# Patient Record
Sex: Female | Born: 1945 | Race: White | Hispanic: No | Smoking: Current every day smoker
Health system: Southern US, Community
[De-identification: ages and names within clinical notes are randomized; demographics above are authoritative.]

## PROBLEM LIST (undated history)

## (undated) DIAGNOSIS — I5189 Other ill-defined heart diseases: Secondary | ICD-10-CM

## (undated) DIAGNOSIS — F11921 Opioid use, unspecified with intoxication delirium: Secondary | ICD-10-CM

## (undated) DIAGNOSIS — I2699 Other pulmonary embolism without acute cor pulmonale: Secondary | ICD-10-CM

## (undated) DIAGNOSIS — R9431 Abnormal electrocardiogram [ECG] [EKG]: Secondary | ICD-10-CM

## (undated) DIAGNOSIS — IMO0002 Reserved for concepts with insufficient information to code with codable children: Secondary | ICD-10-CM

## (undated) DIAGNOSIS — M810 Age-related osteoporosis without current pathological fracture: Secondary | ICD-10-CM

## (undated) DIAGNOSIS — I10 Essential (primary) hypertension: Secondary | ICD-10-CM

## (undated) DIAGNOSIS — K7460 Unspecified cirrhosis of liver: Secondary | ICD-10-CM

## (undated) DIAGNOSIS — K8020 Calculus of gallbladder without cholecystitis without obstruction: Secondary | ICD-10-CM

## (undated) DIAGNOSIS — E079 Disorder of thyroid, unspecified: Secondary | ICD-10-CM

## (undated) DIAGNOSIS — E876 Hypokalemia: Secondary | ICD-10-CM

## (undated) DIAGNOSIS — Z720 Tobacco use: Secondary | ICD-10-CM

## (undated) DIAGNOSIS — E119 Type 2 diabetes mellitus without complications: Secondary | ICD-10-CM

## (undated) DIAGNOSIS — D649 Anemia, unspecified: Secondary | ICD-10-CM

## (undated) DIAGNOSIS — I341 Nonrheumatic mitral (valve) prolapse: Secondary | ICD-10-CM

## (undated) DIAGNOSIS — E785 Hyperlipidemia, unspecified: Secondary | ICD-10-CM

## (undated) DIAGNOSIS — R0902 Hypoxemia: Secondary | ICD-10-CM

## (undated) HISTORY — PX: THYROID SURGERY: SHX805

---

## 2001-07-08 ENCOUNTER — Ambulatory Visit (HOSPITAL_COMMUNITY): Admission: RE | Admit: 2001-07-08 | Discharge: 2001-07-08 | Payer: Self-pay

## 2005-03-15 ENCOUNTER — Ambulatory Visit (HOSPITAL_COMMUNITY): Admission: RE | Admit: 2005-03-15 | Discharge: 2005-03-15 | Payer: Self-pay | Admitting: Family Medicine

## 2005-04-02 ENCOUNTER — Ambulatory Visit (HOSPITAL_COMMUNITY): Admission: RE | Admit: 2005-04-02 | Discharge: 2005-04-02 | Payer: Self-pay | Admitting: Family Medicine

## 2006-06-12 ENCOUNTER — Ambulatory Visit (HOSPITAL_COMMUNITY): Admission: RE | Admit: 2006-06-12 | Discharge: 2006-06-12 | Payer: Self-pay | Admitting: Family Medicine

## 2007-10-19 ENCOUNTER — Ambulatory Visit (HOSPITAL_COMMUNITY): Admission: RE | Admit: 2007-10-19 | Discharge: 2007-10-19 | Payer: Self-pay | Admitting: Family Medicine

## 2008-06-03 ENCOUNTER — Ambulatory Visit (HOSPITAL_COMMUNITY): Admission: RE | Admit: 2008-06-03 | Discharge: 2008-06-03 | Payer: Self-pay | Admitting: Family Medicine

## 2008-12-30 ENCOUNTER — Ambulatory Visit (HOSPITAL_COMMUNITY): Admission: RE | Admit: 2008-12-30 | Discharge: 2008-12-30 | Payer: Self-pay | Admitting: Family Medicine

## 2009-01-06 ENCOUNTER — Ambulatory Visit (HOSPITAL_COMMUNITY): Admission: RE | Admit: 2009-01-06 | Discharge: 2009-01-06 | Payer: Self-pay | Admitting: Family Medicine

## 2009-02-24 ENCOUNTER — Ambulatory Visit (HOSPITAL_COMMUNITY): Admission: RE | Admit: 2009-02-24 | Discharge: 2009-02-24 | Payer: Self-pay | Admitting: Family Medicine

## 2009-03-02 ENCOUNTER — Encounter (HOSPITAL_COMMUNITY): Admission: RE | Admit: 2009-03-02 | Discharge: 2009-04-01 | Payer: Self-pay | Admitting: Family Medicine

## 2011-01-13 ENCOUNTER — Encounter: Payer: Self-pay | Admitting: General Practice

## 2012-01-30 ENCOUNTER — Other Ambulatory Visit (HOSPITAL_COMMUNITY): Payer: Self-pay | Admitting: Physician Assistant

## 2012-01-30 DIAGNOSIS — N6320 Unspecified lump in the left breast, unspecified quadrant: Secondary | ICD-10-CM

## 2012-02-12 ENCOUNTER — Other Ambulatory Visit (HOSPITAL_COMMUNITY): Payer: Self-pay | Admitting: Physician Assistant

## 2012-02-12 ENCOUNTER — Encounter (HOSPITAL_COMMUNITY): Payer: Self-pay

## 2012-02-12 ENCOUNTER — Ambulatory Visit (HOSPITAL_COMMUNITY)
Admission: RE | Admit: 2012-02-12 | Discharge: 2012-02-12 | Disposition: A | Discharge status: Home / Self Care | Payer: Medicare Other | Source: Ambulatory Visit | Attending: Physician Assistant | Admitting: Physician Assistant

## 2012-02-12 DIAGNOSIS — N63 Unspecified lump in unspecified breast: Secondary | ICD-10-CM | POA: Insufficient documentation

## 2012-02-12 DIAGNOSIS — N6320 Unspecified lump in the left breast, unspecified quadrant: Secondary | ICD-10-CM

## 2012-12-11 ENCOUNTER — Other Ambulatory Visit (HOSPITAL_COMMUNITY): Payer: Self-pay | Admitting: Physician Assistant

## 2012-12-11 DIAGNOSIS — R609 Edema, unspecified: Secondary | ICD-10-CM

## 2012-12-14 ENCOUNTER — Ambulatory Visit (HOSPITAL_COMMUNITY)
Admission: RE | Admit: 2012-12-14 | Discharge: 2012-12-14 | Disposition: A | Discharge status: Home / Self Care | Payer: Medicare Other | Source: Ambulatory Visit | Attending: Physician Assistant | Admitting: Physician Assistant

## 2012-12-14 DIAGNOSIS — R609 Edema, unspecified: Secondary | ICD-10-CM

## 2012-12-23 DIAGNOSIS — K8020 Calculus of gallbladder without cholecystitis without obstruction: Secondary | ICD-10-CM

## 2012-12-23 HISTORY — DX: Calculus of gallbladder without cholecystitis without obstruction: K80.20

## 2013-08-24 ENCOUNTER — Other Ambulatory Visit (HOSPITAL_COMMUNITY): Payer: Self-pay | Admitting: Physician Assistant

## 2013-08-24 DIAGNOSIS — R7989 Other specified abnormal findings of blood chemistry: Secondary | ICD-10-CM

## 2013-09-22 DIAGNOSIS — I341 Nonrheumatic mitral (valve) prolapse: Secondary | ICD-10-CM

## 2013-09-22 DIAGNOSIS — I5189 Other ill-defined heart diseases: Secondary | ICD-10-CM

## 2013-09-22 HISTORY — DX: Nonrheumatic mitral (valve) prolapse: I34.1

## 2013-09-22 HISTORY — DX: Other ill-defined heart diseases: I51.89

## 2013-10-06 ENCOUNTER — Encounter (HOSPITAL_COMMUNITY): Payer: Self-pay | Admitting: Emergency Medicine

## 2013-10-06 ENCOUNTER — Inpatient Hospital Stay (HOSPITAL_COMMUNITY)
Admission: EM | Admit: 2013-10-06 | Discharge: 2013-10-08 | DRG: 640 | Disposition: A | Discharge status: Home / Self Care | Payer: Medicare PPO | Attending: Internal Medicine | Admitting: Internal Medicine

## 2013-10-06 ENCOUNTER — Emergency Department (HOSPITAL_COMMUNITY): Payer: Medicare PPO

## 2013-10-06 ENCOUNTER — Inpatient Hospital Stay (HOSPITAL_COMMUNITY): Payer: Medicare PPO

## 2013-10-06 DIAGNOSIS — I10 Essential (primary) hypertension: Secondary | ICD-10-CM | POA: Diagnosis present

## 2013-10-06 DIAGNOSIS — G9340 Encephalopathy, unspecified: Secondary | ICD-10-CM | POA: Diagnosis present

## 2013-10-06 DIAGNOSIS — E785 Hyperlipidemia, unspecified: Secondary | ICD-10-CM | POA: Diagnosis present

## 2013-10-06 DIAGNOSIS — IMO0002 Reserved for concepts with insufficient information to code with codable children: Secondary | ICD-10-CM | POA: Diagnosis present

## 2013-10-06 DIAGNOSIS — I519 Heart disease, unspecified: Secondary | ICD-10-CM | POA: Diagnosis present

## 2013-10-06 DIAGNOSIS — F17200 Nicotine dependence, unspecified, uncomplicated: Secondary | ICD-10-CM | POA: Diagnosis present

## 2013-10-06 DIAGNOSIS — K7689 Other specified diseases of liver: Secondary | ICD-10-CM | POA: Diagnosis present

## 2013-10-06 DIAGNOSIS — K8020 Calculus of gallbladder without cholecystitis without obstruction: Secondary | ICD-10-CM | POA: Diagnosis present

## 2013-10-06 DIAGNOSIS — G894 Chronic pain syndrome: Secondary | ICD-10-CM | POA: Diagnosis present

## 2013-10-06 DIAGNOSIS — R739 Hyperglycemia, unspecified: Secondary | ICD-10-CM | POA: Diagnosis present

## 2013-10-06 DIAGNOSIS — R9431 Abnormal electrocardiogram [ECG] [EKG]: Secondary | ICD-10-CM | POA: Diagnosis present

## 2013-10-06 DIAGNOSIS — J9600 Acute respiratory failure, unspecified whether with hypoxia or hypercapnia: Secondary | ICD-10-CM | POA: Diagnosis present

## 2013-10-06 DIAGNOSIS — R531 Weakness: Secondary | ICD-10-CM | POA: Diagnosis present

## 2013-10-06 DIAGNOSIS — E876 Hypokalemia: Principal | ICD-10-CM | POA: Diagnosis present

## 2013-10-06 DIAGNOSIS — E119 Type 2 diabetes mellitus without complications: Secondary | ICD-10-CM | POA: Diagnosis present

## 2013-10-06 DIAGNOSIS — R7401 Elevation of levels of liver transaminase levels: Secondary | ICD-10-CM | POA: Diagnosis present

## 2013-10-06 DIAGNOSIS — M1990 Unspecified osteoarthritis, unspecified site: Secondary | ICD-10-CM | POA: Diagnosis present

## 2013-10-06 DIAGNOSIS — T502X5A Adverse effect of carbonic-anhydrase inhibitors, benzothiadiazides and other diuretics, initial encounter: Secondary | ICD-10-CM | POA: Diagnosis present

## 2013-10-06 DIAGNOSIS — R5381 Other malaise: Secondary | ICD-10-CM

## 2013-10-06 DIAGNOSIS — I5189 Other ill-defined heart diseases: Secondary | ICD-10-CM

## 2013-10-06 DIAGNOSIS — M810 Age-related osteoporosis without current pathological fracture: Secondary | ICD-10-CM | POA: Diagnosis present

## 2013-10-06 DIAGNOSIS — J449 Chronic obstructive pulmonary disease, unspecified: Secondary | ICD-10-CM | POA: Diagnosis present

## 2013-10-06 DIAGNOSIS — R4182 Altered mental status, unspecified: Secondary | ICD-10-CM | POA: Diagnosis present

## 2013-10-06 DIAGNOSIS — J4489 Other specified chronic obstructive pulmonary disease: Secondary | ICD-10-CM | POA: Diagnosis present

## 2013-10-06 DIAGNOSIS — T40605A Adverse effect of unspecified narcotics, initial encounter: Secondary | ICD-10-CM | POA: Diagnosis present

## 2013-10-06 DIAGNOSIS — Z720 Tobacco use: Secondary | ICD-10-CM | POA: Diagnosis present

## 2013-10-06 DIAGNOSIS — E873 Alkalosis: Secondary | ICD-10-CM | POA: Diagnosis present

## 2013-10-06 DIAGNOSIS — R7402 Elevation of levels of lactic acid dehydrogenase (LDH): Secondary | ICD-10-CM | POA: Diagnosis present

## 2013-10-06 DIAGNOSIS — Z79899 Other long term (current) drug therapy: Secondary | ICD-10-CM

## 2013-10-06 DIAGNOSIS — R51 Headache: Secondary | ICD-10-CM | POA: Diagnosis present

## 2013-10-06 DIAGNOSIS — R0902 Hypoxemia: Secondary | ICD-10-CM | POA: Diagnosis present

## 2013-10-06 HISTORY — DX: Essential (primary) hypertension: I10

## 2013-10-06 HISTORY — DX: Reserved for concepts with insufficient information to code with codable children: IMO0002

## 2013-10-06 HISTORY — DX: Abnormal electrocardiogram (ECG) (EKG): R94.31

## 2013-10-06 HISTORY — DX: Hypokalemia: E87.6

## 2013-10-06 HISTORY — DX: Other ill-defined heart diseases: I51.89

## 2013-10-06 HISTORY — DX: Hyperlipidemia, unspecified: E78.5

## 2013-10-06 HISTORY — DX: Calculus of gallbladder without cholecystitis without obstruction: K80.20

## 2013-10-06 HISTORY — DX: Hypoxemia: R09.02

## 2013-10-06 HISTORY — DX: Age-related osteoporosis without current pathological fracture: M81.0

## 2013-10-06 HISTORY — DX: Nonrheumatic mitral (valve) prolapse: I34.1

## 2013-10-06 HISTORY — DX: Tobacco use: Z72.0

## 2013-10-06 LAB — COMPREHENSIVE METABOLIC PANEL
Albumin: 2.6 g/dL — ABNORMAL LOW (ref 3.5–5.2)
BUN: 4 mg/dL — ABNORMAL LOW (ref 6–23)
Calcium: 8.4 mg/dL (ref 8.4–10.5)
Creatinine, Ser: 0.92 mg/dL (ref 0.50–1.10)
Potassium: 2 mEq/L — CL (ref 3.5–5.1)
Total Protein: 6.7 g/dL (ref 6.0–8.3)

## 2013-10-06 LAB — CBC WITH DIFFERENTIAL/PLATELET
Eosinophils Relative: 1 % (ref 0–5)
HCT: 40.7 % (ref 36.0–46.0)
Hemoglobin: 13.9 g/dL (ref 12.0–15.0)
Lymphocytes Relative: 44 % (ref 12–46)
Lymphs Abs: 5 10*3/uL — ABNORMAL HIGH (ref 0.7–4.0)
MCV: 91.3 fL (ref 78.0–100.0)
Monocytes Absolute: 1.1 10*3/uL — ABNORMAL HIGH (ref 0.1–1.0)
RBC: 4.46 MIL/uL (ref 3.87–5.11)
WBC: 11.3 10*3/uL — ABNORMAL HIGH (ref 4.0–10.5)

## 2013-10-06 LAB — URINE MICROSCOPIC-ADD ON

## 2013-10-06 LAB — URINALYSIS, ROUTINE W REFLEX MICROSCOPIC
Bilirubin Urine: NEGATIVE
Glucose, UA: NEGATIVE mg/dL
Ketones, ur: NEGATIVE mg/dL
pH: 6.5 (ref 5.0–8.0)

## 2013-10-06 LAB — APTT: aPTT: 33 seconds (ref 24–37)

## 2013-10-06 LAB — PROTIME-INR
INR: 1.15 (ref 0.00–1.49)
Prothrombin Time: 14.5 seconds (ref 11.6–15.2)

## 2013-10-06 LAB — LIPASE, BLOOD: Lipase: 6 U/L — ABNORMAL LOW (ref 11–59)

## 2013-10-06 MED ORDER — MAGNESIUM SULFATE 40 MG/ML IJ SOLN
2.0000 g | INTRAMUSCULAR | Status: AC
  Administered 2013-10-06: 2 g via INTRAVENOUS
  Filled 2013-10-06: qty 50

## 2013-10-06 MED ORDER — ONDANSETRON HCL 4 MG/2ML IJ SOLN: 4.0000 mg | Freq: Four times a day (QID) | INTRAMUSCULAR | Status: DC | PRN

## 2013-10-06 MED ORDER — FLUOXETINE HCL 20 MG PO CAPS
20.0000 mg | ORAL_CAPSULE | Freq: Every morning | ORAL | Status: DC
  Administered 2013-10-07 – 2013-10-08 (×2): 20 mg via ORAL
  Filled 2013-10-06 (×2): qty 1

## 2013-10-06 MED ORDER — ONDANSETRON HCL 4 MG/2ML IJ SOLN: 4.0000 mg | Freq: Three times a day (TID) | INTRAMUSCULAR | Status: AC | PRN

## 2013-10-06 MED ORDER — SIMVASTATIN 10 MG PO TABS
5.0000 mg | ORAL_TABLET | Freq: Every day | ORAL | Status: DC
  Administered 2013-10-07: 5 mg via ORAL
  Filled 2013-10-06: qty 1

## 2013-10-06 MED ORDER — POTASSIUM CHLORIDE IN NACL 20-0.9 MEQ/L-% IV SOLN
INTRAVENOUS | Status: DC
  Administered 2013-10-07: 01:00:00 via INTRAVENOUS

## 2013-10-06 MED ORDER — POTASSIUM CHLORIDE CRYS ER 20 MEQ PO TBCR
40.0000 meq | EXTENDED_RELEASE_TABLET | Freq: Once | ORAL | Status: AC
  Administered 2013-10-06: 40 meq via ORAL
  Filled 2013-10-06: qty 2

## 2013-10-06 MED ORDER — METOPROLOL TARTRATE 25 MG PO TABS
25.0000 mg | ORAL_TABLET | Freq: Every day | ORAL | Status: DC
  Administered 2013-10-07 – 2013-10-08 (×2): 25 mg via ORAL
  Filled 2013-10-06 (×2): qty 1

## 2013-10-06 MED ORDER — OXYCODONE HCL 5 MG PO TABS
5.0000 mg | ORAL_TABLET | ORAL | Status: DC | PRN
  Administered 2013-10-07 (×2): 5 mg via ORAL
  Filled 2013-10-06 (×2): qty 1

## 2013-10-06 MED ORDER — POTASSIUM CHLORIDE 10 MEQ/100ML IV SOLN
10.0000 meq | Freq: Once | INTRAVENOUS | Status: AC
  Administered 2013-10-06: 10 meq via INTRAVENOUS
  Filled 2013-10-06: qty 100

## 2013-10-06 MED ORDER — LEVOTHYROXINE SODIUM 75 MCG PO TABS
175.0000 ug | ORAL_TABLET | Freq: Every day | ORAL | Status: DC
  Administered 2013-10-07 – 2013-10-08 (×2): 175 ug via ORAL
  Filled 2013-10-06 (×4): qty 1

## 2013-10-06 MED ORDER — ONDANSETRON HCL 4 MG PO TABS: 4.0000 mg | ORAL_TABLET | Freq: Four times a day (QID) | ORAL | Status: DC | PRN

## 2013-10-06 MED ORDER — SODIUM CHLORIDE 0.9 % IJ SOLN: 3.0000 mL | INTRAMUSCULAR | Status: DC | PRN

## 2013-10-06 MED ORDER — SODIUM CHLORIDE 0.9 % IV SOLN: 250.0000 mL | INTRAVENOUS | Status: DC | PRN

## 2013-10-06 MED ORDER — SODIUM CHLORIDE 0.9 % IJ SOLN: 3.0000 mL | Freq: Two times a day (BID) | INTRAMUSCULAR | Status: DC

## 2013-10-06 MED ORDER — POTASSIUM CHLORIDE 10 MEQ/100ML IV SOLN
10.0000 meq | INTRAVENOUS | Status: AC
  Administered 2013-10-06 – 2013-10-07 (×3): 10 meq via INTRAVENOUS
  Filled 2013-10-06 (×2): qty 100

## 2013-10-06 MED ORDER — POTASSIUM CHLORIDE 10 MEQ/100ML IV SOLN: 10.0000 meq | INTRAVENOUS | Status: DC

## 2013-10-06 MED ORDER — ENOXAPARIN SODIUM 30 MG/0.3ML ~~LOC~~ SOLN
30.0000 mg | SUBCUTANEOUS | Status: DC
  Administered 2013-10-07 – 2013-10-08 (×2): 30 mg via SUBCUTANEOUS
  Filled 2013-10-06 (×2): qty 0.3

## 2013-10-06 MED ORDER — ALPRAZOLAM 0.5 MG PO TABS
0.5000 mg | ORAL_TABLET | Freq: Two times a day (BID) | ORAL | Status: DC | PRN
  Administered 2013-10-07: 0.5 mg via ORAL
  Filled 2013-10-06: qty 1

## 2013-10-06 NOTE — ED Notes (Signed)
CRITICAL VALUE ALERT  Critical value received: Potassium 2.0.  CO2 43  Date of notification:  10/06/13  Time of notification:  2111  Critical value read back:yes  Nurse who received alert:  bkn  MD notified (1st page):  2115  Time of first page:    MD notified (2nd page):  Time of second page:  Responding MD:  Hyacinth Meeker  Time MD responded:  2115

## 2013-10-06 NOTE — H&P (Addendum)
PCP:   Lenise Herald, PA-C   Chief Complaint:  jitteriness  HPI: 67 year old female with a history of hypertension, hyperlipidemia who was brought to the hospital after patient has been having jitteriness and weakness with some mental status changes as per patient's son. Patient is very drowsy in the ED and her O2 sats were dropping around 85. In the ED patient was found to have severe hypokalemia with a potassium of 2.0, also patient complained of some abdominal pain. She does have elevated LFts, with mild elevation of alkaline phosphatase. She denies chest pain no shortness of breath. EKG in the ED shows QTC prolonged more than 600 ms. Patient has been taking Lasix 80 mg by mouth daily for leg swelling and also due to when necessary for dyspnea. Patient is smoker and smoked 2 packs per day. Patient was also started on fentanyl patch 12 mcg every 72 hours and has been taking oxycodone 20 mg tablet every 4 hours.  Allergies:   Allergies  Allergen Reactions  . Aspirin       Past Medical History  Diagnosis Date  . Hypertension   . Hyperlipidemia   . DDD (degenerative disc disease)   . Osteoporosis     Past Surgical History  Procedure Laterality Date  . Thyroid surgery      Prior to Admission medications   Medication Sig Start Date End Date Taking? Authorizing Provider  albuterol (PROVENTIL HFA;VENTOLIN HFA) 108 (90 BASE) MCG/ACT inhaler Inhale 2 puffs into the lungs every 6 (six) hours as needed for wheezing.   Yes Historical Provider, MD  ALPRAZolam Prudy Feeler) 0.5 MG tablet Take 0.5 mg by mouth 2 (two) times daily as needed. For anxiety 10/04/13  Yes Historical Provider, MD  fentaNYL (DURAGESIC - DOSED MCG/HR) 12 MCG/HR Place 1 patch onto the skin every 3 (three) days. 09/09/13  Yes Historical Provider, MD  FLUoxetine (PROZAC) 20 MG capsule Take 20 mg by mouth every morning.  10/01/13  Yes Historical Provider, MD  furosemide (LASIX) 40 MG tablet Take 2 tablets by mouth daily.  08/18/13   Yes Historical Provider, MD  levothyroxine (SYNTHROID, LEVOTHROID) 175 MCG tablet Take 175 mcg by mouth daily before breakfast.  09/28/13  Yes Historical Provider, MD  metoprolol tartrate (LOPRESSOR) 25 MG tablet Take 25 mg by mouth daily. Prescribed one tablet twice daily 08/24/13  Yes Historical Provider, MD  omeprazole (PRILOSEC) 40 MG capsule Take 40 mg by mouth daily before breakfast.  09/28/13  Yes Historical Provider, MD  Oxycodone HCl 20 MG TABS Take 1-2 tablets by mouth every 4 (four) hours as needed. For pain 10/01/13  Yes Historical Provider, MD  pravastatin (PRAVACHOL) 20 MG tablet Take 1 tablet by mouth daily. 08/12/13   Historical Provider, MD    Social History:  reports that she has never smoked. She does not have any smokeless tobacco history on file. She reports that she does not drink alcohol or use illicit drugs.  History reviewed. No pertinent family history.   All the positives are listed in BOLD  Review of Systems:  HEENT: Headache, blurred vision, runny nose, sore throat Neck: Hypothyroidism, hyperthyroidism,,lymphadenopathy Chest : Shortness of breath, history of COPD, Asthma Heart : Chest pain, history of coronary arterey disease GI:  Nausea, vomiting, diarrhea, constipation, GERD GU: Dysuria, urgency, frequency of urination, hematuria Neuro: Stroke, seizures, syncope Psych: Depression, anxiety, hallucinations   Physical Exam: Blood pressure 103/59, pulse 67, temperature 98.2 F (36.8 C), temperature source Oral, resp. rate 18, height 5\' 3"  (1.6  m), weight 71.215 kg (157 lb), SpO2 92.00%. Constitutional:   Patient is a well-developed and well-nourished female* in no acute distress and cooperative with exam. Head: Normocephalic and atraumatic Mouth: Mucus membranes moist Eyes: PERRL, EOMI, conjunctivae normal Neck: Supple, No Thyromegaly Cardiovascular: RRR, S1 normal, S2 normal Pulmonary/Chest: CTAB, no wheezes, rales, or rhonchi Abdominal: Soft. Mild  epigastric tenderness on palpation, non-distended, bowel sounds are normal, no masses, organomegaly, or guarding present.  Neurological: A&O x3, Strenght is normal and symmetric bilaterally, cranial nerve II-XII are grossly intact, no focal motor deficit, sensory intact to light touch bilaterally.  Extremities : No Cyanosis, Clubbing or Edema   Labs on Admission:  Results for orders placed during the hospital encounter of 10/06/13 (from the past 48 hour(s))  COMPREHENSIVE METABOLIC PANEL     Status: Abnormal   Collection Time    10/06/13  8:37 PM      Result Value Range   Sodium 136  135 - 145 mEq/L   Potassium 2.0 (*) 3.5 - 5.1 mEq/L   Comment: CRITICAL RESULT CALLED TO, READ BACK BY AND VERIFIED WITH:     B. NORMAN AT 2112 ON 10/06/13 BY S. VANHOORNE   Chloride 87 (*) 96 - 112 mEq/L   CO2 43 (*) 19 - 32 mEq/L   Comment: CRITICAL RESULT CALLED TO, READ BACK BY AND VERIFIED WITH:     B. NORMAN AT 2112 ON 10/06/13 BY S. VANHOORNE   Glucose, Bld 140 (*) 70 - 99 mg/dL   BUN 4 (*) 6 - 23 mg/dL   Creatinine, Ser 0.45  0.50 - 1.10 mg/dL   Calcium 8.4  8.4 - 40.9 mg/dL   Total Protein 6.7  6.0 - 8.3 g/dL   Albumin 2.6 (*) 3.5 - 5.2 g/dL   AST 51 (*) 0 - 37 U/L   ALT 15  0 - 35 U/L   Alkaline Phosphatase 153 (*) 39 - 117 U/L   Total Bilirubin 1.2  0.3 - 1.2 mg/dL   GFR calc non Af Amer 62 (*) >90 mL/min   GFR calc Af Amer 72 (*) >90 mL/min   Comment: (NOTE)     The eGFR has been calculated using the CKD EPI equation.     This calculation has not been validated in all clinical situations.     eGFR's persistently <90 mL/min signify possible Chronic Kidney     Disease.  CBC WITH DIFFERENTIAL     Status: Abnormal   Collection Time    10/06/13  8:37 PM      Result Value Range   WBC 11.3 (*) 4.0 - 10.5 K/uL   RBC 4.46  3.87 - 5.11 MIL/uL   Hemoglobin 13.9  12.0 - 15.0 g/dL   HCT 81.1  91.4 - 78.2 %   MCV 91.3  78.0 - 100.0 fL   MCH 31.2  26.0 - 34.0 pg   MCHC 34.2  30.0 - 36.0 g/dL    RDW 95.6  21.3 - 08.6 %   Platelets 298  150 - 400 K/uL   Neutrophils Relative % 45  43 - 77 %   Neutro Abs 5.1  1.7 - 7.7 K/uL   Lymphocytes Relative 44  12 - 46 %   Lymphs Abs 5.0 (*) 0.7 - 4.0 K/uL   Monocytes Relative 9  3 - 12 %   Monocytes Absolute 1.1 (*) 0.1 - 1.0 K/uL   Eosinophils Relative 1  0 - 5 %   Eosinophils Absolute 0.1  0.0 - 0.7 K/uL   Basophils Relative 1  0 - 1 %   Basophils Absolute 0.1  0.0 - 0.1 K/uL  TROPONIN I     Status: None   Collection Time    10/06/13  8:37 PM      Result Value Range   Troponin I <0.30  <0.30 ng/mL   Comment:            Due to the release kinetics of cTnI,     a negative result within the first hours     of the onset of symptoms does not rule out     myocardial infarction with certainty.     If myocardial infarction is still suspected,     repeat the test at appropriate intervals.  PROTIME-INR     Status: None   Collection Time    10/06/13  8:37 PM      Result Value Range   Prothrombin Time 14.5  11.6 - 15.2 seconds   INR 1.15  0.00 - 1.49  APTT     Status: None   Collection Time    10/06/13  8:37 PM      Result Value Range   aPTT 33  24 - 37 seconds  URINALYSIS, ROUTINE W REFLEX MICROSCOPIC     Status: Abnormal   Collection Time    10/06/13  9:25 PM      Result Value Range   Color, Urine YELLOW  YELLOW   APPearance CLEAR  CLEAR   Specific Gravity, Urine 1.010  1.005 - 1.030   pH 6.5  5.0 - 8.0   Glucose, UA NEGATIVE  NEGATIVE mg/dL   Hgb urine dipstick NEGATIVE  NEGATIVE   Bilirubin Urine NEGATIVE  NEGATIVE   Ketones, ur NEGATIVE  NEGATIVE mg/dL   Protein, ur NEGATIVE  NEGATIVE mg/dL   Urobilinogen, UA 1.0  0.0 - 1.0 mg/dL   Nitrite NEGATIVE  NEGATIVE   Leukocytes, UA TRACE (*) NEGATIVE  URINE MICROSCOPIC-ADD ON     Status: Abnormal   Collection Time    10/06/13  9:25 PM      Result Value Range   Squamous Epithelial / LPF MANY (*) RARE   WBC, UA 3-6  <3 WBC/hpf   RBC / HPF 3-6  <3 RBC/hpf   Bacteria, UA FEW (*)  RARE    Radiological Exams on Admission: Ct Head Wo Contrast  10/06/2013   CLINICAL DATA:  hypertension with headache  EXAM: CT HEAD WITHOUT CONTRAST  TECHNIQUE: Contiguous axial images were obtained from the base of the skull through the vertex without intravenous contrast. Study was obtained within 24 hr of patient's arrival at the emergency department  COMPARISON:  None.  FINDINGS: There is mild diffuse atrophy. There is no mass, hemorrhage, extra-axial fluid collection, or midline shift. There is slight small vessel disease in the centra semiovale bilaterally. Gray-white compartments are otherwise normal. There is no demonstrable acute infarct.  Bony calvarium appears intact. Mastoids on the left are clear. There is opacification of several inferior mastoid air cells on the right.  IMPRESSION: Atrophy with mild small vessel disease. No intracranial mass, hemorrhage, or acute appearing infarct. Opacification of several inferior mastoids air cells on the right.   Electronically Signed   By: Bretta Bang M.D.   On: 10/06/2013 21:08    Assessment/Plan Active Problems:   Hypokalemia   Weakness   Altered mental status    Hypokalemia Patient potassium is low due to high-dose Lasix 80 mg  once a day and also when necessary albuterol. Will replace potassium by giving IV KCl 10  milliequivalents x4, check serum potassium the morning  Transaminitis Mild elevation of liver enzymes, she does have mild epigastric tenderness on palpation. Lipase is ordered but pending at this time. We'll obtain abdominal ultrasound in the morning  Somnolence/hypoxia Most likely  secondary to opiates patient has fentanyl patch and also takes oxycodone 20 mg every 4 hours Will DC the fentanyl patch at this time. Gave oxycodone 5 mg every 4 hours when necessary for pain  Obtain chest x-ray, ABG. CT head is negative, will obtain MRI brain  Abdominal pain Will obtain abdominal ultrasound. Mild elevation of  LFT's  DVT prophylaxis Lovenox    Code status: Full code  Family discussion: Discussed with son at bedside   Time Spent on Admission: 70 min  LAMA,GAGAN S [facility] Pager: [phone] 10/06/2013, 10:56 PM  If 7PM-7AM, please contact night-coverage  [email or web address]  Password [email or web address]

## 2013-10-06 NOTE — ED Notes (Signed)
Pt's son states pt has been dropping things, has unsteady gait and does not look good to him. Pt denies any pain at this time.

## 2013-10-06 NOTE — ED Provider Notes (Signed)
CSN: [id number]     Arrival date & time 10/06/13  1921 History   First MD Initiated Contact with Patient 10/06/13 2006     Chief Complaint  Patient presents with  . Tremors  . Gait Problem   (Consider location/radiation/quality/duration/timing/severity/associated sxs/prior Treatment) HPI Comments: Pt states that she has been dropping things sine Friday - the son provides part of the history - he felt that she appeared jittery - today she awoke at noon - she was "jittery" and almost fell a few times.  She couldn't light her cigarette b/c of not being able to hold it.  Dropping her glass of water as well.  This was in the Right hand.  Son doesn't think she "looks right".  Sx are constant, nothing makes better or worse.  Denies f/c/n/v/diarrhea.  Urine was red this AM.  She has no cough, sob, cp, back pain.  She has a headache.  Vision is unchanged.  Son states also has a staggering walk in last 2 days.  They state that this started after seeing her doctor on Friday - had fentanyl patch in past - still using it.  Prozac was started as well - first time ever.  The history is provided by the patient and a relative.    Past Medical History  Diagnosis Date  . Hypertension   . Hyperlipidemia   . DDD (degenerative disc disease)   . Osteoporosis    Past Surgical History  Procedure Laterality Date  . Thyroid surgery     History reviewed. No pertinent family history. History  Substance Use Topics  . Smoking status: Never Smoker   . Smokeless tobacco: Not on file  . Alcohol Use: No   OB History   Grav Para Term Preterm Abortions TAB SAB Ect Mult Living                 Review of Systems  All other systems reviewed and are negative.    Allergies  Aspirin  Home Medications   Current Outpatient Rx  Name  Route  Sig  Dispense  Refill  . albuterol (PROVENTIL HFA;VENTOLIN HFA) 108 (90 BASE) MCG/ACT inhaler   Inhalation   Inhale 2 puffs into the lungs every 6 (six) hours as needed for  wheezing.         Marland Kitchen ALPRAZolam (XANAX) 0.5 MG tablet   Oral   Take 0.5 mg by mouth 2 (two) times daily as needed. For anxiety         . fentaNYL (DURAGESIC - DOSED MCG/HR) 12 MCG/HR   Transdermal   Place 1 patch onto the skin every 3 (three) days.         Marland Kitchen FLUoxetine (PROZAC) 20 MG capsule   Oral   Take 20 mg by mouth every morning.          . furosemide (LASIX) 40 MG tablet   Oral   Take 2 tablets by mouth daily.          Marland Kitchen levothyroxine (SYNTHROID, LEVOTHROID) 175 MCG tablet   Oral   Take 175 mcg by mouth daily before breakfast.          . metoprolol tartrate (LOPRESSOR) 25 MG tablet   Oral   Take 25 mg by mouth daily. Prescribed one tablet twice daily         . omeprazole (PRILOSEC) 40 MG capsule   Oral   Take 40 mg by mouth daily before breakfast.          .  Oxycodone HCl 20 MG TABS   Oral   Take 1-2 tablets by mouth every 4 (four) hours as needed. For pain         . pravastatin (PRAVACHOL) 20 MG tablet   Oral   Take 1 tablet by mouth daily.          BP 125/58  Pulse 67  Temp(Src) 98.2 F (36.8 C) (Oral)  Resp 18  Ht 5\' 3"  (1.6 m)  Wt 157 lb (71.215 kg)  BMI 27.82 kg/m2  SpO2 98% Physical Exam  Nursing note and vitals reviewed. Constitutional: She appears well-developed and well-nourished. No distress.  HENT:  Head: Normocephalic and atraumatic.  Mouth/Throat: Oropharynx is clear and moist. No oropharyngeal exudate.  MM dry  Eyes: Conjunctivae and EOM are normal. Pupils are equal, round, and reactive to light. Right eye exhibits no discharge. Left eye exhibits no discharge. No scleral icterus.  Neck: Normal range of motion. Neck supple. No JVD present. No thyromegaly present.  Cardiovascular: Normal rate, regular rhythm and intact distal pulses.  Exam reveals no gallop and no friction rub.   Murmur ( systolic) heard. Pulmonary/Chest: Effort normal and breath sounds normal. No respiratory distress. She has no wheezes. She has no  rales.  Abdominal: Soft. Bowel sounds are normal. She exhibits no distension and no mass. There is no tenderness.  Musculoskeletal: Normal range of motion. She exhibits edema ( scant bilateral symmetrical). She exhibits no tenderness.  Lymphadenopathy:    She has no cervical adenopathy.  Neurological: She is alert.  Pt has some limb jerking which appears involuntary.  She has baseline speech according to son, normal strength in all 4 extremities, normal CN 3-12, normal FNF, no pronator drift, normal rapid alternating movements.  Skin: Skin is warm and dry. No rash noted. No erythema.  Psychiatric: She has a normal mood and affect. Her behavior is normal.    ED Course  Procedures (including critical care time) Labs Review Labs Reviewed  COMPREHENSIVE METABOLIC PANEL - Abnormal; Notable for the following:    Potassium 2.0 (*)    Chloride 87 (*)    CO2 43 (*)    Glucose, Bld 140 (*)    BUN 4 (*)    Albumin 2.6 (*)    AST 51 (*)    Alkaline Phosphatase 153 (*)    GFR calc non Af Amer 62 (*)    GFR calc Af Amer 72 (*)    All other components within normal limits  CBC WITH DIFFERENTIAL - Abnormal; Notable for the following:    WBC 11.3 (*)    Lymphs Abs 5.0 (*)    Monocytes Absolute 1.1 (*)    All other components within normal limits  URINALYSIS, ROUTINE W REFLEX MICROSCOPIC - Abnormal; Notable for the following:    Leukocytes, UA TRACE (*)    All other components within normal limits  URINE MICROSCOPIC-ADD ON - Abnormal; Notable for the following:    Squamous Epithelial / LPF MANY (*)    Bacteria, UA FEW (*)    All other components within normal limits  URINE CULTURE  TROPONIN I  PROTIME-INR  APTT   Imaging Review Ct Head Wo Contrast  10/06/2013   CLINICAL DATA:  hypertension with headache  EXAM: CT HEAD WITHOUT CONTRAST  TECHNIQUE: Contiguous axial images were obtained from the base of the skull through the vertex without intravenous contrast. Study was obtained within 24  hr of patient's arrival at the emergency department  COMPARISON:  None.  FINDINGS: There is mild diffuse atrophy. There is no mass, hemorrhage, extra-axial fluid collection, or midline shift. There is slight small vessel disease in the centra semiovale bilaterally. Gray-white compartments are otherwise normal. There is no demonstrable acute infarct.  Bony calvarium appears intact. Mastoids on the left are clear. There is opacification of several inferior mastoid air cells on the right.  IMPRESSION: Atrophy with mild small vessel disease. No intracranial mass, hemorrhage, or acute appearing infarct. Opacification of several inferior mastoids air cells on the right.   Electronically Signed   By: Bretta Bang M.D.   On: 10/06/2013 21:08    EKG Interpretation   None       MDM   1. Hypokalemia    At this time the etiology of the patient's symptoms is unknown, she has started new medications recently which is a potential cause however this could also be related to stroke, electrolytes, severe dehydration, urinary infection et Karie Soda. Workup started, labs, x-rays CT scan, EKG.  ED ECG REPORT  I personally interpreted this EKG   Date: 10/06/2013   Rate: 65  Rhythm: normal sinus rhythm  QRS Axis: left  Intervals: PR prolonged  ST/T Wave abnormalities: nonspecific ST/T changes  Conduction Disutrbances:first-degree A-V block  and nonspecific intraventricular conduction delay  Narrative Interpretation: Prolonged QT as well > 600  Old EKG Reviewed: none available  The pt's K was < 2.0, I have to assume that her weakness and lack of coordination is from this source.  Her prolonged QT would also be explained by this.  VS normal, pt needs IV K and admission.  No other acute findings on CT.  D/w Dr. Sharl Ma who will admit.  The pt has had severe electrolyte disturbance and has required cardiac monitoring b/c of severe prolongation of the QT interval, replacment with both IV Magnesium as well as  potassium.  She will need admission to the hospital.  CRITICAL CARE Performed by: Vida Roller Total critical care time: 35 Critical care time was exclusive of separately billable procedures and treating other patients. Critical care was necessary to treat or prevent imminent or life-threatening deterioration. Critical care was time spent personally by me on the following activities: development of treatment plan with patient and/or surrogate as well as nursing, discussions with consultants, evaluation of patient's response to treatment, examination of patient, obtaining history from patient or surrogate, ordering and performing treatments and interventions, ordering and review of laboratory studies, ordering and review of radiographic studies, pulse oximetry and re-evaluation of patient's condition.   Vida Roller, MD 10/06/13 [phone]

## 2013-10-07 ENCOUNTER — Encounter (HOSPITAL_COMMUNITY): Payer: Self-pay | Admitting: Internal Medicine

## 2013-10-07 ENCOUNTER — Inpatient Hospital Stay (HOSPITAL_COMMUNITY): Payer: Medicare PPO

## 2013-10-07 DIAGNOSIS — E873 Alkalosis: Secondary | ICD-10-CM | POA: Diagnosis present

## 2013-10-07 DIAGNOSIS — Z720 Tobacco use: Secondary | ICD-10-CM

## 2013-10-07 DIAGNOSIS — I359 Nonrheumatic aortic valve disorder, unspecified: Secondary | ICD-10-CM

## 2013-10-07 DIAGNOSIS — I519 Heart disease, unspecified: Secondary | ICD-10-CM

## 2013-10-07 DIAGNOSIS — R739 Hyperglycemia, unspecified: Secondary | ICD-10-CM | POA: Diagnosis present

## 2013-10-07 DIAGNOSIS — R0902 Hypoxemia: Secondary | ICD-10-CM

## 2013-10-07 DIAGNOSIS — R9431 Abnormal electrocardiogram [ECG] [EKG]: Secondary | ICD-10-CM | POA: Diagnosis present

## 2013-10-07 HISTORY — DX: Hypoxemia: R09.02

## 2013-10-07 HISTORY — DX: Abnormal electrocardiogram (ECG) (EKG): R94.31

## 2013-10-07 HISTORY — DX: Tobacco use: Z72.0

## 2013-10-07 LAB — BLOOD GAS, ARTERIAL
Acid-Base Excess: 14 mmol/L — ABNORMAL HIGH (ref 0.0–2.0)
Bicarbonate: 38.4 mEq/L — ABNORMAL HIGH (ref 20.0–24.0)
Drawn by: 38235
FIO2: 0.28 %
TCO2: 33.4 mmol/L (ref 0–100)
pCO2 arterial: 50.1 mmHg — ABNORMAL HIGH (ref 35.0–45.0)
pH, Arterial: 7.497 — ABNORMAL HIGH (ref 7.350–7.450)

## 2013-10-07 LAB — BASIC METABOLIC PANEL
BUN: 3 mg/dL — ABNORMAL LOW (ref 6–23)
Calcium: 8.1 mg/dL — ABNORMAL LOW (ref 8.4–10.5)
Calcium: 8 mg/dL — ABNORMAL LOW (ref 8.4–10.5)
Calcium: 8.2 mg/dL — ABNORMAL LOW (ref 8.4–10.5)
Chloride: 94 mEq/L — ABNORMAL LOW (ref 96–112)
Creatinine, Ser: 0.76 mg/dL (ref 0.50–1.10)
Creatinine, Ser: 0.72 mg/dL (ref 0.50–1.10)
GFR calc Af Amer: >90 mL/min (ref >90)
GFR calc Af Amer: >90 mL/min (ref >90)
GFR calc Af Amer: >90 mL/min (ref >90)
GFR calc non Af Amer: 84 mL/min — ABNORMAL LOW (ref >90)
GFR calc non Af Amer: 86 mL/min — ABNORMAL LOW (ref >90)
GFR calc non Af Amer: 86 mL/min — ABNORMAL LOW (ref >90)
Glucose, Bld: 173 mg/dL — ABNORMAL HIGH (ref 70–99)
Potassium: 2.4 mEq/L — CL (ref 3.5–5.1)
Potassium: 2.6 mEq/L — CL (ref 3.5–5.1)
Sodium: 139 mEq/L (ref 135–145)
Sodium: 137 mEq/L (ref 135–145)
Sodium: 139 mEq/L (ref 135–145)

## 2013-10-07 LAB — CBC
Hemoglobin: 12.5 g/dL (ref 12.0–15.0)
MCH: 30.9 pg (ref 26.0–34.0)
MCV: 89.6 fL (ref 78.0–100.0)
RBC: 4.05 MIL/uL (ref 3.87–5.11)
WBC: 8.6 10*3/uL (ref 4.0–10.5)

## 2013-10-07 LAB — COMPREHENSIVE METABOLIC PANEL
ALT: 12 U/L (ref 0–35)
AST: 41 U/L — ABNORMAL HIGH (ref 0–37)
CO2: 39 mEq/L — ABNORMAL HIGH (ref 19–32)
Calcium: 8 mg/dL — ABNORMAL LOW (ref 8.4–10.5)
Chloride: 93 mEq/L — ABNORMAL LOW (ref 96–112)
GFR calc Af Amer: 77 mL/min — ABNORMAL LOW (ref >90)
GFR calc non Af Amer: 66 mL/min — ABNORMAL LOW (ref >90)
Glucose, Bld: 139 mg/dL — ABNORMAL HIGH (ref 70–99)
Sodium: 139 mEq/L (ref 135–145)
Total Bilirubin: 1.2 mg/dL (ref 0.3–1.2)

## 2013-10-07 LAB — TROPONIN I
Troponin I: <0.3 ng/mL (ref <0.30)
Troponin I: <0.3 ng/mL (ref <0.30)

## 2013-10-07 LAB — MAGNESIUM
Magnesium: 2.3 mg/dL (ref 1.5–2.5)
Magnesium: 2.3 mg/dL (ref 1.5–2.5)

## 2013-10-07 LAB — TSH: TSH: 1.044 u[IU]/mL (ref 0.350–4.500)

## 2013-10-07 LAB — HEMOGLOBIN A1C: Hgb A1c MFr Bld: 6.5 % — ABNORMAL HIGH (ref <5.7)

## 2013-10-07 MED ORDER — POTASSIUM CHLORIDE CRYS ER 20 MEQ PO TBCR
40.0000 meq | EXTENDED_RELEASE_TABLET | ORAL | Status: AC
  Administered 2013-10-07 (×2): 40 meq via ORAL
  Filled 2013-10-07 (×2): qty 2

## 2013-10-07 MED ORDER — POTASSIUM CHLORIDE CRYS ER 20 MEQ PO TBCR
40.0000 meq | EXTENDED_RELEASE_TABLET | ORAL | Status: AC
  Administered 2013-10-07 – 2013-10-08 (×3): 40 meq via ORAL
  Filled 2013-10-07 (×2): qty 2

## 2013-10-07 MED ORDER — POTASSIUM CHLORIDE 10 MEQ/100ML IV SOLN
10.0000 meq | INTRAVENOUS | Status: AC
  Administered 2013-10-07 (×4): 10 meq via INTRAVENOUS
  Filled 2013-10-07 (×2): qty 100

## 2013-10-07 MED ORDER — AMILORIDE HCL 5 MG PO TABS
5.0000 mg | ORAL_TABLET | Freq: Every day | ORAL | Status: DC
  Administered 2013-10-07 – 2013-10-08 (×2): 5 mg via ORAL
  Filled 2013-10-07 (×5): qty 1

## 2013-10-07 MED ORDER — OXYCODONE HCL 5 MG PO TABS
10.0000 mg | ORAL_TABLET | Freq: Four times a day (QID) | ORAL | Status: DC
  Administered 2013-10-07 – 2013-10-08 (×4): 10 mg via ORAL
  Filled 2013-10-07 (×4): qty 2

## 2013-10-07 MED ORDER — POTASSIUM CHLORIDE IN NACL 40-0.9 MEQ/L-% IV SOLN
INTRAVENOUS | Status: DC
  Administered 2013-10-07 (×2): via INTRAVENOUS

## 2013-10-07 MED ORDER — POTASSIUM CHLORIDE CRYS ER 20 MEQ PO TBCR
40.0000 meq | EXTENDED_RELEASE_TABLET | Freq: Three times a day (TID) | ORAL | Status: DC
  Administered 2013-10-07 – 2013-10-08 (×3): 40 meq via ORAL
  Filled 2013-10-07 (×3): qty 2

## 2013-10-07 NOTE — Progress Notes (Signed)
*  PRELIMINARY RESULTS* Echocardiogram 2D Echocardiogram has been performed.  Rhonda Santos 10/07/2013, 11:16 AM

## 2013-10-07 NOTE — Progress Notes (Signed)
CRITICAL VALUE ALERT  Critical value received:  Potassium 2.1  Date of notification:  10/07/13  Time of notification:  0640  Critical value read back:yes  Nurse who received alert:  Lawson Fiscal RN  MD notified (1st page):  Drusilla Kanner  Time of first page:  0645  MD notified (2nd page):  Time of second page:  Responding MD:  Drusilla Kanner  Time MD responded:  [phone]

## 2013-10-07 NOTE — Progress Notes (Signed)
CRITICAL VALUE ALERT  Critical value received:  K+ 2.2  Date of notification:  10/07/13  Time of notification:  1100  Critical value read back:yes  Nurse who received alert:  Sherrye Payor RN  MD notified (1st page):  Dr. Sherrie Mustache  Time of first page:  1250  MD notified (2nd page):  Time of second page:  Responding MD:  Dr Sherrie Mustache  Time MD responded:  1255

## 2013-10-07 NOTE — Consult Note (Signed)
Consult requested by: Dr. Sharl Ma Consult requested for hypoxia:  HPI: This is a 67 year old Caucasian female who was brought to the emergency department because of confusion, altered mental status and who was found to have hypoxia and marked hypokalemia. She denies any chest pain. She has an approximately 30-40-pack-year smoking history but says she was not short of breath as far as she can remember and does not have any respiratory limitations. She has more limitations from her back pain. She has been on chronic narcotic treatment for degenerative disc disease. She recently had a fentanyl patch added to her previous medications. She has been having a lot of leg swelling.  Past Medical History  Diagnosis Date  . Hypertension   . Hyperlipidemia   . DDD (degenerative disc disease)   . Osteoporosis   . Prolonged Q-T interval on ECG 10/07/2013  . Hypokalemia 10/06/2013  . Tobacco abuse 10/07/2013     History reviewed. No pertinent family history.   History   Social History  . Marital Status: Married    Spouse Name: N/A    Number of Children: N/A  . Years of Education: N/A   Social History Main Topics  . Smoking status: Never Smoker   . Smokeless tobacco: None  . Alcohol Use: No  . Drug Use: No  . Sexual Activity: None   Other Topics Concern  . None   Social History Narrative  . None     ROS: She denies any chest pain fever chills cough sputum production. She has occasional abdominal pain. She has had chronic back pain and has had leg swelling    Objective: Vital signs in last 24 hours: Temp:  [97.5 F (36.4 C)-98.7 F (37.1 C)] 98.7 F (37.1 C) (10/16 0642) Pulse Rate:  [61-67] 61 (10/16 0642) Resp:  [18-20] 20 (10/16 0642) BP: (103-125)/(58-65) 109/58 mmHg (10/16 0642) SpO2:  [92 %-98 %] 93 % (10/16 0813) Weight:  [71.215 kg (157 lb)-71.804 kg (158 lb 4.8 oz)] 71.804 kg (158 lb 4.8 oz) (10/15 2328) Weight change:  Last BM Date: 10/06/13  Intake/Output from  previous day:    PHYSICAL EXAM She is awake and alert. Her pupils are reactive. Her nose and throat are clear. She has some missing teeth. Her mucous membranes are moist. Her neck is supple without masses. Her chest is clear. Her heart is regular without gallop. Her abdomen is soft no masses are felt she still has edema of the extremities. Her central nervous system examination is grossly intact  Lab Results: Basic Metabolic Panel:  Recent Labs  16/10/96 2037 10/07/13 0529  NA 136 139  K 2.0* 2.1*  CL 87* 93*  CO2 43* 39*  GLUCOSE 140* 139*  BUN 4* 3*  CREATININE 0.92 0.87  CALCIUM 8.4 8.0*   Liver Function Tests:  Recent Labs  10/06/13 2037 10/07/13 0529  AST 51* 41*  ALT 15 12  ALKPHOS 153* 129*  BILITOT 1.2 1.2  PROT 6.7 5.8*  ALBUMIN 2.6* 2.3*    Recent Labs  10/06/13 2234  LIPASE 6*   No results found for this basename: AMMONIA,  in the last 72 hours CBC:  Recent Labs  10/06/13 2037 10/07/13 0529  WBC 11.3* 8.6  NEUTROABS 5.1  --   HGB 13.9 12.5  HCT 40.7 36.3  MCV 91.3 89.6  PLT 298 274   Cardiac Enzymes:  Recent Labs  10/06/13 2037 10/07/13 0009 10/07/13 0529  TROPONINI <0.30 <0.30 <0.30   BNP: No results found for  this basename: PROBNP,  in the last 72 hours D-Dimer: No results found for this basename: DDIMER,  in the last 72 hours CBG: No results found for this basename: GLUCAP,  in the last 72 hours Hemoglobin A1C: No results found for this basename: HGBA1C,  in the last 72 hours Fasting Lipid Panel: No results found for this basename: CHOL, HDL, LDLCALC, TRIG, CHOLHDL, LDLDIRECT,  in the last 72 hours Thyroid Function Tests: No results found for this basename: TSH, T4TOTAL, FREET4, T3FREE, THYROIDAB,  in the last 72 hours Anemia Panel: No results found for this basename: VITAMINB12, FOLATE, FERRITIN, TIBC, IRON, RETICCTPCT,  in the last 72 hours Coagulation:  Recent Labs  10/06/13 2037  LABPROT 14.5  INR 1.15   Urine  Drug Screen: Drugs of Abuse  No results found for this basename: labopia, cocainscrnur, labbenz, amphetmu, thcu, labbarb    Alcohol Level: No results found for this basename: ETH,  in the last 72 hours Urinalysis:  Recent Labs  10/06/13 2125  COLORURINE YELLOW  LABSPEC 1.010  PHURINE 6.5  GLUCOSEU NEGATIVE  HGBUR NEGATIVE  BILIRUBINUR NEGATIVE  KETONESUR NEGATIVE  PROTEINUR NEGATIVE  UROBILINOGEN 1.0  NITRITE NEGATIVE  LEUKOCYTESUR TRACE*   Misc. Labs:   ABGS:  Recent Labs  10/07/13 0035  PHART 7.497*  PO2ART 73.6*  TCO2 33.4  HCO3 38.4*     MICROBIOLOGY: No results found for this or any previous visit (from the past 240 hour(s)).  Studies/Results: Dg Chest 2 View  10/07/2013   CLINICAL DATA:  Hypoxia  EXAM: CHEST - 2 VIEW  COMPARISON:  None available.  FINDINGS: The cardiac and mediastinal silhouettes are within normal limits.  The lungs are normally inflated. No airspace consolidation is identified. Linear opacity at the left costophrenic angle most likely reflects a small left pleural effusion. There is no pneumothorax. No overt pulmonary edema. ,  IMPRESSION: Small left pleural effusion without pulmonary edema or focal airspace consolidation. .   Electronically Signed   By: Rise Mu M.D.   On: 10/07/2013 00:32   Ct Head Wo Contrast  10/06/2013   CLINICAL DATA:  hypertension with headache  EXAM: CT HEAD WITHOUT CONTRAST  TECHNIQUE: Contiguous axial images were obtained from the base of the skull through the vertex without intravenous contrast. Study was obtained within 24 hr of patient's arrival at the emergency department  COMPARISON:  None.  FINDINGS: There is mild diffuse atrophy. There is no mass, hemorrhage, extra-axial fluid collection, or midline shift. There is slight small vessel disease in the centra semiovale bilaterally. Gray-white compartments are otherwise normal. There is no demonstrable acute infarct.  Bony calvarium appears intact.  Mastoids on the left are clear. There is opacification of several inferior mastoid air cells on the right.  IMPRESSION: Atrophy with mild small vessel disease. No intracranial mass, hemorrhage, or acute appearing infarct. Opacification of several inferior mastoids air cells on the right.   Electronically Signed   By: Bretta Bang M.D.   On: 10/06/2013 21:08    Medications:  Prior to Admission:  Prescriptions prior to admission  Medication Sig Dispense Refill  . albuterol (PROVENTIL HFA;VENTOLIN HFA) 108 (90 BASE) MCG/ACT inhaler Inhale 2 puffs into the lungs every 6 (six) hours as needed for wheezing.      Marland Kitchen ALPRAZolam (XANAX) 0.5 MG tablet Take 0.5 mg by mouth 2 (two) times daily as needed. For anxiety      . fentaNYL (DURAGESIC - DOSED MCG/HR) 12 MCG/HR Place 1 patch onto the  skin every 3 (three) days.      Marland Kitchen FLUoxetine (PROZAC) 20 MG capsule Take 20 mg by mouth every morning.       . furosemide (LASIX) 40 MG tablet Take 2 tablets by mouth daily.       Marland Kitchen levothyroxine (SYNTHROID, LEVOTHROID) 175 MCG tablet Take 175 mcg by mouth daily before breakfast.       . metoprolol tartrate (LOPRESSOR) 25 MG tablet Take 25 mg by mouth daily. Prescribed one tablet twice daily      . omeprazole (PRILOSEC) 40 MG capsule Take 40 mg by mouth daily before breakfast.       . Oxycodone HCl 20 MG TABS Take 1-2 tablets by mouth every 4 (four) hours as needed. For pain      . pravastatin (PRAVACHOL) 20 MG tablet Take 1 tablet by mouth daily.       Scheduled: . enoxaparin (LOVENOX) injection  30 mg Subcutaneous Q24H  . FLUoxetine  20 mg Oral q morning - 10a  . levothyroxine  175 mcg Oral QAC breakfast  . metoprolol tartrate  25 mg Oral Daily  . potassium chloride  40 mEq Oral Q4H  . simvastatin  5 mg Oral q1800   Continuous: . 0.9 % NaCl with KCl 40 mEq / L 75 mL/hr at 10/07/13 0759   EAV:[id number], ondansetron (ZOFRAN) IV, ondansetron (ZOFRAN) IV, ondansetron, oxyCODONE  Assesment: She has been  hypokalemic. She had some change in mental status. She's been hypoxic. I think she probably does have some element of COPD and is relatively asymptomatic because she is relatively sedentary. Some of the problem may have been the addition of fentanyl patch. With the swelling of her legs and the hypoxia we need to rule out pulmonary embolus. I have ordered a d-dimer. If this is negative then I don't think she needs any further workup. Principal Problem:   Hypokalemia Active Problems:   Weakness   Altered mental status   Prolonged Q-T interval on ECG   Metabolic alkalosis   Tobacco abuse   Transaminitis   Hypoxia   Hyperglycemia    Plan: Continue potassium replacement. I would be in favor of discontinuing fentanyl patch. She needs a d-dimer as above. She'll need to have pulmonary function testing but this should be done when she is more stable as an outpatient.  Thanks for allow me to see her with you    LOS: 1 day   Rhonda Santos L 10/07/2013, 8:41 AM

## 2013-10-07 NOTE — Progress Notes (Signed)
CRITICAL VALUE ALERT  Critical value received:  K+ 2.4  Date of notification:  10/07/13  Time of notification:  1445  Critical value read back:yes  Nurse who received alert:  Sherrye Payor  MD notified (1st page):  Dr. Sherrie Mustache  Time of first page:  1500  MD notified (2nd page):  Time of second page:  Responding MD:    Time MD responded:    Paged MD to make them aware.  K+ is increasing and orders already entered by MD to address this issue

## 2013-10-07 NOTE — Progress Notes (Signed)
[facility] PROGRESS NOTE  Rhonda Santos UJW:[id number] DOB: 10/04/1946 DOA: 10/06/2013 PCP: Lenise Herald, PA-C    Code Status: Full code Family Communication: Not available. Disposition Plan: Anticipate discharge to home in medically improved.   Consultants:  Pulmonologist, Dr. Juanetta Gosling  Procedures: 2-D echocardiogram:Study Conclusions:  - Left ventricle: The cavity size was normal. Wall thickness was normal. Systolic function was normal. The estimated ejection fraction was in the range of 60% to 65%. Wall motion was normal; there were no regional wall motion abnormalities. Features are consistent with a pseudonormal left ventricular filling pattern, with concomitant abnormal relaxation and increased filling pressure (grade 2 diastolic dysfunction). - Aortic valve: Trileaflet; mildly calcified leaflets. There was no stenosis. Mild regurgitation. Mean gradient: 9mm Hg (S). - Mitral valve: Mildly thickened leaflets. Mild prolapse, involving the anterior leaflet with somewhat restricted posterior leaflet. Trivial regurgitation. - Atrial septum: No defect or patent foramen ovale was identified. - Tricuspid valve: Trivial regurgitation. Peak RV-RA gradient: 29mm Hg (S). - Pulmonary arteries: Systolic pressure could not be accurately estimated. - Inferior vena cava: Not well visualized. Unable to estimate CVP. - Pericardium, extracardiac: A trivial pericardial effusion was identified posterior to the heart. Impressions:  - No prior study for comparison. Normal LV wall thickness, LVEF 60-65%, grade 2 diastolic dysfunction. Mild mitral valve prolapse as outlined with trivial regurgitation. Mildly calcified aortic valve with mild regurgitation. Trivial tricuspid regurgitation. RV-RA gradient normal at 29 mmHg. Unable to estimate CVP. Trivial posterior pericardial effusion. Transthoracic echocardiography. M-mode, complete 2D, spectral Doppler, and color Doppler.  Height: Height: 160cm. Height: 63in. Weight: Weight: 71.2kg. Weight: 156.7lb. Body mass index: BMI: 27.8kg/m^2. Body surface area: BSA: 1.54m^2. Blood pressure: 125/58. Patient status: Inpatient. Location: Bedside.    Antibiotics:  None  HPI/Subjective: The patient has no complaints of dizziness, chest pain, shortness of breath, weakness. She has no complaints of abdominal pain. She wants to go home.  Objective: Filed Vitals:   10/07/13 1121  BP: 113/56  Pulse: 67  Temp:   Resp:    No intake or output data in the 24 hours ending 10/07/13 1344 Filed Weights   10/06/13 1929 10/06/13 2328  Weight: 71.215 kg (157 lb) 71.804 kg (158 lb 4.8 oz)    Exam:   General:  Alert 67 year old Caucasian woman sitting up in bed, alert, in no acute distress.  Cardiovascular: S1, S2, with a soft systolic murmur.  Respiratory: Clear to auscultation bilaterally.  Abdomen: Obese, positive bowel sounds, soft, nontender, nondistended.  Musculoskeletal: No acute hot red joints. Trace of pedal edema bilaterally.   Neurologic: She is alert and oriented to herself, hospital, and year. Cranial nerves II through XII are grossly intact.  Data Reviewed: Basic Metabolic Panel:  Recent Labs Lab 10/06/13 2037 10/07/13 0529 10/07/13 1035  NA 136 139 139  K 2.0* 2.1* 2.2*  CL 87* 93* 94*  CO2 43* 39* 39*  GLUCOSE 140* 139* 175*  BUN 4* 3* 3*  CREATININE 0.92 0.87 0.76  CALCIUM 8.4 8.0* 8.1*   Liver Function Tests:  Recent Labs Lab 10/06/13 2037 10/07/13 0529  AST 51* 41*  ALT 15 12  ALKPHOS 153* 129*  BILITOT 1.2 1.2  PROT 6.7 5.8*  ALBUMIN 2.6* 2.3*    Recent Labs Lab 10/06/13 2234  LIPASE 6*   No results found for this basename: AMMONIA,  in the last 168 hours CBC:  Recent Labs Lab 10/06/13 2037 10/07/13 0529  WBC 11.3* 8.6  NEUTROABS 5.1  --   HGB 13.9  12.5  HCT 40.7 36.3  MCV 91.3 89.6  PLT 298 274   Cardiac Enzymes:  Recent Labs Lab 10/06/13 2037  10/07/13 0009 10/07/13 0529 10/07/13 1035  TROPONINI <0.30 <0.30 <0.30 <0.30   BNP (last 3 results) No results found for this basename: PROBNP,  in the last 8760 hours CBG: No results found for this basename: GLUCAP,  in the last 168 hours  No results found for this or any previous visit (from the past 240 hour(s)).   Studies: Dg Chest 2 View  10/07/2013   CLINICAL DATA:  Hypoxia  EXAM: CHEST - 2 VIEW  COMPARISON:  None available.  FINDINGS: The cardiac and mediastinal silhouettes are within normal limits.  The lungs are normally inflated. No airspace consolidation is identified. Linear opacity at the left costophrenic angle most likely reflects a small left pleural effusion. There is no pneumothorax. No overt pulmonary edema. ,  IMPRESSION: Small left pleural effusion without pulmonary edema or focal airspace consolidation. .   Electronically Signed   By: Rise Mu M.D.   On: 10/07/2013 00:32   Ct Head Wo Contrast  10/06/2013   CLINICAL DATA:  hypertension with headache  EXAM: CT HEAD WITHOUT CONTRAST  TECHNIQUE: Contiguous axial images were obtained from the base of the skull through the vertex without intravenous contrast. Study was obtained within 24 hr of patient's arrival at the emergency department  COMPARISON:  None.  FINDINGS: There is mild diffuse atrophy. There is no mass, hemorrhage, extra-axial fluid collection, or midline shift. There is slight small vessel disease in the centra semiovale bilaterally. Gray-white compartments are otherwise normal. There is no demonstrable acute infarct.  Bony calvarium appears intact. Mastoids on the left are clear. There is opacification of several inferior mastoid air cells on the right.  IMPRESSION: Atrophy with mild small vessel disease. No intracranial mass, hemorrhage, or acute appearing infarct. Opacification of several inferior mastoids air cells on the right.   Electronically Signed   By: Bretta Bang M.D.   On: 10/06/2013  21:08   US Abdomen Complete  10/07/2013   CLINICAL DATA:  Elevated liver function tests.  EXAM: ULTRASOUND ABDOMEN COMPLETE  COMPARISON:  None.  FINDINGS: Gallbladder  There is a single 1.3 cm stone in the gallbladder. Gallbladder wall is not thickened. Negative sonographic Murphy's sign.  Common bile duct  Diameter: Normal. 3 mm in diameter.  Liver  Hepatomegaly with diffuse increased echogenicity of slight heterogeneity without focal lesions. Findings consistent with hepatic steatosis.  IVC  Not visible due to bowel gas.  Pancreas  Not well seen due to bowel gas.  Spleen  Normal. 7.9 cm in length.  Right Kidney  Length: 10.8 cm. Echogenicity within normal limits. No mass or hydronephrosis visualized.  Left Kidney  Length: 9.3 cm. Echogenicity within normal limits. No mass or hydronephrosis visualized.  Abdominal aorta  Obscured by bowel gas.  IMPRESSION: 1. Single gallstone. 2. The pancreas, inferior vena cava, and abdominal aorta are obscured by bowel gas. 3. Hepatomegaly with hepatic steatosis.   Electronically Signed   By: Geanie Cooley M.D.   On: 10/07/2013 09:53    Scheduled Meds: . enoxaparin (LOVENOX) injection  30 mg Subcutaneous Q24H  . FLUoxetine  20 mg Oral q morning - 10a  . levothyroxine  175 mcg Oral QAC breakfast  . metoprolol tartrate  25 mg Oral Daily  . potassium chloride  40 mEq Oral Q4H  . simvastatin  5 mg Oral q1800   Continuous Infusions: .  0.9 % NaCl with KCl 40 mEq / L 75 mL/hr at 10/07/13 0759   Assessment   Principal Problem:   Hypokalemia Active Problems:   Prolonged Q-T interval on ECG   Weakness   Altered mental status   Metabolic alkalosis   Tobacco abuse   Transaminitis   Hypoxia   Hyperglycemia   Diastolic dysfunction  1. Severe hypokalemia. This is likely secondary to recent furosemide treatment without potassium supplementation for lower extremity edema. Magnesium level was ordered this morning, but not resulted yet. She was given 2 g of magnesium  sulfate IV in the emergency department empirically. Her serum potassium has not improved much after a several runs of IV potassium and oral doses.  Prolonged QT/QTC on EKG. A followup EKG was ordered, but apparently has not been done yet. Her cardiac enzymes are within normal limits. Her 2-D echocardiogram reveals preserved LV function, but with diastolic dysfunction. Left ventricular wall motion was normal.  Grade 2 diastolic dysfunction, per 2-D echocardiogram. No evidence of decompensation.  Transient hypoxia/transient acute respiratory failure. [name] oxygen saturation has improved as her mental status has improved. She is oxygenating 94-95% on room air. She has no complaints of chest pain. I believe her transient hypoxia was secondary to mild respiratory depression from hypokalemia and opiates. Clinically, she does not have a pulmonary embolism. D-dimer discontinued.  Metabolic alkalosis. This is likely secondary to contraction alkalosis from recent furosemide treatment. It will likely resolve with IV fluid hydration.  Mild hepatic transaminitis, hepatic steatosis and gallstone. The patient has no complaints of abdominal pain currently. The mild transaminitis is likely secondary to hepatic steatosis seen on ultrasound. No evidence of acute cholecystitis.  Hyperglycemia. The patient has no history of diabetes. Hemoglobin A1c has been ordered and is pending.  Altered mental status/acute encephalopathy. Etiology multifactorial including hypokalemia and chronic high-dose opiate treatment. MRI of the brain canceled as the patient has no cranial nerve deficits and no unilateral focal weakness. Her mental status has improved and she is alert and oriented.  Tobacco abuse. The patient was advised to stop smoking.   Chronic pain syndrome. 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 She takes fentanyl patch and 20 mg of oxycodone every 4 hours. Tinel is being withheld. Oxycodone  changed to when necessary, but will schedule it to avoid withdrawal syndrome.     Plan: 1. I called the lab to get a stat magnesium level. I also asked the secretary to page respiratory therapy to obtain a stat EKG. 2. Continue potassium chloride supplementation orally and intravenously. We'll add amiloride x1 dose to help with treatment. We'll continue checking basic metabolic panel every 4 hours for total of 24 hours. 3. We'll check a hemoglobin A1c and start sliding scale NovoLog. 4. TSH ordered and is pending. 5. Discontinue bed rest and asked the nursing staff to ablate the patient. 6. Tobacco cessation counseling. 7. Restart oxycodone at lower dose of 10 mg every 6 hours, schedule.   Time spent: 40 minutes.    [facility]  [facility] Pager [phone]. If 7PM-7AM, please contact night-coverage at [email or web address], password [facility] 10/07/2013, 1:44 PM  LOS: 1 day

## 2013-10-08 ENCOUNTER — Inpatient Hospital Stay (HOSPITAL_COMMUNITY): Payer: Medicare PPO

## 2013-10-08 DIAGNOSIS — E119 Type 2 diabetes mellitus without complications: Secondary | ICD-10-CM | POA: Diagnosis present

## 2013-10-08 LAB — BASIC METABOLIC PANEL
BUN: <3 mg/dL — ABNORMAL LOW (ref 6–23)
BUN: <3 mg/dL — ABNORMAL LOW (ref 6–23)
Calcium: 8 mg/dL — ABNORMAL LOW (ref 8.4–10.5)
Calcium: 8.1 mg/dL — ABNORMAL LOW (ref 8.4–10.5)
Chloride: 101 mEq/L (ref 96–112)
Creatinine, Ser: 0.69 mg/dL (ref 0.50–1.10)
GFR calc Af Amer: >90 mL/min (ref >90)
GFR calc Af Amer: >90 mL/min (ref >90)
GFR calc Af Amer: >90 mL/min (ref >90)
GFR calc Af Amer: >90 mL/min (ref >90)
GFR calc non Af Amer: 86 mL/min — ABNORMAL LOW (ref >90)
GFR calc non Af Amer: 87 mL/min — ABNORMAL LOW (ref >90)
GFR calc non Af Amer: 86 mL/min — ABNORMAL LOW (ref >90)
GFR calc non Af Amer: 87 mL/min — ABNORMAL LOW (ref >90)
Glucose, Bld: 129 mg/dL — ABNORMAL HIGH (ref 70–99)
Glucose, Bld: 166 mg/dL — ABNORMAL HIGH (ref 70–99)
Potassium: 3.2 mEq/L — ABNORMAL LOW (ref 3.5–5.1)
Potassium: 4.2 mEq/L (ref 3.5–5.1)
Sodium: 142 mEq/L (ref 135–145)
Sodium: 141 mEq/L (ref 135–145)

## 2013-10-08 LAB — URINE CULTURE
Colony Count: NO GROWTH
Culture: NO GROWTH

## 2013-10-08 MED ORDER — LIVING WELL WITH DIABETES BOOK
Freq: Once | Status: AC
  Administered 2013-10-08: 13:00:00
  Filled 2013-10-08: qty 1

## 2013-10-08 MED ORDER — METFORMIN HCL 500 MG PO TABS
250.0000 mg | ORAL_TABLET | Freq: Every day | ORAL | Status: DC
  Administered 2013-10-08: 250 mg via ORAL
  Filled 2013-10-08: qty 1

## 2013-10-08 MED ORDER — OXYCODONE HCL 20 MG PO TABS: 1.0000 | ORAL_TABLET | ORAL | Status: DC | PRN

## 2013-10-08 MED ORDER — POTASSIUM CHLORIDE CRYS ER 20 MEQ PO TBCR: 40.0000 meq | EXTENDED_RELEASE_TABLET | Freq: Every day | ORAL | Status: DC

## 2013-10-08 MED ORDER — ENOXAPARIN SODIUM 40 MG/0.4ML ~~LOC~~ SOLN: 40.0000 mg | SUBCUTANEOUS | Status: DC

## 2013-10-08 MED ORDER — METFORMIN HCL 500 MG PO TABS: 250.0000 mg | ORAL_TABLET | Freq: Every day | ORAL | Status: DC

## 2013-10-08 NOTE — Progress Notes (Signed)
NURSING PROGRESS NOTE  Rhonda Santos [id number] Discharge Data: 10/08/2013 2:51 PM Attending Provider: No att. providers found WUJ:WJXB, BENJAMIN, PA-C     [name] to be D/C'd Home per MD order.  Discussed with the patient and son  the After Visit Summary and all questions fully answered. Diabetes education was provided.  All IV's discontinued with no bleeding noted. Telemetry discontinued. All belongings returned to patient for patient to take home.   Last Vital Signs:  Blood pressure 105/55, pulse 66, temperature 98.2 F (36.8 C), temperature source Oral, resp. rate 20, height 5\' 3"  (1.6 m), weight 71.804 kg (158 lb 4.8 oz), SpO2 93.00%.  Discharge Medication List   Medication List    STOP taking these medications       fentaNYL 12 MCG/HR  Commonly known as:  DURAGESIC - dosed mcg/hr     furosemide 40 MG tablet  Commonly known as:  LASIX      TAKE these medications       albuterol 108 (90 BASE) MCG/ACT inhaler  Commonly known as:  PROVENTIL HFA;VENTOLIN HFA  Inhale 2 puffs into the lungs every 6 (six) hours as needed for wheezing.     ALPRAZolam 0.5 MG tablet  Commonly known as:  XANAX  Take 0.5 mg by mouth 2 (two) times daily as needed. For anxiety     FLUoxetine 20 MG capsule  Commonly known as:  PROZAC  Take 20 mg by mouth every morning.     levothyroxine 175 MCG tablet  Commonly known as:  SYNTHROID, LEVOTHROID  Take 175 mcg by mouth daily before breakfast.     metFORMIN 500 MG tablet  Commonly known as:  GLUCOPHAGE  Take 0.5 tablets (250 mg total) by mouth daily at 12 noon.     metoprolol tartrate 25 MG tablet  Commonly known as:  LOPRESSOR  Take 25 mg by mouth daily. Prescribed one tablet twice daily     omeprazole 40 MG capsule  Commonly known as:  PRILOSEC  Take 40 mg by mouth daily before breakfast.     Oxycodone HCl 20 MG Tabs  Take 1 tablet (20 mg total) by mouth every 4 (four) hours as needed. For pain     potassium chloride SA 20 MEQ  tablet  Commonly known as:  K-DUR,KLOR-CON  Take 2 tablets (40 mEq total) by mouth daily.     pravastatin 20 MG tablet  Commonly known as:  PRAVACHOL  Take 1 tablet by mouth daily.

## 2013-10-08 NOTE — Progress Notes (Signed)
  RD consulted for nutrition education regarding diabetes.   Lab Results  Component Value Date   HGBA1C 6.5* 10/07/2013    RD provided "Carbohydrate Counting for People with Diabetes" handout from the Academy of [employer]etics. Discussed different food groups and their effects on blood sugar, emphasizing carbohydrate-containing foods. Provided list of carbohydrates and recommended serving sizes of common foods.  Discussed importance of controlled and consistent carbohydrate intake throughout the day. Provided examples of ways to balance meals/snacks and encouraged intake of high-fiber, whole grain complex carbohydrates. Teach back method used.  Expect fair to good compliance.  Body mass index is 28.05 kg/(m^2). Pt meets criteria for overweight based on current BMI.  Current diet order is Heart Healthy, carb modified, patient is consuming approximately 75% of meals at this time. Labs and medications reviewed. No further nutrition interventions warranted at this time. RD contact information provided. If additional nutrition issues arise, please re-consult RD.  Per DM coordinator request, pt was provided with information for outpatient diabetes education class at [facility]. Provided list of dates and times of available classes through the end of the calendar year. Informed pt that registration is required to attend class. Encouraged attendance.   Ronith Berti A. Mayford Knife, RD, LDN Pager: [phone]

## 2013-10-08 NOTE — Progress Notes (Signed)
Subjective: She says she feels much better. She has no new complaints.  Objective: Vital signs in last 24 hours: Temp:  [98.2 F (36.8 C)] 98.2 F (36.8 C) (10/16 2119) Pulse Rate:  [66-67] 66 (10/16 2119) Resp:  [20] 20 (10/16 2119) BP: (105-113)/(55-56) 105/55 mmHg (10/16 2119) SpO2:  [93 %-94 %] 93 % (10/16 2119) Weight change:  Last BM Date: 10/06/13  Intake/Output from previous day: 10/16 0701 - 10/17 0700 In: 1562.1 [P.O.:240; I.V.:922.1; IV Piggyback:400] Out: -   PHYSICAL EXAM General appearance: alert, cooperative and no distress Resp: clear to auscultation bilaterally Cardio: regular rate and rhythm, S1, S2 normal, no murmur, click, rub or gallop GI: soft, non-tender; bowel sounds normal; no masses,  no organomegaly Extremities: Her edema is better  Lab Results:    Basic Metabolic Panel:  Recent Labs  16/10/96 1357 10/07/13 1836 10/07/13 2354 10/08/13 0354  NA 137 139 139 143  K 2.4* 2.6* 3.2* 3.6  CL 94* 97 101 108  CO2 36* 35* 31 29  GLUCOSE 196* 173* 151* 113*  BUN 3* 3* <3* <3*  CREATININE 0.72 0.71 0.70 0.69  CALCIUM 8.0* 8.2* 8.3* 8.0*  MG 2.3 2.3  --   --    Liver Function Tests:  Recent Labs  10/06/13 2037 10/07/13 0529  AST 51* 41*  ALT 15 12  ALKPHOS 153* 129*  BILITOT 1.2 1.2  PROT 6.7 5.8*  ALBUMIN 2.6* 2.3*    Recent Labs  10/06/13 2234  LIPASE 6*   No results found for this basename: AMMONIA,  in the last 72 hours CBC:  Recent Labs  10/06/13 2037 10/07/13 0529  WBC 11.3* 8.6  NEUTROABS 5.1  --   HGB 13.9 12.5  HCT 40.7 36.3  MCV 91.3 89.6  PLT 298 274   Cardiac Enzymes:  Recent Labs  10/07/13 0009 10/07/13 0529 10/07/13 1035  TROPONINI <0.30 <0.30 <0.30   BNP: No results found for this basename: PROBNP,  in the last 72 hours D-Dimer: No results found for this basename: DDIMER,  in the last 72 hours CBG: No results found for this basename: GLUCAP,  in the last 72 hours Hemoglobin A1C:  Recent  Labs  10/07/13 0529  HGBA1C 6.5*   Fasting Lipid Panel: No results found for this basename: CHOL, HDL, LDLCALC, TRIG, CHOLHDL, LDLDIRECT,  in the last 72 hours Thyroid Function Tests:  Recent Labs  10/07/13 0009  TSH 1.044   Anemia Panel: No results found for this basename: VITAMINB12, FOLATE, FERRITIN, TIBC, IRON, RETICCTPCT,  in the last 72 hours Coagulation:  Recent Labs  10/06/13 2037  LABPROT 14.5  INR 1.15   Urine Drug Screen: Drugs of Abuse  No results found for this basename: labopia, cocainscrnur, labbenz, amphetmu, thcu, labbarb    Alcohol Level: No results found for this basename: ETH,  in the last 72 hours Urinalysis:  Recent Labs  10/06/13 2125  COLORURINE YELLOW  LABSPEC 1.010  PHURINE 6.5  GLUCOSEU NEGATIVE  HGBUR NEGATIVE  BILIRUBINUR NEGATIVE  KETONESUR NEGATIVE  PROTEINUR NEGATIVE  UROBILINOGEN 1.0  NITRITE NEGATIVE  LEUKOCYTESUR TRACE*   Misc. Labs:  ABGS  Recent Labs  10/07/13 0035  PHART 7.497*  PO2ART 73.6*  TCO2 33.4  HCO3 38.4*   CULTURES Recent Results (from the past 240 hour(s))  URINE CULTURE     Status: None   Collection Time    10/06/13  9:25 PM      Result Value Range Status   Specimen Description URINE,  CLEAN CATCH   Final   Special Requests NONE   Final   Culture  Setup Time     Final   Value: 10/06/2013 21:55     Performed at [facility] Count     Final   Value: NO GROWTH     Performed at [facility]   Culture     Final   Value: NO GROWTH     Performed at [facility]   Report Status 10/08/2013 FINAL   Final   Studies/Results: Dg Chest 2 View  10/07/2013   CLINICAL DATA:  Hypoxia  EXAM: CHEST - 2 VIEW  COMPARISON:  None available.  FINDINGS: The cardiac and mediastinal silhouettes are within normal limits.  The lungs are normally inflated. No airspace consolidation is identified. Linear opacity at the left costophrenic angle most likely reflects a small left pleural  effusion. There is no pneumothorax. No overt pulmonary edema. ,  IMPRESSION: Small left pleural effusion without pulmonary edema or focal airspace consolidation. .   Electronically Signed   By: Rise Mu M.D.   On: 10/07/2013 00:32   Ct Head Wo Contrast  10/06/2013   CLINICAL DATA:  hypertension with headache  EXAM: CT HEAD WITHOUT CONTRAST  TECHNIQUE: Contiguous axial images were obtained from the base of the skull through the vertex without intravenous contrast. Study was obtained within 24 hr of patient's arrival at the emergency department  COMPARISON:  None.  FINDINGS: There is mild diffuse atrophy. There is no mass, hemorrhage, extra-axial fluid collection, or midline shift. There is slight small vessel disease in the centra semiovale bilaterally. Gray-white compartments are otherwise normal. There is no demonstrable acute infarct.  Bony calvarium appears intact. Mastoids on the left are clear. There is opacification of several inferior mastoid air cells on the right.  IMPRESSION: Atrophy with mild small vessel disease. No intracranial mass, hemorrhage, or acute appearing infarct. Opacification of several inferior mastoids air cells on the right.   Electronically Signed   By: Bretta Bang M.D.   On: 10/06/2013 21:08   US Abdomen Complete  10/07/2013   CLINICAL DATA:  Elevated liver function tests.  EXAM: ULTRASOUND ABDOMEN COMPLETE  COMPARISON:  None.  FINDINGS: Gallbladder  There is a single 1.3 cm stone in the gallbladder. Gallbladder wall is not thickened. Negative sonographic Murphy's sign.  Common bile duct  Diameter: Normal. 3 mm in diameter.  Liver  Hepatomegaly with diffuse increased echogenicity of slight heterogeneity without focal lesions. Findings consistent with hepatic steatosis.  IVC  Not visible due to bowel gas.  Pancreas  Not well seen due to bowel gas.  Spleen  Normal. 7.9 cm in length.  Right Kidney  Length: 10.8 cm. Echogenicity within normal limits. No mass or  hydronephrosis visualized.  Left Kidney  Length: 9.3 cm. Echogenicity within normal limits. No mass or hydronephrosis visualized.  Abdominal aorta  Obscured by bowel gas.  IMPRESSION: 1. Single gallstone. 2. The pancreas, inferior vena cava, and abdominal aorta are obscured by bowel gas. 3. Hepatomegaly with hepatic steatosis.   Electronically Signed   By: Geanie Cooley M.D.   On: 10/07/2013 09:53    Medications:  Prior to Admission:  Prescriptions prior to admission  Medication Sig Dispense Refill  . albuterol (PROVENTIL HFA;VENTOLIN HFA) 108 (90 BASE) MCG/ACT inhaler Inhale 2 puffs into the lungs every 6 (six) hours as needed for wheezing.      Marland Kitchen ALPRAZolam (XANAX) 0.5 MG tablet Take 0.5  mg by mouth 2 (two) times daily as needed. For anxiety      . fentaNYL (DURAGESIC - DOSED MCG/HR) 12 MCG/HR Place 1 patch onto the skin every 3 (three) days.      Marland Kitchen FLUoxetine (PROZAC) 20 MG capsule Take 20 mg by mouth every morning.       . furosemide (LASIX) 40 MG tablet Take 2 tablets by mouth daily.       Marland Kitchen levothyroxine (SYNTHROID, LEVOTHROID) 175 MCG tablet Take 175 mcg by mouth daily before breakfast.       . metoprolol tartrate (LOPRESSOR) 25 MG tablet Take 25 mg by mouth daily. Prescribed one tablet twice daily      . omeprazole (PRILOSEC) 40 MG capsule Take 40 mg by mouth daily before breakfast.       . Oxycodone HCl 20 MG TABS Take 1-2 tablets by mouth every 4 (four) hours as needed. For pain      . pravastatin (PRAVACHOL) 20 MG tablet Take 1 tablet by mouth daily.       Scheduled: . aMILoride  5 mg Oral Daily  . enoxaparin (LOVENOX) injection  30 mg Subcutaneous Q24H  . FLUoxetine  20 mg Oral q morning - 10a  . levothyroxine  175 mcg Oral QAC breakfast  . metoprolol tartrate  25 mg Oral Daily  . oxyCODONE  10 mg Oral Q6H  . potassium chloride  40 mEq Oral TID  . simvastatin  5 mg Oral q1800   Continuous: . 0.9 % NaCl with KCl 40 mEq / L 50 mL/hr at 10/07/13 2325   [id number],  ondansetron (ZOFRAN) IV, ondansetron  Assesment: She was admitted with altered mental status. She has improved. She probably has COPD we'll need to have a pulmonary function test as an outpatient to confirm. She was markedly hypokalemic which is better. I think some of her problem with the mental status may have been from the addition of fentanyl to her previous medications. Principal Problem:   Hypokalemia Active Problems:   Weakness   Altered mental status   Prolonged Q-T interval on ECG   Metabolic alkalosis   Tobacco abuse   Transaminitis   Hypoxia   Hyperglycemia   Diastolic dysfunction    Plan: I will plan to sign off at this point. Thanks for allow me to see her with you.    LOS: 2 days   Jariya Reichow L 10/08/2013, 8:38 AM

## 2013-10-08 NOTE — Progress Notes (Addendum)
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 Have ordered all teaching materials that staff can use to teach basic skills of diabetes. Will contact OP education dietician to notify pt of schedule for education. Have ordered [facility] notes with appropriate educational materials. No CBG's ordered.  Will request order tidwc and HS.    Thank you, Lenor Coffin, RN, CNS, Diabetes Coordinator [phone])

## 2013-10-08 NOTE — Discharge Summary (Signed)
Physician Discharge Summary  Rhonda Santos ZOX:[id number] DOB: [birth year] DOA: 10/06/2013  PCP: Lenise Herald, PA-C  Admit date: 10/06/2013 Discharge date: 10/08/2013  Time spent: Greater than minutes  Recommendations for Outpatient Follow-up:  1. The patient will need her potassium level rechecked. Recommend avoid giving the patient lasix without potassium supplementation. 2. Fentanyl was discontinued due to  possible respiratory suppression with oxycodone.  Discharge Diagnoses:   Severe hypokalemia, secondary to lasix.   Prolonged Q-T interval, likely secondary to hypokalemia   Mild acute hypoxic and hypercapneic respiratory failure from mild opioid induced respiratory depression    Acute encephalopathy secondary to hypokalemia and opioid induced somnolence.   Grade 2 diastolic dysfunction, per 2-D echo   Metabolic alkalosis   Newly diagnosed DM (diabetes mellitus), type 2. A1c 6.5   Tobacco abuse   Transaminitis, likely secondary to hepatic steatosis.   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 Patient was very drowsy in the ED and her O2 sats were dropping around 85. In the ED she was found to have severe hypokalemia with a potassium of 2.0. She complained of some abdominal pain. She did have elevated LFts, with mild elevation of alkaline phosphatase. She denied chest pain. No shortness of breath. EKG in the ED showed QTC prolonged more than 600 ms. Patient was recently started on Lasix 80 mg by mouth daily for leg swelling. Patient is smoker and smoked 2 packs per day. Patient was also  started on fentanyl patch 12 mcg every 72 hours and had been taking oxycodone 20 mg tablet every 4 hours.    Hospital Course:   1. Severe hypokalemia. Etiology was likely secondary to recent furosemide treatment without potassium supplementation for lower extremity edema. She was treated with oral and IV potassium chloride.  She was given 2 g of magnesium sulfate IV in the emergency department empirically. Her follow up magnesium level was 2.3.  Her serum potassium improved to 4.2 at the time of discharge. She will need a follow blood potassium level re-checked at her appointment.  Prolonged QT/QTC on EKG. Her QT interval was approximately 600 on admission. Following correction of hypokalemia, it improved to 468.  Her cardiac enzymes were normal. Her 2-D echocardiogram revealed preserved LV function, but with diastolic dysfunction and mild mitral valve prolapse. Left ventricular wall motion was normal.   Grade 2 diastolic dysfunction, per 2-D echocardiogram. No evidence of decompensation.   Transient hypoxia/transient acute respiratory failure. The patient was supplemented with 02. Her oxygen saturation improved as her mental status improved. She began oxygenating  94-95% on room air. She had no complaints of chest pain. Her transient hypoxia was secondary to mild respiratory depression from hypokalemia and opiates. Clinically, she did not have a pulmonary embolism.   Metabolic alkalosis. This was likely secondary to contraction alkalosis from recent furosemide treatment and compensatory. It resolved with IV fluid hydration.   Mild hepatic transaminitis, hepatic steatosis and gallstone. The patient has no complaints of abdominal pain during the hospital course. The mild transaminitis was likely secondary to hepatic steatosis seen on the abdominal ultrasound. No evidence of acute  cholecystitis.   Hyperglycemia/Newly diagnosed DM. [name] venous glucose was elevated ranging form 120-140s. Her  A1c was elevated at 6.5. She was started on metformin and given diabetes education.      Altered mental status/acute encephalopathy. Transdermal fentanyl was stopped and oxycodone dosing was reduced. Etiology was multifactorial including hypokalemia and chronic high-dose opiate treatment.  MRI of the brain was canceled as the patient had no cranial nerve deficits and no unilateral focal weakness. Her mental status improved and she remained alert and oriented. She was instructed to stop the Fentanyl patch altogether.  Tobacco abuse. The patient was advised to stop smoking.   Chronic pain syndrome. 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 She was recently started on fentanyl patch, but had already been taking 20 mg of oxycodone every 4 hours. She was instructed to stop the fentanyl patch and to take oxycodone only as needed to avoid CNS side effects. She voiced understanding.    Procedures: Study Conclusions  - Left ventricle: The cavity size was normal. Wall thickness was normal. Systolic function was normal. The estimated ejection fraction was in the range of 60% to 65%. Wall motion was normal; there were no regional wall motion abnormalities. Features are consistent with a pseudonormal left ventricular filling pattern, with concomitant abnormal relaxation and increased filling pressure (grade 2 diastolic dysfunction). - Aortic valve: Trileaflet; mildly calcified leaflets. There was no stenosis. Mild regurgitation. Mean gradient: 9mm Hg (S). - Mitral valve: Mildly thickened leaflets. Mild prolapse, involving the anterior leaflet with somewhat restricted posterior leaflet. Trivial regurgitation. - Atrial septum: No defect or patent foramen ovale was identified. - Tricuspid valve: Trivial regurgitation. Peak RV-RA gradient: 29mm Hg (S). - Pulmonary arteries: Systolic pressure could not be accurately estimated. - Inferior vena cava: Not well visualized. Unable  to estimate CVP. - Pericardium, extracardiac: A trivial pericardial effusion was identified posterior to the heart. Impressions:  - No prior study for comparison. Normal LV wall thickness, LVEF 60-65%, grade 2 diastolic dysfunction. Mild mitral valve prolapse as outlined with trivial regurgitation. Mildly calcified aortic valve with mild regurgitation. Trivial tricuspid regurgitation. RV-RA gradient normal at 29 mmHg. Unable to estimate CVP. Trivial posterior pericardial effusion. Transthoracic echocardiography. M-mode, complete 2D, spectral Doppler, and color Doppler. Height: Height: 160cm. Height: 63in. Weight: Weight: 71.2kg. Weight: 156.7lb. Body mass index: BMI: 27.8kg/m^2. Body surface area: BSA: 1.10m^2. Blood pressure: 125/58. Patient status: Inpatient. Location: Bedside.   Consultations:  Pulmonologist, Dr. Juanetta Gosling  Discharge Exam: Filed Vitals:   10/07/13 2119  BP: 105/55  Pulse: 66  Temp: 98.2 F (36.8 C)  Resp: 20    General: Alert 67 year old Caucasian woman sitting up in bed, alert, in no acute distress.  Cardiovascular: S1, S2, with a soft systolic murmur.  Respiratory: Clear to auscultation bilaterally.  Abdomen: Obese, positive bowel sounds, soft, nontender, nondistended.  Musculoskeletal: No acute hot red joints. Trace of pedal edema bilaterally.  Neurologic: She is alert and oriented to herself, hospital, and year. Cranial nerves II through XII are grossly intact.   Discharge Instructions  Discharge Orders   Future Orders Complete By Expires   Diet - low sodium heart healthy  As directed    Discharge instructions  As directed    Comments:     You will need to followup with your primary care provider in one week to have your potassium rechecked. You have been given a prescription for potassium supplement for one week. Discuss further need for potassium supplement with your primary  care physician. Stop the fentanyl patch because it caused too much  sedation and low oxygen level. Check your blood sugars once daily.   Increase activity slowly  As directed        Medication List    STOP taking these medications       fentaNYL 12 MCG/HR  Commonly known as:  DURAGESIC - dosed mcg/hr     furosemide 40 MG tablet  Commonly known as:  LASIX      TAKE these medications       albuterol 108 (90 BASE) MCG/ACT inhaler  Commonly known as:  PROVENTIL HFA;VENTOLIN HFA  Inhale 2 puffs into the lungs every 6 (six) hours as needed for wheezing.     ALPRAZolam 0.5 MG tablet  Commonly known as:  XANAX  Take 0.5 mg by mouth 2 (two) times daily as needed. For anxiety     FLUoxetine 20 MG capsule  Commonly known as:  PROZAC  Take 20 mg by mouth every morning.     levothyroxine 175 MCG tablet  Commonly known as:  SYNTHROID, LEVOTHROID  Take 175 mcg by mouth daily before breakfast.     metFORMIN 500 MG tablet  Commonly known as:  GLUCOPHAGE  Take 0.5 tablets (250 mg total) by mouth daily at 12 noon.     metoprolol tartrate 25 MG tablet  Commonly known as:  LOPRESSOR  Take 25 mg by mouth daily. Prescribed one tablet twice daily     omeprazole 40 MG capsule  Commonly known as:  PRILOSEC  Take 40 mg by mouth daily before breakfast.     Oxycodone HCl 20 MG Tabs  Take 1 tablet (20 mg total) by mouth every 4 (four) hours as needed. For pain     potassium chloride SA 20 MEQ tablet  Commonly known as:  K-DUR,KLOR-CON  Take 2 tablets (40 mEq total) by mouth daily.     pravastatin 20 MG tablet  Commonly known as:  PRAVACHOL  Take 1 tablet by mouth daily.       Allergies  Allergen Reactions  . Aspirin        Follow-up Information   Follow up with [facility]. (Call office to schedule appointment)    Contact information:   [address] Duanne Moron [facility] [id number] [phone]       The results of significant diagnostics from this hospitalization (including imaging, microbiology, ancillary and  laboratory) are listed below for reference.    Significant Diagnostic Studies: Dg Chest 2 View  10/07/2013   CLINICAL DATA:  Hypoxia  EXAM: CHEST - 2 VIEW  COMPARISON:  None available.  FINDINGS: The cardiac and mediastinal silhouettes are within normal limits.  The lungs are normally inflated. No airspace consolidation is identified. Linear opacity at the left costophrenic angle most likely reflects a small left pleural effusion. There is no pneumothorax. No overt pulmonary edema. ,  IMPRESSION: Small left pleural effusion without pulmonary edema or focal airspace consolidation. .   Electronically Signed   By: Rise Mu M.D.   On: 10/07/2013 00:32   Ct Head Wo Contrast  10/06/2013   CLINICAL DATA:  hypertension with headache  EXAM: CT HEAD WITHOUT CONTRAST  TECHNIQUE: Contiguous axial images were obtained from the base of the skull through the vertex without intravenous contrast. Study was obtained within 24 hr of patient's arrival at the emergency department  COMPARISON:  None.  FINDINGS: There is mild diffuse atrophy. There is no mass, hemorrhage, extra-axial fluid  collection, or midline shift. There is slight small vessel disease in the centra semiovale bilaterally. Gray-white compartments are otherwise normal. There is no demonstrable acute infarct.  Bony calvarium appears intact. Mastoids on the left are clear. There is opacification of several inferior mastoid air cells on the right.  IMPRESSION: Atrophy with mild small vessel disease. No intracranial mass, hemorrhage, or acute appearing infarct. Opacification of several inferior mastoids air cells on the right.   Electronically Signed   By: Bretta Bang M.D.   On: 10/06/2013 21:08   US Abdomen Complete  10/07/2013   CLINICAL DATA:  Elevated liver function tests.  EXAM: ULTRASOUND ABDOMEN COMPLETE  COMPARISON:  None.  FINDINGS: Gallbladder  There is a single 1.3 cm stone in the gallbladder. Gallbladder wall is not thickened.  Negative sonographic Murphy's sign.  Common bile duct  Diameter: Normal. 3 mm in diameter.  Liver  Hepatomegaly with diffuse increased echogenicity of slight heterogeneity without focal lesions. Findings consistent with hepatic steatosis.  IVC  Not visible due to bowel gas.  Pancreas  Not well seen due to bowel gas.  Spleen  Normal. 7.9 cm in length.  Right Kidney  Length: 10.8 cm. Echogenicity within normal limits. No mass or hydronephrosis visualized.  Left Kidney  Length: 9.3 cm. Echogenicity within normal limits. No mass or hydronephrosis visualized.  Abdominal aorta  Obscured by bowel gas.  IMPRESSION: 1. Single gallstone. 2. The pancreas, inferior vena cava, and abdominal aorta are obscured by bowel gas. 3. Hepatomegaly with hepatic steatosis.   Electronically Signed   By: Geanie Cooley M.D.   On: 10/07/2013 09:53   US Venous Img Upper Uni Left  10/08/2013   CLINICAL DATA:  Pain and fluid collection round left arm PICC line  EXAM: UPPER LEFT VENOUS EXTREMITY ULTRASOUND  TECHNIQUE: Gray-scale sonography with graded compression, as well as color Doppler and duplex ultrasound were performed to evaluate the upper extremity deep venous system from the level of the subclavian vein and including the jugular, axillary, basilic and upper cephalic vein. Spectral Doppler was utilized to evaluate flow at rest and with distal augmentation maneuvers.  COMPARISON:  None.  FINDINGS: There is complete compressibility of the left internal jugular, subclavian, axillary, cephalic, basilic, and brachial veins. Color flow imaging demonstrates wide patency of these vessels as well as the radial and ulnar veins. Doppler analysis demonstrates a normal-appearing venous waveforms.  IMPRESSION: No evidence of left upper extremity DVT.   Electronically Signed   By: Maryclare Bean M.D.   On: 10/08/2013 14:58    Microbiology: Recent Results (from the past 240 hour(s))  URINE CULTURE     Status: None   Collection Time    10/06/13  9:25  PM      Result Value Range Status   Specimen Description URINE, CLEAN CATCH   Final   Special Requests NONE   Final   Culture  Setup Time     Final   Value: 10/06/2013 21:55     Performed at [facility] Count     Final   Value: NO GROWTH     Performed at [facility]   Culture     Final   Value: NO GROWTH     Performed at [facility]   Report Status 10/08/2013 FINAL   Final     Labs: Basic Metabolic Panel:  Recent Labs Lab 10/07/13 1035 10/07/13 1357 10/07/13 1836 10/07/13 2354 10/08/13 0354 10/08/13 0804 10/08/13 1137  NA 139 137 139 139 143 142 141  K 2.2* 2.4* 2.6* 3.2* 3.6 4.3 4.2  CL 94* 94* 97 101 108 109 108  CO2 39* 36* 35* 31 29 29 27   GLUCOSE 175* 196* 173* 151* 113* 129* 166*  BUN 3* 3* 3* <3* <3* <3* <3*  CREATININE 0.76 0.72 0.71 0.70 0.69 0.72 0.68  CALCIUM 8.1* 8.0* 8.2* 8.3* 8.0* 8.1* 8.4  MG  --  2.3 2.3  --   --   --   --    Liver Function Tests:  Recent Labs Lab 10/06/13 2037 10/07/13 0529  AST 51* 41*  ALT 15 12  ALKPHOS 153* 129*  BILITOT 1.2 1.2  PROT 6.7 5.8*  ALBUMIN 2.6* 2.3*    Recent Labs Lab 10/06/13 2234  LIPASE 6*   No results found for this basename: AMMONIA,  in the last 168 hours CBC:  Recent Labs Lab 10/06/13 2037 10/07/13 0529  WBC 11.3* 8.6  NEUTROABS 5.1  --   HGB 13.9 12.5  HCT 40.7 36.3  MCV 91.3 89.6  PLT 298 274   Cardiac Enzymes:  Recent Labs Lab 10/06/13 2037 10/07/13 0009 10/07/13 0529 10/07/13 1035  TROPONINI <0.30 <0.30 <0.30 <0.30   BNP: BNP (last 3 results) No results found for this basename: PROBNP,  in the last 8760 hours CBG: No results found for this basename: GLUCAP,  in the last 168 hours     Signed:  Agam Tuohy  [facility] 10/08/2013, 6:42 PM

## 2013-10-10 ENCOUNTER — Encounter (HOSPITAL_COMMUNITY): Payer: Self-pay | Admitting: Internal Medicine

## 2013-11-05 ENCOUNTER — Encounter (HOSPITAL_COMMUNITY): Payer: Self-pay | Admitting: Emergency Medicine

## 2013-11-05 ENCOUNTER — Emergency Department (HOSPITAL_COMMUNITY)
Admission: EM | Admit: 2013-11-05 | Discharge: 2013-11-05 | Disposition: A | Discharge status: Home / Self Care | Payer: Medicare PPO | Attending: Emergency Medicine | Admitting: Emergency Medicine

## 2013-11-05 ENCOUNTER — Emergency Department (HOSPITAL_COMMUNITY): Payer: Medicare PPO

## 2013-11-05 DIAGNOSIS — F19939 Other psychoactive substance use, unspecified with withdrawal, unspecified: Secondary | ICD-10-CM | POA: Insufficient documentation

## 2013-11-05 DIAGNOSIS — Z8739 Personal history of other diseases of the musculoskeletal system and connective tissue: Secondary | ICD-10-CM | POA: Insufficient documentation

## 2013-11-05 DIAGNOSIS — R112 Nausea with vomiting, unspecified: Secondary | ICD-10-CM | POA: Insufficient documentation

## 2013-11-05 DIAGNOSIS — F1120 Opioid dependence, uncomplicated: Secondary | ICD-10-CM | POA: Insufficient documentation

## 2013-11-05 DIAGNOSIS — Z79899 Other long term (current) drug therapy: Secondary | ICD-10-CM | POA: Insufficient documentation

## 2013-11-05 DIAGNOSIS — R110 Nausea: Secondary | ICD-10-CM

## 2013-11-05 DIAGNOSIS — I10 Essential (primary) hypertension: Secondary | ICD-10-CM | POA: Insufficient documentation

## 2013-11-05 DIAGNOSIS — R109 Unspecified abdominal pain: Secondary | ICD-10-CM | POA: Insufficient documentation

## 2013-11-05 DIAGNOSIS — R5381 Other malaise: Secondary | ICD-10-CM | POA: Insufficient documentation

## 2013-11-05 DIAGNOSIS — Z8719 Personal history of other diseases of the digestive system: Secondary | ICD-10-CM | POA: Insufficient documentation

## 2013-11-05 DIAGNOSIS — R197 Diarrhea, unspecified: Secondary | ICD-10-CM | POA: Insufficient documentation

## 2013-11-05 DIAGNOSIS — F17200 Nicotine dependence, unspecified, uncomplicated: Secondary | ICD-10-CM | POA: Insufficient documentation

## 2013-11-05 DIAGNOSIS — E079 Disorder of thyroid, unspecified: Secondary | ICD-10-CM | POA: Insufficient documentation

## 2013-11-05 DIAGNOSIS — F1123 Opioid dependence with withdrawal: Secondary | ICD-10-CM

## 2013-11-05 HISTORY — DX: Disorder of thyroid, unspecified: E07.9

## 2013-11-05 LAB — BASIC METABOLIC PANEL
BUN: 6 mg/dL (ref 6–23)
Calcium: 8.8 mg/dL (ref 8.4–10.5)
Chloride: 106 mEq/L (ref 96–112)
GFR calc Af Amer: >90 mL/min (ref >90)
GFR calc non Af Amer: 84 mL/min — ABNORMAL LOW (ref >90)
Potassium: 3.5 mEq/L (ref 3.5–5.1)
Sodium: 141 mEq/L (ref 135–145)

## 2013-11-05 LAB — CBC WITH DIFFERENTIAL/PLATELET
Basophils Absolute: 0 10*3/uL (ref 0.0–0.1)
Basophils Relative: 0 % (ref 0–1)
Eosinophils Absolute: 0.1 10*3/uL (ref 0.0–0.7)
HCT: 35.6 % — ABNORMAL LOW (ref 36.0–46.0)
Hemoglobin: 11.8 g/dL — ABNORMAL LOW (ref 12.0–15.0)
MCHC: 33.1 g/dL (ref 30.0–36.0)
Monocytes Absolute: 0.8 10*3/uL (ref 0.1–1.0)
Monocytes Relative: 8 % (ref 3–12)
Neutro Abs: 6.6 10*3/uL (ref 1.7–7.7)
Neutrophils Relative %: 69 % (ref 43–77)
Platelets: 344 10*3/uL (ref 150–400)
WBC: 9.6 10*3/uL (ref 4.0–10.5)

## 2013-11-05 LAB — LIPASE, BLOOD: Lipase: 7 U/L — ABNORMAL LOW (ref 11–59)

## 2013-11-05 LAB — HEPATIC FUNCTION PANEL
Alkaline Phosphatase: 136 U/L — ABNORMAL HIGH (ref 39–117)
Bilirubin, Direct: 0.3 mg/dL (ref 0.0–0.3)
Indirect Bilirubin: 0.7 mg/dL (ref 0.3–0.9)
Total Bilirubin: 1 mg/dL (ref 0.3–1.2)
Total Protein: 6.9 g/dL (ref 6.0–8.3)

## 2013-11-05 MED ORDER — MORPHINE SULFATE 4 MG/ML IJ SOLN
4.0000 mg | Freq: Once | INTRAMUSCULAR | Status: AC
Start: 2013-11-05 — End: 2013-11-05
  Administered 2013-11-05: 4 mg via INTRAVENOUS
  Filled 2013-11-05: qty 1

## 2013-11-05 MED ORDER — ONDANSETRON HCL 4 MG/2ML IJ SOLN
4.0000 mg | Freq: Once | INTRAMUSCULAR | Status: AC
  Administered 2013-11-05: 4 mg via INTRAVENOUS
  Filled 2013-11-05: qty 2

## 2013-11-05 MED ORDER — MAGNESIUM SULFATE 40 MG/ML IJ SOLN
2.0000 g | Freq: Once | INTRAMUSCULAR | Status: AC
  Administered 2013-11-05: 2 g via INTRAVENOUS
  Filled 2013-11-05: qty 50

## 2013-11-05 MED ORDER — ONDANSETRON HCL 4 MG PO TABS: 4.0000 mg | ORAL_TABLET | Freq: Four times a day (QID) | ORAL | Status: DC

## 2013-11-05 MED ORDER — SODIUM CHLORIDE 0.9 % IV BOLUS (SEPSIS)
1000.0000 mL | Freq: Once | INTRAVENOUS | Status: AC
  Administered 2013-11-05: 1000 mL via INTRAVENOUS

## 2013-11-05 MED ORDER — POTASSIUM CHLORIDE CRYS ER 20 MEQ PO TBCR
40.0000 meq | EXTENDED_RELEASE_TABLET | Freq: Once | ORAL | Status: AC
  Administered 2013-11-05: 40 meq via ORAL
  Filled 2013-11-05: qty 2

## 2013-11-05 MED ORDER — OXYCODONE-ACETAMINOPHEN 5-325 MG PO TABS: 2.0000 | ORAL_TABLET | ORAL | Status: DC | PRN

## 2013-11-05 NOTE — ED Notes (Signed)
Pt. Handled changing position for orthostatic VS well with no dizziness.

## 2013-11-05 NOTE — ED Notes (Addendum)
Generalized body pain, dizzy,  Vomiting, diarrhea. Chilled. Visual hallucinations. Says she has been "seeing people"

## 2013-11-05 NOTE — ED Provider Notes (Signed)
CSN: [id number]     Arrival date & time 11/05/13  1113 History  This chart was scribed for Glynn Octave, MD by Bennett Scrape, ED Scribe. This patient was seen in room APA15/APA15 and the patient's care was started at 1:42 PM.   Chief Complaint  Patient presents with  . Dizziness    The history is provided by the patient. No language interpreter was used.    HPI Comments: Rhonda Santos is a 67 y.o. female who presents to the Emergency Department complaining of 3 days of weakness with associated myalgias, nausea, emesis, mild lightheadedness and diarrhea after stopping her chronic oxycodone pain medication. She also lists abdominal pain from vomiting a yesterday but denies any today. She lives with her son and also reports that her son told her that she has been having visual hallucinations for the past 3 days. She is unable to describe this further. She states that she stopped her pain medications due to her feeling like she was taking too much. She did not discuss the topping the medication with her PCP and also admits that she missed two appointments with her PCP in the last 2 weeks. Last dose of the oxycodone was 3 to 4 days ago prior to her symptoms starting. She denies any HA, CP, abdominal pain or known fevers. She denies any recent travels. She states that her son is also being seen in the ED for unrelated symptoms. She denies any sick contacts with similar symptoms.     Past Medical History  Diagnosis Date  . Hypertension   . Hyperlipidemia   . DDD (degenerative disc disease)     With chronic pain  . Osteoporosis   . Prolonged Q-T interval on ECG 10/07/2013    Secondary to hypokalemia  . Hypokalemia 10/06/2013    Secondary to lasix  . Tobacco abuse 10/07/2013  . Hypoxia 10/07/2013    From mild resp depression from opioids  . Gallstone   . Diastolic dysfunction 09/2013  . Mitral valve prolapse 09/2013    Mild per echo  . Thyroid disease    Past Surgical History   Procedure Laterality Date  . Thyroid surgery     History reviewed. No pertinent family history. History  Substance Use Topics  . Smoking status: Current Every Day Smoker  . Smokeless tobacco: Not on file  . Alcohol Use: No   No OB history provided.  Review of Systems  A complete 10 system review of systems was obtained and all systems are negative except as noted in the HPI and PMH.   Allergies  Aspirin  Home Medications   Current Outpatient Rx  Name  Route  Sig  Dispense  Refill  . albuterol (PROVENTIL HFA;VENTOLIN HFA) 108 (90 BASE) MCG/ACT inhaler   Inhalation   Inhale 2 puffs into the lungs every 6 (six) hours as needed for wheezing.         Marland Kitchen ALPRAZolam (XANAX) 0.5 MG tablet   Oral   Take 0.5 mg by mouth 2 (two) times daily as needed. For anxiety         . fentaNYL (DURAGESIC - DOSED MCG/HR) 12 MCG/HR   Transdermal   Place 12.5 mcg onto the skin every 3 (three) days.         Marland Kitchen FLUoxetine (PROZAC) 20 MG capsule   Oral   Take 20 mg by mouth every morning.          Marland Kitchen levothyroxine (SYNTHROID, LEVOTHROID) 175 MCG tablet  Oral   Take 175 mcg by mouth daily before breakfast.          . metoprolol tartrate (LOPRESSOR) 25 MG tablet   Oral   Take 25 mg by mouth daily. Prescribed one tablet twice daily         . omeprazole (PRILOSEC) 40 MG capsule   Oral   Take 40 mg by mouth daily before breakfast.          . Oxycodone HCl 20 MG TABS   Oral   Take 1-2 tablets by mouth every 4 (four) hours.         . pseudoephedrine-guaifenesin (MUCINEX D) 60-600 MG per tablet   Oral   Take 1 tablet by mouth every 12 (twelve) hours as needed for congestion (sinus).         . ondansetron (ZOFRAN) 4 MG tablet   Oral   Take 1 tablet (4 mg total) by mouth every 6 (six) hours.   12 tablet   0   . oxyCODONE-acetaminophen (PERCOCET/ROXICET) 5-325 MG per tablet   Oral   Take 2 tablets by mouth every 4 (four) hours as needed for severe pain.   15 tablet    0    Triage Vitals: BP 155/50  Pulse 74  Temp(Src) 98.6 F (37 C) (Oral)  Resp 18  Ht 5\' 6"  (1.676 m)  Wt 140 lb (63.504 kg)  BMI 22.61 kg/m2  SpO2 97%  Physical Exam  Nursing note and vitals reviewed. Constitutional: She is oriented to person, place, and time. She appears well-developed and well-nourished. No distress.  HENT:  Head: Normocephalic and atraumatic.  Dry MM  Eyes: Conjunctivae and EOM are normal. Pupils are equal, round, and reactive to light.  Neck: Neck supple. No tracheal deviation present.  Cardiovascular: Normal rate and regular rhythm.   Pulmonary/Chest: Effort normal and breath sounds normal. No respiratory distress.  Abdominal: Soft. There is no tenderness. There is no rebound.  Musculoskeletal: Normal range of motion.  Neurological: She is alert and oriented to person, place, and time. Coordination normal.  No ataxia on finger to nose, 5/5 strength throughout. No pronator drift.   CN 2-12 intact, no ataxia on finger to nose, no nystagmus, 5/5 strength throughout, no pronator drift, Romberg negative, normal gait.   Skin: Skin is warm and dry.  Psychiatric: She has a normal mood and affect. Her behavior is normal.    ED Course  Procedures (including critical care time)  Medications  ondansetron (ZOFRAN) injection 4 mg (4 mg Intravenous Given 11/05/13 1401)  morphine 4 MG/ML injection 4 mg (4 mg Intravenous Given 11/05/13 1401)  sodium chloride 0.9 % bolus 1,000 mL (1,000 mLs Intravenous New Bag/Given 11/05/13 1400)  potassium chloride SA (K-DUR,KLOR-CON) CR tablet 40 mEq (40 mEq Oral Given 11/05/13 1640)  magnesium sulfate IVPB 2 g 50 mL (2 g Intravenous New Bag/Given 11/05/13 1641)    DIAGNOSTIC STUDIES: Oxygen Saturation is 97% on room air, normal by my interpretation.    COORDINATION OF CARE: 1:49 PM-Discussed treatment plan which includes medications, CT of head, CBC panel, BMP and lipase with pt at bedside and pt agreed to plan.   Labs  Review Labs Reviewed  CBC WITH DIFFERENTIAL - Abnormal; Notable for the following:    RBC 3.82 (*)    Hemoglobin 11.8 (*)    HCT 35.6 (*)    All other components within normal limits  BASIC METABOLIC PANEL - Abnormal; Notable for the following:    Glucose, Bld  116 (*)    GFR calc non Af Amer 84 (*)    All other components within normal limits  HEPATIC FUNCTION PANEL - Abnormal; Notable for the following:    Albumin 2.8 (*)    Alkaline Phosphatase 136 (*)    All other components within normal limits  LIPASE, BLOOD - Abnormal; Notable for the following:    Lipase 7 (*)    All other components within normal limits  TROPONIN I  MAGNESIUM  URINALYSIS, ROUTINE W REFLEX MICROSCOPIC   Imaging Review Ct Head Wo Contrast  11/05/2013   CLINICAL DATA:  Hypertension with dizziness and weakness  EXAM: CT HEAD WITHOUT CONTRAST  TECHNIQUE: Contiguous axial images were obtained from the base of the skull through the vertex without intravenous contrast.  COMPARISON:  10/06/2013  FINDINGS: The bony calvarium is intact. Mild atrophic changes are seen stable from the prior exam. No findings to suggest acute hemorrhage, acute infarction or space-occupying mass lesion are noted.  IMPRESSION: No acute abnormality noted.  Chronic changes are seen.   Electronically Signed   By: Alcide Clever M.D.   On: 11/05/2013 15:13   Mr Brain Wo Contrast  11/05/2013   CLINICAL DATA:  Weakness.  Balance disturbance.  Dizziness.  EXAM: MRI HEAD WITHOUT CONTRAST  TECHNIQUE: Multiplanar, multiecho pulse sequences of the brain and surrounding structures were obtained without intravenous contrast.  COMPARISON:  Head CT same day.  Head CT 10/06/2013  FINDINGS: The brain has a normal appearance on all pulse sequences without evidence of malformation, atrophy, old or acute infarction, mass lesion, hemorrhage, hydrocephalus or extra-axial collection. No pituitary mass. Paranasal sinuses are clear. There is some fluid in the mastoid air  cells right more than left.  IMPRESSION: Normal appearance of the brain itself. Mastoid effusions right larger than left.   Electronically Signed   By: Paulina Fusi M.D.   On: 11/05/2013 16:25    EKG Interpretation     Ventricular Rate:  70 PR Interval:  190 QRS Duration: 112 QT Interval:  464 QTC Calculation: 501 R Axis:   -54 Text Interpretation:  Normal sinus rhythm Left axis deviation Incomplete right bundle branch block Prolonged QT Abnormal ECG When compared with ECG of 08-Oct-2013 03:57, Criteria for Anterior infarct are no longer Present Nonspecific T wave abnormality, improved in Anterolateral leads No significant change was found            MDM   1. Opiate withdrawal   2. Nausea    3 day history of lightheadedness, nausea, vomiting, diarrhea and generalized weakness. Symptoms coincide with stopping chronic pain medications 3 days ago. Recent admission for hypokalemia attributed to Lasix.  Orthostatics negative. EKG with normal sinus rhythm. QTc 500. Potassium today is within normal limits. Magnesium is normal as well.  Patient feels much improved after antiemetics and IV fluids. Suspect her symptoms are secondary to opiate withdrawal. No evidence of CVA or TIA. She denies abdominal pain, chest pain. She is able to ambulate without assistance and is tolerating by mouth.  On reassessment, patient has dressed herself and is walking in the hallways without distress. She is tolerating by mouth. She states she feels back to baseline. She'll followup for recheck of her potassium next week. We'll provide short course of pain medication to prevent withdrawals.  I personally performed the services described in this documentation, which was scribed in my presence. The recorded information has been reviewed and is accurate.   Glynn Octave, MD 11/05/13 [phone]

## 2013-11-05 NOTE — ED Notes (Signed)
Pt has currently gone to radiology for CT scan and MRI

## 2013-12-19 IMAGING — US US ABDOMEN COMPLETE
1 series · 14 of 25 positions shown · non-contrast
Comparison: None.

CLINICAL DATA: Elevated liver function tests.

EXAM:
ULTRASOUND ABDOMEN COMPLETE

[Series 1: us abdomen complete · 0.20mm/px · 14 of 104 slices shown]
[im 1/104]
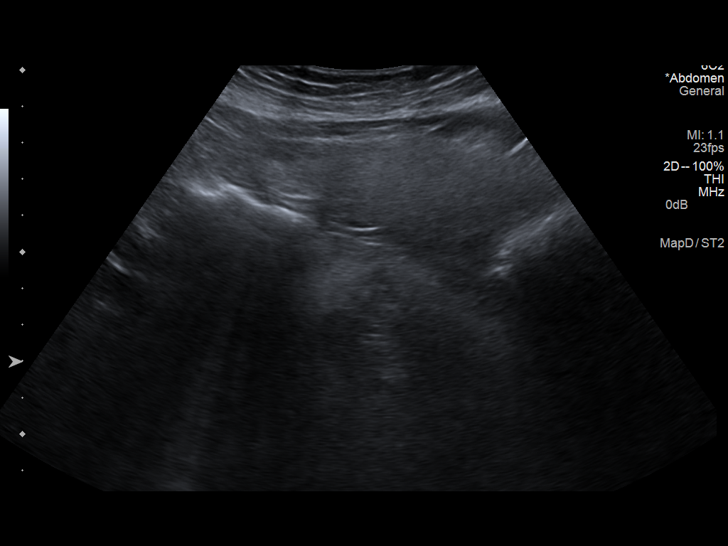
[im 9/104]
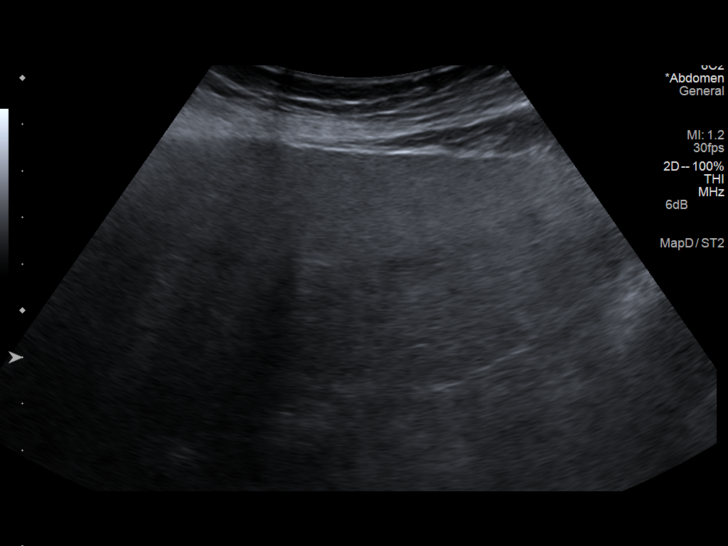
[im 18/104]
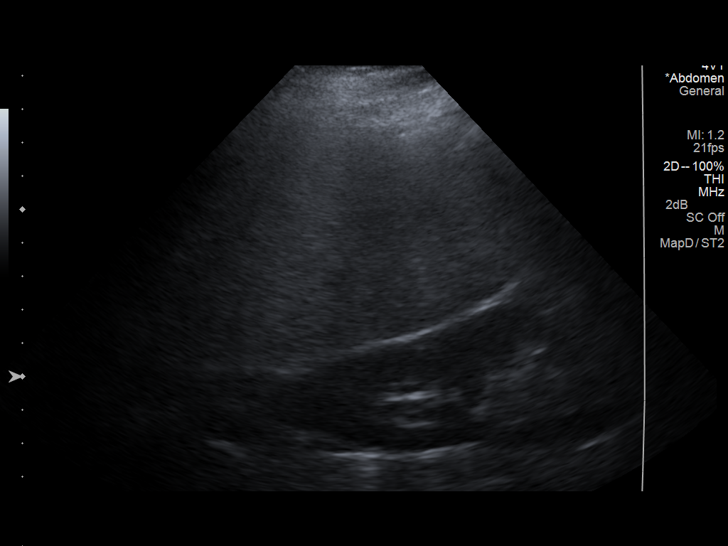
[im 26/104]
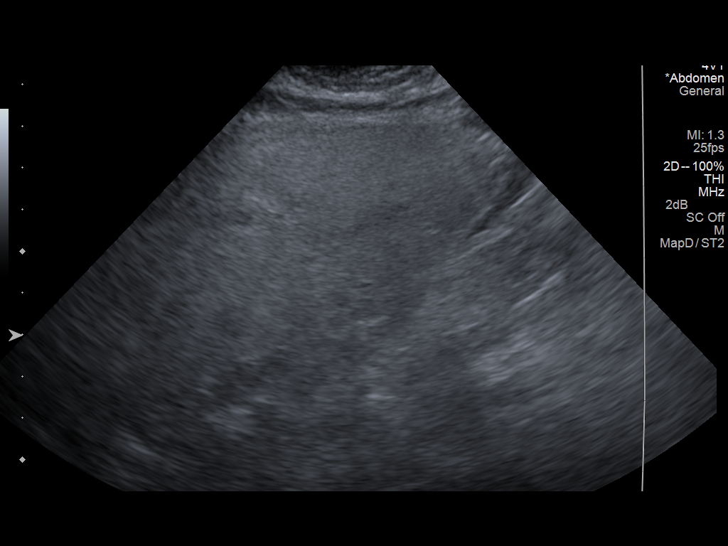
[im 35/104]
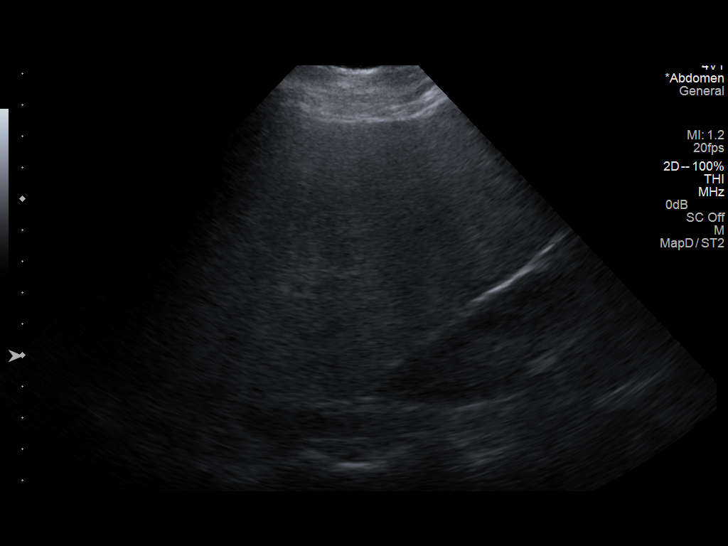
[im 39/104]
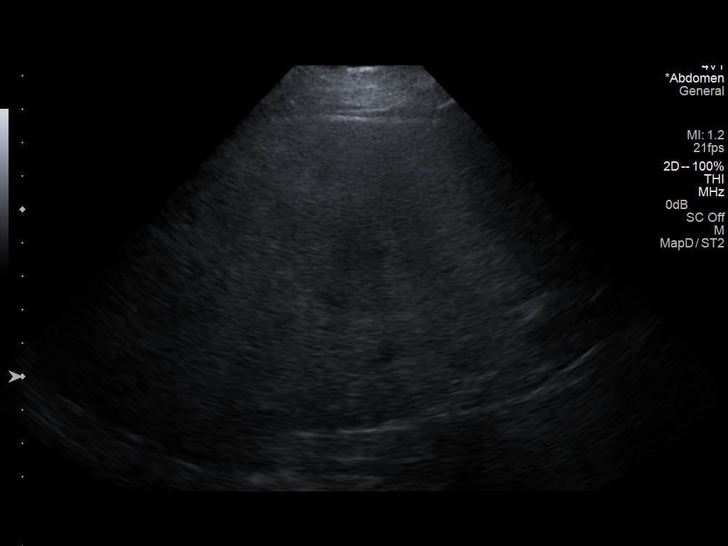
[im 48/104]
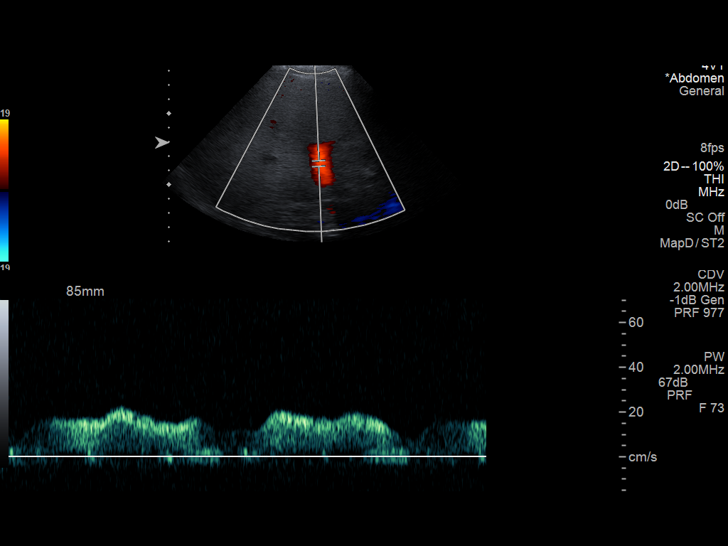
[im 56/104]
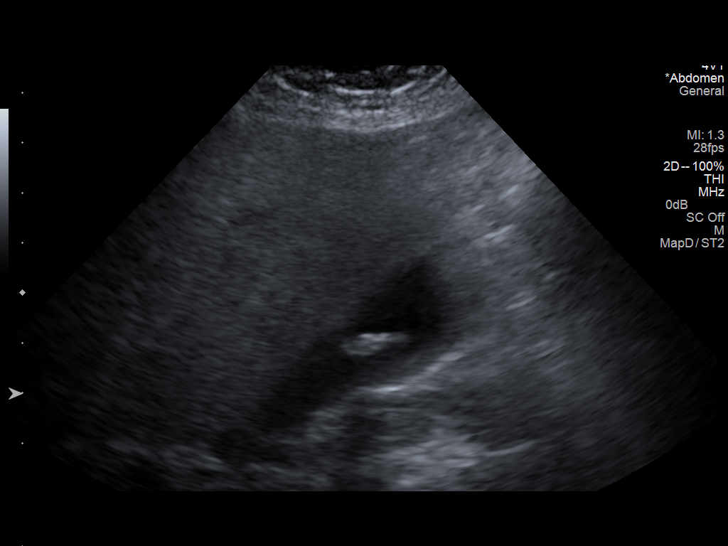
[im 65/104]
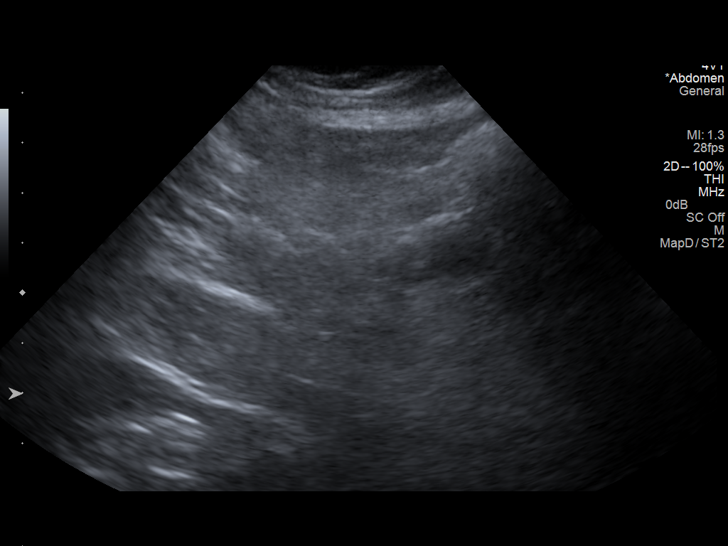
[im 69/104]
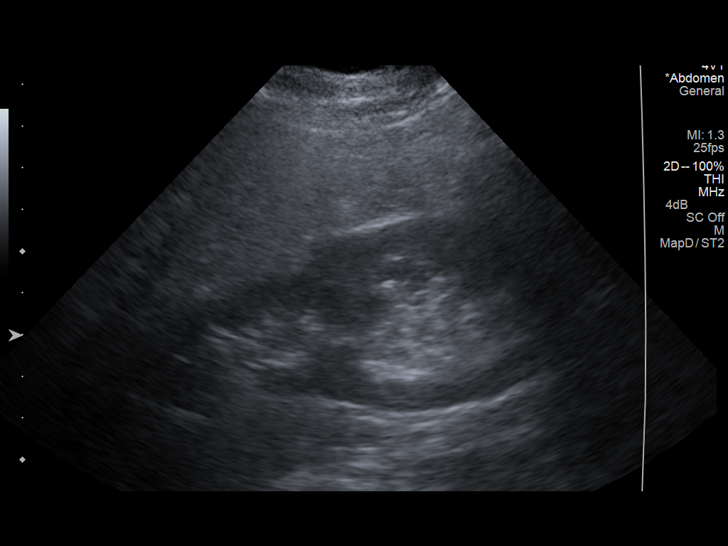
[im 78/104]
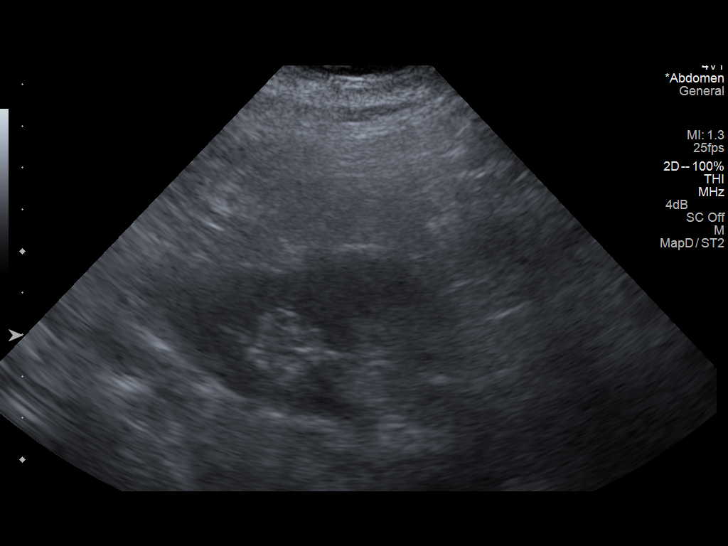
[im 86/104]
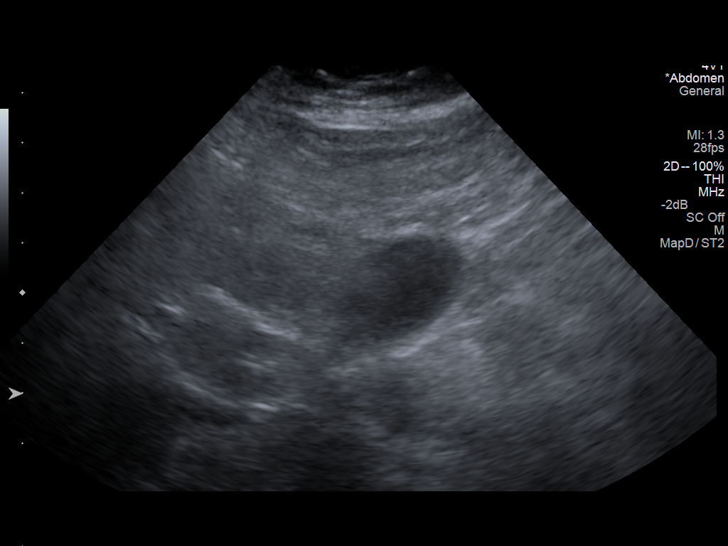
[im 95/104]
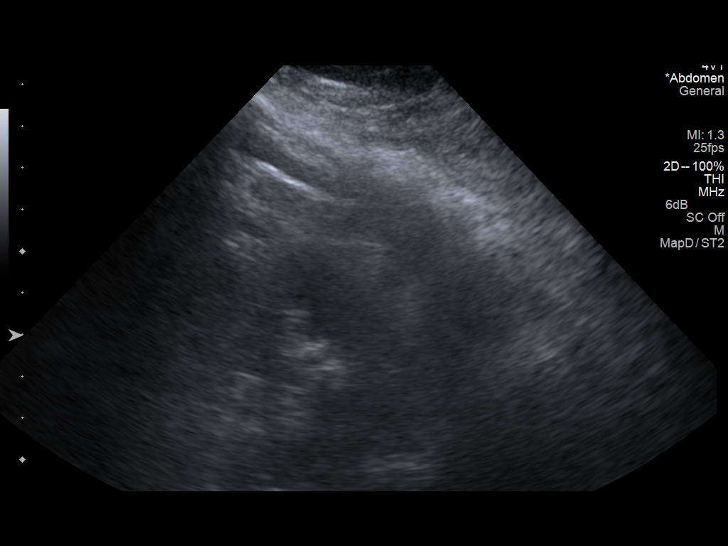
[im 104/104]
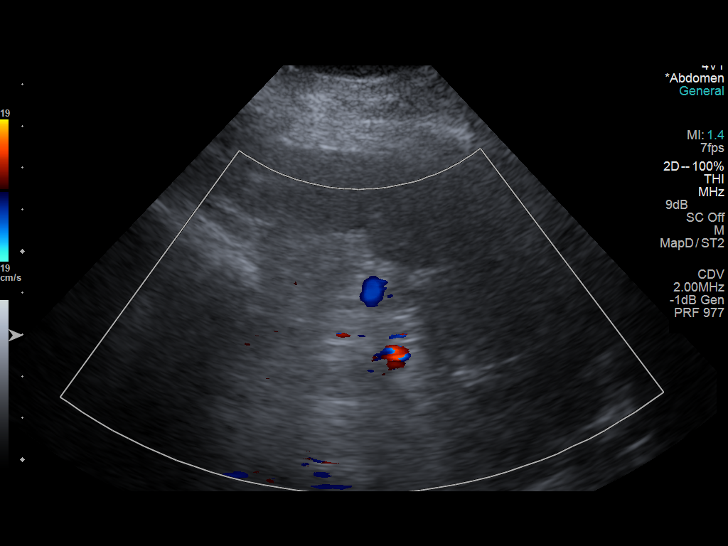

[14 of 25 positions shown; findings below may reference images not displayed]

FINDINGS: Gallbladder

There is a single 1.3 cm stone in the gallbladder. Gallbladder wall
is not thickened. Negative sonographic Murphy's sign.

Common bile duct

Diameter: Normal. 3 mm in diameter.

Liver

Hepatomegaly with diffuse increased echogenicity of slight
heterogeneity without focal lesions. Findings consistent with
hepatic steatosis.

IVC

Not visible due to bowel gas.

Pancreas

Not well seen due to bowel gas.

Spleen

Normal. 7.9 cm in length.

Right Kidney

Length: 10.8 cm. Echogenicity within normal limits. No mass or
hydronephrosis visualized.

Left Kidney

Length: 9.3 cm. Echogenicity within normal limits. No mass or
hydronephrosis visualized.

Abdominal aorta

Obscured by bowel gas.
IMPRESSION: 1. Single gallstone.
2. The pancreas, inferior vena cava, and abdominal aorta are
obscured by bowel gas.
3. Hepatomegaly with hepatic steatosis.

## 2013-12-23 DIAGNOSIS — F11921 Opioid use, unspecified with intoxication delirium: Secondary | ICD-10-CM

## 2013-12-23 HISTORY — DX: Opioid use, unspecified with intoxication delirium: F11.921

## 2014-01-19 ENCOUNTER — Encounter (HOSPITAL_COMMUNITY): Payer: Self-pay | Admitting: Emergency Medicine

## 2014-01-19 ENCOUNTER — Emergency Department (HOSPITAL_COMMUNITY)
Admission: EM | Admit: 2014-01-19 | Discharge: 2014-01-19 | Disposition: A | Discharge status: Home / Self Care | Payer: Medicare PPO | Attending: Emergency Medicine | Admitting: Emergency Medicine

## 2014-01-19 ENCOUNTER — Emergency Department (HOSPITAL_COMMUNITY): Payer: Medicare PPO

## 2014-01-19 ENCOUNTER — Other Ambulatory Visit: Payer: Self-pay

## 2014-01-19 DIAGNOSIS — I059 Rheumatic mitral valve disease, unspecified: Secondary | ICD-10-CM | POA: Insufficient documentation

## 2014-01-19 DIAGNOSIS — E079 Disorder of thyroid, unspecified: Secondary | ICD-10-CM | POA: Insufficient documentation

## 2014-01-19 DIAGNOSIS — I10 Essential (primary) hypertension: Secondary | ICD-10-CM | POA: Insufficient documentation

## 2014-01-19 DIAGNOSIS — Z8739 Personal history of other diseases of the musculoskeletal system and connective tissue: Secondary | ICD-10-CM | POA: Insufficient documentation

## 2014-01-19 DIAGNOSIS — Z8719 Personal history of other diseases of the digestive system: Secondary | ICD-10-CM | POA: Insufficient documentation

## 2014-01-19 DIAGNOSIS — R443 Hallucinations, unspecified: Secondary | ICD-10-CM | POA: Insufficient documentation

## 2014-01-19 DIAGNOSIS — F22 Delusional disorders: Secondary | ICD-10-CM | POA: Insufficient documentation

## 2014-01-19 DIAGNOSIS — F17200 Nicotine dependence, unspecified, uncomplicated: Secondary | ICD-10-CM | POA: Insufficient documentation

## 2014-01-19 DIAGNOSIS — Z79899 Other long term (current) drug therapy: Secondary | ICD-10-CM | POA: Insufficient documentation

## 2014-01-19 LAB — CBC WITH DIFFERENTIAL/PLATELET
Basophils Absolute: 0.1 10*3/uL (ref 0.0–0.1)
Basophils Relative: 1 % (ref 0–1)
EOS PCT: 3 % (ref 0–5)
Eosinophils Absolute: 0.2 10*3/uL (ref 0.0–0.7)
HCT: 39.2 % (ref 36.0–46.0)
Hemoglobin: 13.2 g/dL (ref 12.0–15.0)
LYMPHS PCT: 41 % (ref 12–46)
Lymphs Abs: 2.6 10*3/uL (ref 0.7–4.0)
MCH: 31.4 pg (ref 26.0–34.0)
MCHC: 33.7 g/dL (ref 30.0–36.0)
MCV: 93.1 fL (ref 78.0–100.0)
Monocytes Absolute: 0.6 10*3/uL (ref 0.1–1.0)
Monocytes Relative: 10 % (ref 3–12)
Neutro Abs: 2.9 10*3/uL (ref 1.7–7.7)
Neutrophils Relative %: 46 % (ref 43–77)
PLATELETS: 300 10*3/uL (ref 150–400)
RBC: 4.21 MIL/uL (ref 3.87–5.11)
RDW: 13.2 % (ref 11.5–15.5)
WBC: 6.4 10*3/uL (ref 4.0–10.5)

## 2014-01-19 LAB — URINALYSIS, ROUTINE W REFLEX MICROSCOPIC
Bilirubin Urine: NEGATIVE
GLUCOSE, UA: NEGATIVE mg/dL
Hgb urine dipstick: NEGATIVE
Ketones, ur: NEGATIVE mg/dL
Leukocytes, UA: NEGATIVE
Nitrite: NEGATIVE
Protein, ur: NEGATIVE mg/dL
Specific Gravity, Urine: <1.005 — ABNORMAL LOW (ref 1.005–1.030)
UROBILINOGEN UA: 0.2 mg/dL (ref 0.0–1.0)
pH: 7 (ref 5.0–8.0)

## 2014-01-19 LAB — COMPREHENSIVE METABOLIC PANEL
ALBUMIN: 3.5 g/dL (ref 3.5–5.2)
ALK PHOS: 135 U/L — AB (ref 39–117)
ALT: 20 U/L (ref 0–35)
AST: 43 U/L — ABNORMAL HIGH (ref 0–37)
BILIRUBIN TOTAL: 0.5 mg/dL (ref 0.3–1.2)
BUN: 5 mg/dL — ABNORMAL LOW (ref 6–23)
CHLORIDE: 103 meq/L (ref 96–112)
CO2: 28 mEq/L (ref 19–32)
Calcium: 9.2 mg/dL (ref 8.4–10.5)
Creatinine, Ser: 0.85 mg/dL (ref 0.50–1.10)
GFR calc Af Amer: 79 mL/min — ABNORMAL LOW (ref >90)
GFR calc non Af Amer: 68 mL/min — ABNORMAL LOW (ref >90)
Glucose, Bld: 103 mg/dL — ABNORMAL HIGH (ref 70–99)
POTASSIUM: 3.4 meq/L — AB (ref 3.7–5.3)
SODIUM: 142 meq/L (ref 137–147)
Total Protein: 7.6 g/dL (ref 6.0–8.3)

## 2014-01-19 LAB — LACTIC ACID, PLASMA: Lactic Acid, Venous: 1 mmol/L (ref 0.5–2.2)

## 2014-01-19 LAB — PROTIME-INR
INR: 1.17 (ref 0.00–1.49)
PROTHROMBIN TIME: 14.7 s (ref 11.6–15.2)

## 2014-01-19 MED ORDER — SODIUM CHLORIDE 0.9 % IV SOLN: 1000.0000 mL | INTRAVENOUS | Status: DC

## 2014-01-19 MED ORDER — SODIUM CHLORIDE 0.9 % IV SOLN
1000.0000 mL | Freq: Once | INTRAVENOUS | Status: AC
  Administered 2014-01-19: 1000 mL via INTRAVENOUS

## 2014-01-19 NOTE — Discharge Instructions (Signed)
As discussed, your evaluation here has been largely reassuring.  Your hallucinations may be due to medication interaction, possibly including your significant doses of opiate medication.  Is important that your physician tomorrow to insure appropriate ongoing evaluation and management.  Return for concerning changes in her condition. Hallucinations and Delusions You seem to be having hallucinations and/or delusions. You may be hearing voices that no one else can hear. This can seem very real to you. You may be having thoughts and fears that do not make sense to others. This condition can be due to mental disease like schizophrenia. It may be caused by a medical condition, such as an infection or electrolyte disturbance. These symptoms are also seen in drug abusers, especially those who use crack cocaine and amphetamines. Drugs like PCP, LSD, MDMA, peyote, and psilocybin can also cause frightening hallucinations and loss of control. If your symptoms are due to drug abuse, your mental state should improve as the drug(s) leave your system. Someone you trust should be with you until you are better to protect you and calm your fears. Often tranquilizers are very helpful at controlling hallucinations, anxiety, and destructive behavior. Getting a proper diet and enough sleep is important to recovery. If your symptoms are not due to drugs, or do not improve over several days after stopping drug use, you need further medical or mental health care. SEEK IMMEDIATE MEDICAL CARE IF:   Your symptoms get worse, especially if you think your life is in danger  You have violent or destructive thoughts. Recovery is possible, but you have to get proper treatment and avoid drugs that are known to cause you trouble. Document Released: 01/16/2005 Document Revised: 03/02/2012 Document Reviewed: 12/09/2005 [facility] Patient Information 2014 [place].

## 2014-01-19 NOTE — ED Notes (Signed)
Pt states she is seeing things other people are not seeing. Pt states that across the street from her x 1 month ago she began "seeing people from other countries and Indians staring at her home". pt states "i just need somebody to go in my mind and tell them I'm telling the truth", "I'm scared for my son because he is a sex offender and these people are going to harm him". Pt also hearing voices. Pt states the voices told her she "needs to be out of the house by Friday". Denies SI/HI.

## 2014-01-19 NOTE — ED Provider Notes (Signed)
CSN: [id number]     Arrival date & time 01/19/14  1639 History  This chart was scribed for Carmin Muskrat, MD by Zettie Pho, ED Scribe. This patient was seen in room APA11/APA11 and the patient's care was started at 6:07 PM.    Chief Complaint  Patient presents with  . Hallucinations   The history is provided by the patient. No language interpreter was used.   HPI Comments: Rhonda Santos is a 68 y.o. female who presents to the Emergency Department complaining of hallucinations onset about 3 months ago that has been progressively worsening. She states she sees "other people, like Native Americans, across the street" and she "fears for the safety of my family and I feel safe at the hospital, but not at home." Her husband states that the patient hears people in the attic and she states someone told her "she has to be out by Friday." Her husband states that the patient was seen here a few weeks ago for similar complaints and was told she had an elevated potassium level and received imaging of her head that was negative. She denies suicidal or homicidal ideations, nausea, or any other symptoms at this time. She denies any recent changes in diet or falls/trauma. She states that she recently started taking lasix, but that the hallucinations presented before the medication change. Patient has a history of HTN, hyperlipidemia, hypokalemia, mitral valve prolapse, and thyroid disease.   Past Medical History  Diagnosis Date  . Hypertension   . Hyperlipidemia   . DDD (degenerative disc disease)     With chronic pain  . Osteoporosis   . Prolonged Q-T interval on ECG 10/07/2013    Secondary to hypokalemia  . Hypokalemia 10/06/2013    Secondary to lasix  . Tobacco abuse 10/07/2013  . Hypoxia 10/07/2013    From mild resp depression from opioids  . Gallstone   . Diastolic dysfunction 37/1062  . Mitral valve prolapse 09/2013    Mild per echo  . Thyroid disease    Past Surgical History  Procedure  Laterality Date  . Thyroid surgery     History reviewed. No pertinent family history. History  Substance Use Topics  . Smoking status: Current Every Day Smoker  . Smokeless tobacco: Not on file  . Alcohol Use: No   OB History   Grav Para Term Preterm Abortions TAB SAB Ect Mult Living                 Review of Systems  Constitutional:       Per HPI, otherwise negative  HENT:       Per HPI, otherwise negative  Respiratory:       Per HPI, otherwise negative  Cardiovascular:       Per HPI, otherwise negative  Gastrointestinal: Negative for nausea and vomiting.  Endocrine:       Negative aside from HPI  Genitourinary:       Neg aside from HPI   Musculoskeletal:       Per HPI, otherwise negative  Skin: Negative.   Neurological: Negative for syncope.  Psychiatric/Behavioral: Positive for hallucinations. Negative for suicidal ideas.    Allergies  Aspirin  Home Medications   Current Outpatient Rx  Name  Route  Sig  Dispense  Refill  . albuterol (PROVENTIL HFA;VENTOLIN HFA) 108 (90 BASE) MCG/ACT inhaler   Inhalation   Inhale 2 puffs into the lungs every 6 (six) hours as needed for wheezing.         Marland Kitchen  ALPRAZolam (XANAX) 0.5 MG tablet   Oral   Take 0.5 mg by mouth 2 (two) times daily as needed. For anxiety         . fentaNYL (DURAGESIC - DOSED MCG/HR) 12 MCG/HR   Transdermal   Place 12.5 mcg onto the skin every 3 (three) days.         Marland Kitchen FLUoxetine (PROZAC) 20 MG capsule   Oral   Take 20 mg by mouth every morning.          Marland Kitchen levothyroxine (SYNTHROID, LEVOTHROID) 175 MCG tablet   Oral   Take 175 mcg by mouth daily before breakfast.          . metoprolol tartrate (LOPRESSOR) 25 MG tablet   Oral   Take 25 mg by mouth daily. Prescribed one tablet twice daily         . omeprazole (PRILOSEC) 40 MG capsule   Oral   Take 40 mg by mouth daily before breakfast.          . ondansetron (ZOFRAN) 4 MG tablet   Oral   Take 1 tablet (4 mg total) by mouth  every 6 (six) hours.   12 tablet   0   . Oxycodone HCl 20 MG TABS   Oral   Take 1-2 tablets by mouth every 4 (four) hours.         Marland Kitchen oxyCODONE-acetaminophen (PERCOCET/ROXICET) 5-325 MG per tablet   Oral   Take 2 tablets by mouth every 4 (four) hours as needed for severe pain.   15 tablet   0   . pseudoephedrine-guaifenesin (MUCINEX D) 60-600 MG per tablet   Oral   Take 1 tablet by mouth every 12 (twelve) hours as needed for congestion (sinus).          Triage Vitals: 156/56  Pulse 70  Temp(Src) 97.9 F (36.6 C) (Oral)  Resp 18  SpO2 95%  Physical Exam  Nursing note and vitals reviewed. Constitutional: She is oriented to person, place, and time. She appears well-developed and well-nourished. No distress.  HENT:  Head: Normocephalic and atraumatic.  Eyes: Conjunctivae and EOM are normal.  Cardiovascular: Normal rate and regular rhythm.   Pulmonary/Chest: Effort normal and breath sounds normal. No stridor. No respiratory distress.  Abdominal: She exhibits no distension.  Musculoskeletal: She exhibits no edema.  Neurological: She is alert and oriented to person, place, and time. No cranial nerve deficit.  Skin: Skin is warm and dry.  Psychiatric: She has a normal mood and affect. Her speech is normal. She is actively hallucinating. Thought content is paranoid. She expresses no homicidal and no suicidal ideation.    ED Course  Procedures (including critical care time)  COORDINATION OF CARE: 6:13 PM- Will order blood labs, UA, an EKG, a head CT, and a chest x-ray. Will order IV fluids to manage symptoms. Discussed treatment plan with patient at bedside and patient verbalized agreement.     Labs Review Labs Reviewed  COMPREHENSIVE METABOLIC PANEL - Abnormal; Notable for the following:    Potassium 3.4 (*)    Glucose, Bld 103 (*)    BUN 5 (*)    AST 43 (*)    Alkaline Phosphatase 135 (*)    GFR calc non Af Amer 68 (*)    GFR calc Af Amer 79 (*)    All other  components within normal limits  URINALYSIS, ROUTINE W REFLEX MICROSCOPIC - Abnormal; Notable for the following:    Color, Urine STRAW (*)  Specific Gravity, Urine <1.005 (*)    All other components within normal limits  LACTIC ACID, PLASMA  PROTIME-INR  CBC WITH DIFFERENTIAL   Imaging Review Ct Head Wo Contrast  01/19/2014   CLINICAL DATA:  Hallucinations.  EXAM: CT HEAD WITHOUT CONTRAST  TECHNIQUE: Contiguous axial images were obtained from the base of the skull through the vertex without intravenous contrast.  COMPARISON:  MRI brain 11/05/2013.  FINDINGS: No acute infarct, hemorrhage, or mass lesion is present. Ventricles are of normal size. No significant extra-axial fluid collection is present.  The paranasal sinuses and mastoid air cells are clear. The osseous skull is intact. Atherosclerotic calcifications are present within the cavernous carotid arteries bilaterally.  IMPRESSION: 1. Normal CT appearance of the brain. 2. Atherosclerotic calcifications within the cavernous carotid arteries bilaterally. 3. Interval resolution of mastoid effusions.   Electronically Signed   By: Lawrence Santiago M.D.   On: 01/19/2014 19:20   Dg Chest Port 1 View  01/19/2014   CLINICAL DATA:  Hallucinations  EXAM: PORTABLE CHEST - 1 VIEW  COMPARISON:  10/06/2013  FINDINGS: Lungs are essentially clear. No focal consolidation or frank interstitial edema. No pleural effusion or pneumothorax.  Cardiomegaly.  IMPRESSION: No evidence of acute cardiopulmonary disease.   Electronically Signed   By: Julian Hy M.D.   On: 01/19/2014 18:56    Date: 01/19/2014  Rate: 62  Rhythm: normal sinus rhythm  QRS Axis: left  Intervals: normal  ST/T Wave abnormalities: normal  Conduction Disutrbances:nonspecific intraventricular conduction delay  Narrative Interpretation:   Old EKG Reviewed: none available ABNORMAL  Patient now adds that she takes up to 60 mg of oxycodone every 4 hours MDM   Patient presents with  concerns of hallucinations for several months.  On exam patient is awake, alert, hemodynamically stable, neurovascularly intact.  Patient has visual hallucination, but no other overt psychiatric disturbance.  Patient history of psychiatric disease.  Patient's evaluation here, head CT, labs was largely reassuring.  Patient's presentation is likely secondary to medication effects, given her substantial use of opiate medication. The patient continues to hallucination, she is appropriate for further evaluation, management as an outpatient.    Carmin Muskrat, MD 01/19/14 2057

## 2014-01-19 NOTE — ED Notes (Signed)
Pt reports visual hallucinations x2-3 months. Pt states "I keep seeing people that are not there". Pt has been seen by MD previously and states "they never tell me what's wrong".

## 2014-03-30 ENCOUNTER — Ambulatory Visit (HOSPITAL_COMMUNITY): Payer: Medicare PPO | Admitting: Psychiatry

## 2014-04-21 ENCOUNTER — Emergency Department (HOSPITAL_COMMUNITY)
Admission: EM | Admit: 2014-04-21 | Discharge: 2014-04-21 | Disposition: A | Discharge status: Home / Self Care | Payer: Medicare PPO | Attending: Emergency Medicine | Admitting: Emergency Medicine

## 2014-04-21 ENCOUNTER — Encounter (HOSPITAL_COMMUNITY): Payer: Self-pay | Admitting: Emergency Medicine

## 2014-04-21 DIAGNOSIS — Z760 Encounter for issue of repeat prescription: Secondary | ICD-10-CM | POA: Insufficient documentation

## 2014-04-21 DIAGNOSIS — G8929 Other chronic pain: Secondary | ICD-10-CM

## 2014-04-21 DIAGNOSIS — Z79899 Other long term (current) drug therapy: Secondary | ICD-10-CM | POA: Insufficient documentation

## 2014-04-21 DIAGNOSIS — E079 Disorder of thyroid, unspecified: Secondary | ICD-10-CM | POA: Insufficient documentation

## 2014-04-21 DIAGNOSIS — M810 Age-related osteoporosis without current pathological fracture: Secondary | ICD-10-CM | POA: Insufficient documentation

## 2014-04-21 DIAGNOSIS — E785 Hyperlipidemia, unspecified: Secondary | ICD-10-CM | POA: Insufficient documentation

## 2014-04-21 DIAGNOSIS — I10 Essential (primary) hypertension: Secondary | ICD-10-CM | POA: Insufficient documentation

## 2014-04-21 DIAGNOSIS — Z791 Long term (current) use of non-steroidal anti-inflammatories (NSAID): Secondary | ICD-10-CM | POA: Insufficient documentation

## 2014-04-21 DIAGNOSIS — M1990 Unspecified osteoarthritis, unspecified site: Secondary | ICD-10-CM | POA: Insufficient documentation

## 2014-04-21 DIAGNOSIS — F17200 Nicotine dependence, unspecified, uncomplicated: Secondary | ICD-10-CM | POA: Insufficient documentation

## 2014-04-21 MED ORDER — MELOXICAM 7.5 MG PO TABS: 7.5000 mg | ORAL_TABLET | Freq: Every day | ORAL | Status: DC

## 2014-04-21 NOTE — ED Notes (Signed)
Patient with no complaints at this time. Respirations even and unlabored. Skin warm/dry. Discharge instructions reviewed with patient at this time. Patient given opportunity to voice concerns/ask questions. Patient discharged at this time and left Emergency Department with steady gait.   

## 2014-04-21 NOTE — ED Notes (Signed)
Pt reports needed more pain medication and called PCP and they told her she " has been taking too many because I shouldn't be out" pt here for pain medication refill.

## 2014-04-21 NOTE — ED Provider Notes (Signed)
Medical screening examination/treatment/procedure(s) were performed by non-physician practitioner and as supervising physician I was immediately available for consultation/collaboration.   EKG Interpretation None      Rolland Porter, MD, Abram Sander   Janice Norrie, MD 04/21/14 2346

## 2014-04-21 NOTE — ED Provider Notes (Signed)
CSN: [id number]     Arrival date & time 04/21/14  1554 History   First MD Initiated Contact with Patient 04/21/14 1601     Chief Complaint  Patient presents with  . Medication Refill     (Consider location/radiation/quality/duration/timing/severity/associated sxs/prior Treatment) HPI Comments: Rhonda Santos is a 68 y.o. Female presenting for pain medication refill.  She is treated for chronic osteoporosis and osteoarthritis pain by her primary doctor with a combination of hydromorphone 4 mg and oxycodone 20 mg tablets.  She called to get a refill today, but was told by her providers office that it is too soon,  She cannot get refills yet.  She denies taking too many tablets,  But states the last time she filled her pain pill (she cannot name which one, but states its the "little blue pill", the pharmacy did not give her enough.  She denies fevers, chills, nausea, vomiting, tremor or abdominal pain.  She does endorse increased body aches.       The history is provided by the patient.    Past Medical History  Diagnosis Date  . Hypertension   . Hyperlipidemia   . DDD (degenerative disc disease)     With chronic pain  . Osteoporosis   . Prolonged Q-T interval on ECG 10/07/2013    Secondary to hypokalemia  . Hypokalemia 10/06/2013    Secondary to lasix  . Tobacco abuse 10/07/2013  . Hypoxia 10/07/2013    From mild resp depression from opioids  . Gallstone   . Diastolic dysfunction 76/7341  . Mitral valve prolapse 09/2013    Mild per echo  . Thyroid disease    Past Surgical History  Procedure Laterality Date  . Thyroid surgery     No family history on file. History  Substance Use Topics  . Smoking status: Current Every Day Smoker  . Smokeless tobacco: Not on file  . Alcohol Use: No   OB History   Grav Para Term Preterm Abortions TAB SAB Ect Mult Living                 Review of Systems  Constitutional: Negative for fever.  HENT: Negative for congestion and sore  throat.   Eyes: Negative.   Respiratory: Negative for chest tightness and shortness of breath.   Cardiovascular: Negative for chest pain.  Gastrointestinal: Negative for nausea, vomiting and abdominal pain.  Genitourinary: Negative.   Musculoskeletal: Positive for arthralgias. Negative for joint swelling and neck pain.  Skin: Negative.  Negative for rash and wound.  Neurological: Negative for dizziness, weakness, light-headedness, numbness and headaches.  Psychiatric/Behavioral: Negative.  Negative for agitation.      Allergies  Aspirin  Home Medications   Prior to Admission medications   Medication Sig Start Date End Date Taking? Authorizing Provider  albuterol (PROVENTIL HFA;VENTOLIN HFA) 108 (90 BASE) MCG/ACT inhaler Inhale 2 puffs into the lungs every 6 (six) hours as needed for wheezing.    Historical Provider, MD  ALPRAZolam Duanne Moron) 0.5 MG tablet Take 0.5 mg by mouth 2 (two) times daily. For anxiety 10/04/13   Historical Provider, MD  bisacodyl (BISACODYL) 5 MG EC tablet Take 5 mg by mouth daily as needed for moderate constipation.    Historical Provider, MD  cephALEXin (KEFLEX) 500 MG capsule Take 500 mg by mouth 4 (four) times daily.    Historical Provider, MD  furosemide (LASIX) 40 MG tablet Take 40 mg by mouth 2 (two) times daily.    Historical Provider, MD  levothyroxine (SYNTHROID, LEVOTHROID) 175 MCG tablet Take 175 mcg by mouth daily before breakfast.  09/28/13   Historical Provider, MD  metoprolol tartrate (LOPRESSOR) 25 MG tablet Take 25 mg by mouth 2 (two) times daily.  08/24/13   Historical Provider, MD  omeprazole (PRILOSEC) 40 MG capsule Take 40 mg by mouth daily before breakfast.  09/28/13   Historical Provider, MD  oxycodone (ROXICODONE) 30 MG immediate release tablet Take 30-60 mg by mouth every 4 (four) hours as needed for pain.    Historical Provider, MD  potassium chloride (K-DUR) 10 MEQ tablet Take 10 mEq by mouth 3 (three) times daily.    Historical Provider, MD    BP 145/62  Pulse 82  Temp(Src) 97.7 F (36.5 C) (Oral)  Resp 18  Ht 5\' 1"  (1.549 m)  SpO2 100% Physical Exam  Nursing note and vitals reviewed. Constitutional: She is oriented to person, place, and time. She appears well-developed and well-nourished. No distress.  HENT:  Head: Normocephalic and atraumatic.  Eyes: Conjunctivae are normal.  Neck: Normal range of motion.  Cardiovascular: Normal rate, regular rhythm, normal heart sounds and intact distal pulses.   Pulmonary/Chest: Effort normal and breath sounds normal. She has no wheezes.  Abdominal: Soft. Bowel sounds are normal. There is no tenderness.  Musculoskeletal: Normal range of motion.  Neurological: She is alert and oriented to person, place, and time.  Skin: Skin is warm and dry.  Psychiatric: She has a normal mood and affect.    ED Course  Procedures (including critical care time) Labs Review Labs Reviewed - No data to display  Imaging Review No results found.   EKG Interpretation None      MDM   Final diagnoses:  Chronic pain    Pt advised that her pain medicines need to come from her pcp and that if her pharmacy did not give her the full prescription,  She should be able to get the remainder and she should f/u with them.  She was given a prescription for meloxicam which may also help her pain.  Review of the [place] narcotic database reveals she received on 03/30/14 #90 hydromorphone 4 mg tabs,  #240 oxycodone 20 mg tabs.  Additionally,  She got #60 alprazolam 0.5 mg tabs on 04/04/14.    Evalee Jefferson, PA-C 04/21/14 [phone]

## 2014-04-21 NOTE — Discharge Instructions (Signed)
Chronic Pain Chronic pain can be defined as pain that is off and on and lasts for 3 6 months or longer. Many things cause chronic pain, which can make it difficult to make a diagnosis. There are many treatment options available for chronic pain. However, finding a treatment that works well for you may require trying various approaches until the right one is found. Many people benefit from a combination of two or more types of treatment to control their pain. SYMPTOMS  Chronic pain can occur anywhere in the body and can range from mild to very severe. Some types of chronic pain include:  Headache.  Low back pain.  Cancer pain.  Arthritis pain.  Neurogenic pain. This is pain resulting from damage to nerves. People with chronic pain may also have other symptoms such as:  Depression.  Anger.  Insomnia.  Anxiety. DIAGNOSIS  Your health care provider will help diagnose your condition over time. In many cases, the initial focus will be on excluding possible conditions that could be causing the pain. Depending on your symptoms, your health care provider may order tests to diagnose your condition. Some of these tests may include:   Blood tests.   CT scan.   MRI.   X-rays.   Ultrasounds.   Nerve conduction studies.  You may need to see a specialist.  TREATMENT  Finding treatment that works well may take time. You may be referred to a pain specialist. He or she may prescribe medicine or therapies, such as:   Mindful meditation or yoga.  Shots (injections) of numbing or pain-relieving medicines into the spine or area of pain.  Local electrical stimulation.  Acupuncture.   Massage therapy.   Aroma, color, light, or sound therapy.   Biofeedback.   Working with a physical therapist to keep from getting stiff.   Regular, gentle exercise.   Cognitive or behavioral therapy.   Group support.  Sometimes, surgery may be recommended.  HOME CARE INSTRUCTIONS    Take all medicines as directed by your health care provider.   Lessen stress in your life by relaxing and doing things such as listening to calming music.   Exercise or be active as directed by your health care provider.   Eat a healthy diet and include things such as vegetables, fruits, fish, and lean meats in your diet.   Keep all follow-up appointments with your health care provider.   Attend a support group with others suffering from chronic pain. SEEK MEDICAL CARE IF:   Your pain gets worse.   You develop a new pain that was not there before.   You cannot tolerate medicines given to you by your health care provider.   You have new symptoms since your last visit with your health care provider.  SEEK IMMEDIATE MEDICAL CARE IF:   You feel weak.   You have decreased sensation or numbness.   You lose control of bowel or bladder function.   Your pain suddenly gets much worse.   You develop shaking.  You develop chills.  You develop confusion.  You develop chest pain.  You develop shortness of breath.  MAKE SURE YOU:  Understand these instructions.  Will watch your condition.  Will get help right away if you are not doing well or get worse. Document Released: 08/31/2002 Document Revised: 08/11/2013 Document Reviewed: 06/04/2013 [facility] Patient Information 2014 Eugenio Saenz.   As discussed,  If the pharmacy shorted your last refill of your pain medicine,  You should see  them about getting the remainder.  You may also try the medicine prescribed which can help relieve your pain.

## 2014-04-27 ENCOUNTER — Emergency Department (HOSPITAL_COMMUNITY): Payer: Medicare PPO

## 2014-04-27 ENCOUNTER — Emergency Department (HOSPITAL_COMMUNITY)
Admission: EM | Admit: 2014-04-27 | Discharge: 2014-04-29 | Disposition: A | Discharge status: Other Acute Inpatient Hospital | Payer: Medicare PPO | Attending: Emergency Medicine | Admitting: Emergency Medicine

## 2014-04-27 ENCOUNTER — Encounter (HOSPITAL_COMMUNITY): Payer: Self-pay | Admitting: Emergency Medicine

## 2014-04-27 ENCOUNTER — Telehealth: Payer: Self-pay

## 2014-04-27 DIAGNOSIS — F17200 Nicotine dependence, unspecified, uncomplicated: Secondary | ICD-10-CM | POA: Insufficient documentation

## 2014-04-27 DIAGNOSIS — N390 Urinary tract infection, site not specified: Secondary | ICD-10-CM | POA: Insufficient documentation

## 2014-04-27 DIAGNOSIS — Z8719 Personal history of other diseases of the digestive system: Secondary | ICD-10-CM | POA: Insufficient documentation

## 2014-04-27 DIAGNOSIS — R4182 Altered mental status, unspecified: Secondary | ICD-10-CM | POA: Insufficient documentation

## 2014-04-27 DIAGNOSIS — Z79899 Other long term (current) drug therapy: Secondary | ICD-10-CM | POA: Insufficient documentation

## 2014-04-27 DIAGNOSIS — I10 Essential (primary) hypertension: Secondary | ICD-10-CM | POA: Insufficient documentation

## 2014-04-27 DIAGNOSIS — F22 Delusional disorders: Secondary | ICD-10-CM | POA: Insufficient documentation

## 2014-04-27 DIAGNOSIS — Z8739 Personal history of other diseases of the musculoskeletal system and connective tissue: Secondary | ICD-10-CM | POA: Insufficient documentation

## 2014-04-27 DIAGNOSIS — E079 Disorder of thyroid, unspecified: Secondary | ICD-10-CM | POA: Insufficient documentation

## 2014-04-27 DIAGNOSIS — E876 Hypokalemia: Secondary | ICD-10-CM | POA: Insufficient documentation

## 2014-04-27 DIAGNOSIS — Z791 Long term (current) use of non-steroidal anti-inflammatories (NSAID): Secondary | ICD-10-CM | POA: Insufficient documentation

## 2014-04-27 DIAGNOSIS — Z792 Long term (current) use of antibiotics: Secondary | ICD-10-CM | POA: Insufficient documentation

## 2014-04-27 LAB — URINALYSIS, ROUTINE W REFLEX MICROSCOPIC
BILIRUBIN URINE: NEGATIVE
Glucose, UA: NEGATIVE mg/dL
Hgb urine dipstick: NEGATIVE
Ketones, ur: NEGATIVE mg/dL
Nitrite: NEGATIVE
PH: 7 (ref 5.0–8.0)
SPECIFIC GRAVITY, URINE: 1.015 (ref 1.005–1.030)
Urobilinogen, UA: 1 mg/dL (ref 0.0–1.0)

## 2014-04-27 LAB — COMPREHENSIVE METABOLIC PANEL
ALT: 27 U/L (ref 0–35)
AST: 28 U/L (ref 0–37)
Albumin: 3.7 g/dL (ref 3.5–5.2)
Alkaline Phosphatase: 148 U/L — ABNORMAL HIGH (ref 39–117)
BUN: 10 mg/dL (ref 6–23)
CALCIUM: 9.6 mg/dL (ref 8.4–10.5)
CO2: 29 mEq/L (ref 19–32)
CREATININE: 0.97 mg/dL (ref 0.50–1.10)
Chloride: 101 mEq/L (ref 96–112)
GFR calc Af Amer: 68 mL/min — ABNORMAL LOW (ref >90)
GFR, EST NON AFRICAN AMERICAN: 58 mL/min — AB (ref >90)
Glucose, Bld: 116 mg/dL — ABNORMAL HIGH (ref 70–99)
Potassium: 3.4 mEq/L — ABNORMAL LOW (ref 3.7–5.3)
Sodium: 143 mEq/L (ref 137–147)
TOTAL PROTEIN: 7.5 g/dL (ref 6.0–8.3)
Total Bilirubin: 0.9 mg/dL (ref 0.3–1.2)

## 2014-04-27 LAB — RAPID URINE DRUG SCREEN, HOSP PERFORMED
Amphetamines: NOT DETECTED
Barbiturates: NOT DETECTED
Benzodiazepines: NOT DETECTED
COCAINE: NOT DETECTED
Opiates: NOT DETECTED
Tetrahydrocannabinol: NOT DETECTED

## 2014-04-27 LAB — URINE MICROSCOPIC-ADD ON

## 2014-04-27 LAB — TROPONIN I

## 2014-04-27 LAB — CBC WITH DIFFERENTIAL/PLATELET
BASOS ABS: 0 10*3/uL (ref 0.0–0.1)
Basophils Relative: 0 % (ref 0–1)
EOS ABS: 0 10*3/uL (ref 0.0–0.7)
EOS PCT: 0 % (ref 0–5)
HEMATOCRIT: 39.8 % (ref 36.0–46.0)
Hemoglobin: 13.7 g/dL (ref 12.0–15.0)
Lymphocytes Relative: 23 % (ref 12–46)
Lymphs Abs: 2.3 10*3/uL (ref 0.7–4.0)
MCH: 31.7 pg (ref 26.0–34.0)
MCHC: 34.4 g/dL (ref 30.0–36.0)
MCV: 92.1 fL (ref 78.0–100.0)
MONO ABS: 0.9 10*3/uL (ref 0.1–1.0)
Monocytes Relative: 9 % (ref 3–12)
Neutro Abs: 6.8 10*3/uL (ref 1.7–7.7)
Neutrophils Relative %: 68 % (ref 43–77)
Platelets: 298 10*3/uL (ref 150–400)
RBC: 4.32 MIL/uL (ref 3.87–5.11)
RDW: 14.1 % (ref 11.5–15.5)
WBC: 10.1 10*3/uL (ref 4.0–10.5)

## 2014-04-27 LAB — LIPASE, BLOOD: Lipase: 6 U/L — ABNORMAL LOW (ref 11–59)

## 2014-04-27 LAB — ETHANOL: Alcohol, Ethyl (B): <11 mg/dL (ref 0–11)

## 2014-04-27 LAB — PRO B NATRIURETIC PEPTIDE: PRO B NATRI PEPTIDE: 397.7 pg/mL — AB (ref 0–125)

## 2014-04-27 MED ORDER — CEPHALEXIN 500 MG PO CAPS
500.0000 mg | ORAL_CAPSULE | Freq: Four times a day (QID) | ORAL | Status: DC
  Administered 2014-04-27 – 2014-04-28 (×4): 500 mg via ORAL
  Filled 2014-04-27 (×4): qty 1

## 2014-04-27 MED ORDER — SODIUM CHLORIDE 0.9 % IV SOLN
INTRAVENOUS | Status: DC
  Administered 2014-04-27: 20:00:00 via INTRAVENOUS

## 2014-04-27 MED ORDER — ACETAMINOPHEN 325 MG PO TABS
ORAL_TABLET | ORAL | Status: AC
  Filled 2014-04-27: qty 2

## 2014-04-27 MED ORDER — ACETAMINOPHEN 325 MG PO TABS
650.0000 mg | ORAL_TABLET | Freq: Once | ORAL | Status: AC
  Administered 2014-04-27: 650 mg via ORAL

## 2014-04-27 NOTE — ED Provider Notes (Signed)
CSN: [id number]     Arrival date & time 04/27/14  1757 History  This chart was scribed for Mervin Kung, MD by Zettie Pho, ED Scribe. This patient was seen in room APAH8/APAH8 and the patient's care was started at 8:32 PM.    Chief Complaint  Patient presents with  . Medical Clearance   The history is provided by the patient and the police. The history is limited by the condition of the patient. No language interpreter was used.   Level 5 Caveat- Altered Mental Status (Noncommunicative)   HPI Comments: Rhonda Santos is a 68 y.o. female who presents to the Emergency Department requesting medical clearance. [place] Department found the patient walking around a parking lot near the ED without clothes on and stated that she was "meeting a man to get married today." Patient is prescribed Seroquel, but she stopped taking it because she "doesn't need it." Patient also reports that she has been seeing people at her window at home that she believes to be selling drugs. She denies any pains or suicidal ideations. Patient has an allergy to aspirin. Patient has a history of HTN, hyperlipidemia, hypokalemia with prolonged Q-T interval, diastolic dysfunction, mitral valve prolapse, and thyroid disease.   PCP- Dr. [name] at [place]   Past Medical History  Diagnosis Date  . Hypertension   . Hyperlipidemia   . DDD (degenerative disc disease)     With chronic pain  . Osteoporosis   . Prolonged Q-T interval on ECG 10/07/2013    Secondary to hypokalemia  . Hypokalemia 10/06/2013    Secondary to lasix  . Tobacco abuse 10/07/2013  . Hypoxia 10/07/2013    From mild resp depression from opioids  . Gallstone   . Diastolic dysfunction 96/7893  . Mitral valve prolapse 09/2013    Mild per echo  . Thyroid disease    Past Surgical History  Procedure Laterality Date  . Thyroid surgery     History reviewed. No pertinent family history. History  Substance Use Topics  . Smoking  status: Current Every Day Smoker  . Smokeless tobacco: Not on file  . Alcohol Use: No   OB History   Grav Para Term Preterm Abortions TAB SAB Ect Mult Living                 Review of Systems  Unable to perform ROS: Patient nonverbal      Allergies  Aspirin  Home Medications   Prior to Admission medications   Medication Sig Start Date End Date Taking? Authorizing Provider  albuterol (PROVENTIL HFA;VENTOLIN HFA) 108 (90 BASE) MCG/ACT inhaler Inhale 2 puffs into the lungs every 6 (six) hours as needed for wheezing.    Historical Provider, MD  ALPRAZolam Duanne Moron) 0.5 MG tablet Take 0.5 mg by mouth 2 (two) times daily. For anxiety 10/04/13   Historical Provider, MD  bisacodyl (BISACODYL) 5 MG EC tablet Take 5 mg by mouth daily as needed for moderate constipation.    Historical Provider, MD  cephALEXin (KEFLEX) 500 MG capsule Take 500 mg by mouth 4 (four) times daily.    Historical Provider, MD  furosemide (LASIX) 40 MG tablet Take 40 mg by mouth 2 (two) times daily.    Historical Provider, MD  levothyroxine (SYNTHROID, LEVOTHROID) 175 MCG tablet Take 175 mcg by mouth daily before breakfast.  09/28/13   Historical Provider, MD  meloxicam (MOBIC) 7.5 MG tablet Take 1 tablet (7.5 mg total) by mouth daily. 04/21/14   Almyra Free  Idol, PA-C  metoprolol tartrate (LOPRESSOR) 25 MG tablet Take 25 mg by mouth 2 (two) times daily.  08/24/13   Historical Provider, MD  omeprazole (PRILOSEC) 40 MG capsule Take 40 mg by mouth daily before breakfast.  09/28/13   Historical Provider, MD  oxycodone (ROXICODONE) 30 MG immediate release tablet Take 30-60 mg by mouth every 4 (four) hours as needed for pain.    Historical Provider, MD  potassium chloride (K-DUR) 10 MEQ tablet Take 10 mEq by mouth 3 (three) times daily.    Historical Provider, MD   Triage Vitals: BP 121/55  Pulse 83  Temp(Src) 97.8 F (36.6 C) (Oral)  Resp 20  Ht 5\' 4"  (1.626 m)  Wt 145 lb (65.772 kg)  BMI 24.88 kg/m2  SpO2 98%  Physical Exam   Nursing note and vitals reviewed. Constitutional: She is oriented to person, place, and time. She appears well-developed and well-nourished. No distress.  HENT:  Head: Normocephalic and atraumatic.  Slightly dry mucus membranes.   Eyes: Conjunctivae are normal.  Neck: Normal range of motion. Neck supple.  Cardiovascular: Normal rate, regular rhythm and normal heart sounds.   Pulmonary/Chest: Effort normal and breath sounds normal. No respiratory distress.  Abdominal: Soft. Bowel sounds are normal. She exhibits no distension.  Musculoskeletal: Normal range of motion. She exhibits no edema.  Neurological: She is alert and oriented to person, place, and time.  Skin: Skin is warm and dry.  Psychiatric: She has a normal mood and affect. Her behavior is normal.    ED Course  Procedures (including critical care time)  DIAGNOSTIC STUDIES: Oxygen Saturation is 98% on room air, normal by my interpretation.    COORDINATION OF CARE: 7:07 PM- Ordered a head CT, chest x-ray, CBC, CMP, lipase, BNP, troponin, ethanol, urine drug screen, UA, TSH, and an EKG. Ordered IV fluids.   8:35 PM- Discussed treatment plan with patient at bedside and patient verbalized agreement.   Results for orders placed during the hospital encounter of 04/27/14  CBC WITH DIFFERENTIAL      Result Value Ref Range   WBC 10.1  4.0 - 10.5 K/uL   RBC 4.32  3.87 - 5.11 MIL/uL   Hemoglobin 13.7  12.0 - 15.0 g/dL   HCT 39.8  36.0 - 46.0 %   MCV 92.1  78.0 - 100.0 fL   MCH 31.7  26.0 - 34.0 pg   MCHC 34.4  30.0 - 36.0 g/dL   RDW 14.1  11.5 - 15.5 %   Platelets 298  150 - 400 K/uL   Neutrophils Relative % 68  43 - 77 %   Neutro Abs 6.8  1.7 - 7.7 K/uL   Lymphocytes Relative 23  12 - 46 %   Lymphs Abs 2.3  0.7 - 4.0 K/uL   Monocytes Relative 9  3 - 12 %   Monocytes Absolute 0.9  0.1 - 1.0 K/uL   Eosinophils Relative 0  0 - 5 %   Eosinophils Absolute 0.0  0.0 - 0.7 K/uL   Basophils Relative 0  0 - 1 %   Basophils  Absolute 0.0  0.0 - 0.1 K/uL  COMPREHENSIVE METABOLIC PANEL      Result Value Ref Range   Sodium 143  137 - 147 mEq/L   Potassium 3.4 (*) 3.7 - 5.3 mEq/L   Chloride 101  96 - 112 mEq/L   CO2 29  19 - 32 mEq/L   Glucose, Bld 116 (*) 70 - 99 mg/dL  BUN 10  6 - 23 mg/dL   Creatinine, Ser 0.97  0.50 - 1.10 mg/dL   Calcium 9.6  8.4 - 10.5 mg/dL   Total Protein 7.5  6.0 - 8.3 g/dL   Albumin 3.7  3.5 - 5.2 g/dL   AST 28  0 - 37 U/L   ALT 27  0 - 35 U/L   Alkaline Phosphatase 148 (*) 39 - 117 U/L   Total Bilirubin 0.9  0.3 - 1.2 mg/dL   GFR calc non Af Amer 58 (*) >90 mL/min   GFR calc Af Amer 68 (*) >90 mL/min  LIPASE, BLOOD      Result Value Ref Range   Lipase 6 (*) 11 - 59 U/L  PRO B NATRIURETIC PEPTIDE      Result Value Ref Range   Pro B Natriuretic peptide (BNP) 397.7 (*) 0 - 125 pg/mL  TROPONIN I      Result Value Ref Range   Troponin I <0.30  <0.30 ng/mL  ETHANOL      Result Value Ref Range   Alcohol, Ethyl (B) <11  0 - 11 mg/dL  URINE RAPID DRUG SCREEN (HOSP PERFORMED)      Result Value Ref Range   Opiates NONE DETECTED  NONE DETECTED   Cocaine NONE DETECTED  NONE DETECTED   Benzodiazepines NONE DETECTED  NONE DETECTED   Amphetamines NONE DETECTED  NONE DETECTED   Tetrahydrocannabinol NONE DETECTED  NONE DETECTED   Barbiturates NONE DETECTED  NONE DETECTED  URINALYSIS, ROUTINE W REFLEX MICROSCOPIC      Result Value Ref Range   Color, Urine YELLOW  YELLOW   APPearance CLEAR  CLEAR   Specific Gravity, Urine 1.015  1.005 - 1.030   pH 7.0  5.0 - 8.0   Glucose, UA NEGATIVE  NEGATIVE mg/dL   Hgb urine dipstick NEGATIVE  NEGATIVE   Bilirubin Urine NEGATIVE  NEGATIVE   Ketones, ur NEGATIVE  NEGATIVE mg/dL   Protein, ur TRACE (*) NEGATIVE mg/dL   Urobilinogen, UA 1.0  0.0 - 1.0 mg/dL   Nitrite NEGATIVE  NEGATIVE   Leukocytes, UA SMALL (*) NEGATIVE  URINE MICROSCOPIC-ADD ON      Result Value Ref Range   Squamous Epithelial / LPF FEW (*) RARE   WBC, UA 7-10  <3 WBC/hpf    Bacteria, UA FEW (*) RARE   Urine-Other RARE YEAST     Dg Chest 2 View  04/27/2014   CLINICAL DATA:  Altered mental status, hypertension, smoker, medical clearance, history mitral valve prolapse  EXAM: CHEST  2 VIEW  COMPARISON:  01/19/2014  FINDINGS: Borderline enlargement of cardiac silhouette.  Mediastinal contours and pulmonary vascularity normal.  Lungs clear.  No pleural effusion or pneumothorax.  No acute osseous findings.  IMPRESSION: No acute abnormalities.   Electronically Signed   By: Lavonia Dana M.D.   On: 04/27/2014 20:17   Ct Head Wo Contrast  04/27/2014   CLINICAL DATA:  Altered mental status, medical clearance  EXAM: CT HEAD WITHOUT CONTRAST  TECHNIQUE: Contiguous axial images were obtained from the base of the skull through the vertex without intravenous contrast.  COMPARISON:  CT HEAD W/O CM dated 01/19/2014; MR HEAD W/O CM dated 11/05/2013  FINDINGS: Mild age-appropriate atrophy. No abnormal attenuation to suggest hemorrhage, extra-axial fluid, or infarct. No hydrocephalus or evidence of at. Calvarium is intact. No significant inflammatory change visualized in the paranasal sinuses.  IMPRESSION: Head CT appearance normal for age   Electronically Signed   By:  Skipper Cliche M.D.   On: 04/27/2014 20:07        EKG Interpretation   Date/Time:  Wednesday Apr 27 2014 19:23:41 EDT Ventricular Rate:  75 PR Interval:  206 QRS Duration: 138 QT Interval:  462 QTC Calculation: 515 R Axis:   -55 Text Interpretation:  Normal sinus rhythm Left axis deviation Left bundle  branch block Abnormal ECG since last tracing no significant change  Confirmed by Eulis Foster  MD, Vira Agar [id number]) on 04/27/2014 7:41:16 PM Also  confirmed by Rogene Houston  MD, [place] [id number])  on 04/27/2014 8:31:47 PM      MDM   Final diagnoses:  Delusions  UTI (lower urinary tract infection)    Workup without significant medical findings. Suspect this may be psychiatric. Patient has been seen for hallucinations in the  past. TSH is still pending. Head CT negative EKG without acute findings. Labs all without significant abnormalities except for questionable urinary tract infection will treat patient with Keflex for that. Urine drug screen negative alcohol negative troponin negative. No significant electrolyte abnormalities no significant leukocytosis or anemia. Will consult behavioral health.  I personally performed the services described in this documentation, which was scribed in my presence. The recorded information has been reviewed and is accurate.      Mervin Kung, MD 04/27/14 [phone]

## 2014-04-27 NOTE — ED Notes (Signed)
Pt denies knowing why she is being seen her in the ER today. Pt has flight of ideas and is unable to fully describe what happened today to get her here. Pt states she has been living in a hotel with her soon because of a drug bust that occurred across the street from where she lives.  Pt states she had to throw her clothes away this am due to drug residue being present. Pt states she was not naked today but officer states she was picked up earlier this am naked and once again at the doctors office. Unknown whether she was naked at the MD office. Pt states there is nothing wrong with her and she does not know why the officers picked her up. Attempted to contact pt son but no answer on number pt gave. Pt in hallway bed at this time with no complaints.

## 2014-04-27 NOTE — Telephone Encounter (Signed)
Patient walked in the waiting room today

## 2014-04-27 NOTE — Telephone Encounter (Signed)
This patient came into our office today carrying her dog. She related that she stated that "they "were out to get her. She states that she was told to come here in high. She states that her son was dead. She related that there was a Engineer, structural who was supposed to help her. When I talked with her she stated that the police officer was from far away. She also states that she was fearful they would find her. So at that point we talked with the patient for a few more minutes it was very quick to see that she was oriented to herself but was not oriented to where she was. She was not a patient of our practice. We were able to find information about her in the electronic records. The phone number to her son's contact was an operable. 911 was called. Please responded. They talked with the patient and escorted her to the hospital. The ER doctor was spoken with regarding this situation. I did not feel that this patient was capable of taking care of herself and was potentially a harm to herself.

## 2014-04-27 NOTE — ED Notes (Signed)
Pt brought ED by [employer] after a witnessed called police due to pt being confused. Witness states pt was walking around parking lot without clothes on and talking confused. Police found pt in parking lot near ED. Pt told police she was meeting a man to get married today. Pt is rx Seroquel but states she is no longer taking the medication because she "doesn't need it". Pt states "I've been seeing people at my window and I think they are selling drugs".

## 2014-04-28 LAB — TSH: TSH: 2.25 u[IU]/mL (ref 0.350–4.500)

## 2014-04-28 MED ORDER — IBUPROFEN 800 MG PO TABS
800.0000 mg | ORAL_TABLET | Freq: Once | ORAL | Status: AC
  Administered 2014-04-28: 800 mg via ORAL

## 2014-04-28 MED ORDER — METOPROLOL TARTRATE 25 MG PO TABS
25.0000 mg | ORAL_TABLET | Freq: Two times a day (BID) | ORAL | Status: DC
  Administered 2014-04-28 (×2): 25 mg via ORAL
  Filled 2014-04-28 (×2): qty 1

## 2014-04-28 MED ORDER — IBUPROFEN 800 MG PO TABS
ORAL_TABLET | ORAL | Status: AC
  Filled 2014-04-28: qty 1

## 2014-04-28 MED ORDER — MELOXICAM 7.5 MG PO TABS
7.5000 mg | ORAL_TABLET | Freq: Every day | ORAL | Status: DC
  Administered 2014-04-28: 7.5 mg via ORAL
  Filled 2014-04-28 (×4): qty 1

## 2014-04-28 MED ORDER — ACETAMINOPHEN 325 MG PO TABS
650.0000 mg | ORAL_TABLET | ORAL | Status: DC | PRN
Start: 2014-04-28 — End: 2014-04-29
  Administered 2014-04-28 (×2): 650 mg via ORAL
  Filled 2014-04-28 (×2): qty 2

## 2014-04-28 MED ORDER — LEVOTHYROXINE SODIUM 175 MCG PO TABS: 175.0000 ug | ORAL_TABLET | Freq: Every day | ORAL | Status: DC

## 2014-04-28 MED ORDER — LEVOTHYROXINE SODIUM 175 MCG PO TABS
175.0000 ug | ORAL_TABLET | Freq: Every day | ORAL | Status: DC
  Filled 2014-04-28 (×3): qty 1

## 2014-04-28 MED ORDER — LEVOTHYROXINE SODIUM 100 MCG PO TABS
100.0000 ug | ORAL_TABLET | Freq: Every day | ORAL | Status: DC
  Administered 2014-04-28: 100 ug via ORAL
  Filled 2014-04-28 (×3): qty 1

## 2014-04-28 MED ORDER — POTASSIUM CHLORIDE ER 10 MEQ PO TBCR
10.0000 meq | EXTENDED_RELEASE_TABLET | Freq: Three times a day (TID) | ORAL | Status: DC
  Administered 2014-04-28 (×3): 10 meq via ORAL
  Filled 2014-04-28 (×10): qty 1

## 2014-04-28 MED ORDER — ZOLPIDEM TARTRATE 5 MG PO TABS: 5.0000 mg | ORAL_TABLET | Freq: Every evening | ORAL | Status: DC | PRN

## 2014-04-28 MED ORDER — FUROSEMIDE 40 MG PO TABS
40.0000 mg | ORAL_TABLET | Freq: Two times a day (BID) | ORAL | Status: DC
  Administered 2014-04-28 (×2): 40 mg via ORAL
  Filled 2014-04-28 (×2): qty 1

## 2014-04-28 MED ORDER — ALBUTEROL SULFATE (2.5 MG/3ML) 0.083% IN NEBU: 2.5000 mg | INHALATION_SOLUTION | Freq: Four times a day (QID) | RESPIRATORY_TRACT | Status: DC | PRN

## 2014-04-28 MED ORDER — ONDANSETRON HCL 4 MG PO TABS: 4.0000 mg | ORAL_TABLET | Freq: Three times a day (TID) | ORAL | Status: DC | PRN

## 2014-04-28 MED ORDER — ALPRAZOLAM 0.5 MG PO TABS
0.5000 mg | ORAL_TABLET | Freq: Two times a day (BID) | ORAL | Status: DC
  Administered 2014-04-28 (×2): 0.5 mg via ORAL
  Filled 2014-04-28 (×2): qty 1

## 2014-04-28 MED ORDER — LORAZEPAM 1 MG PO TABS: 1.0000 mg | ORAL_TABLET | Freq: Three times a day (TID) | ORAL | Status: DC | PRN

## 2014-04-28 MED ORDER — IBUPROFEN 400 MG PO TABS
600.0000 mg | ORAL_TABLET | Freq: Three times a day (TID) | ORAL | Status: DC | PRN
  Administered 2014-04-28: 600 mg via ORAL
  Filled 2014-04-28: qty 2

## 2014-04-28 MED ORDER — ALUM & MAG HYDROXIDE-SIMETH 200-200-20 MG/5ML PO SUSP: 30.0000 mL | ORAL | Status: DC | PRN

## 2014-04-28 MED ORDER — NICOTINE 14 MG/24HR TD PT24
14.0000 mg | MEDICATED_PATCH | Freq: Once | TRANSDERMAL | Status: DC
  Administered 2014-04-28: 14 mg via TRANSDERMAL
  Filled 2014-04-28: qty 1

## 2014-04-28 MED ORDER — ALBUTEROL SULFATE HFA 108 (90 BASE) MCG/ACT IN AERS: 2.0000 | INHALATION_SPRAY | Freq: Four times a day (QID) | RESPIRATORY_TRACT | Status: DC | PRN

## 2014-04-28 MED ORDER — PANTOPRAZOLE SODIUM 40 MG PO TBEC
40.0000 mg | DELAYED_RELEASE_TABLET | Freq: Every day | ORAL | Status: DC
  Administered 2014-04-28: 40 mg via ORAL
  Filled 2014-04-28: qty 1

## 2014-04-28 MED ORDER — LEVOTHYROXINE SODIUM 75 MCG PO TABS
75.0000 ug | ORAL_TABLET | Freq: Every day | ORAL | Status: DC
  Administered 2014-04-28: 75 ug via ORAL
  Filled 2014-04-28 (×3): qty 1

## 2014-04-28 NOTE — ED Notes (Signed)
Waiting on [facility] for transport to [facility].

## 2014-04-28 NOTE — ED Notes (Signed)
Pt informed waiting on papers & then transfer.

## 2014-04-28 NOTE — BH Assessment (Signed)
[facility] Assessment Progress Note  Petition, QPE and EKG have been faxed to [facility].  Anderson Malta confirms receipt.  Meredith at [facility] has been asked to fax Findings and Custody Order directly to [facility] at [phone].  I followed up with Ron after shift change.  He agrees to see that the Findings and Custody order is faxed to [facility] after the [facility] receives it.  He will follow up by calling Anderson Malta at [phone].  Anderson Malta agrees to provide bed assignment after receiving Findings and Custody order.  Jalene Mullet, MA Triage Specialist 04/28/2014 @ 19:28

## 2014-04-28 NOTE — ED Notes (Signed)
Attempted to call report. Was advised nurse receiving pt will call this nurse back for report. 

## 2014-04-28 NOTE — ED Notes (Signed)
Spoke w/  [name] from [facility]. Advised Rhonda Santos is still waiting on Findings & Custody papers to be faxed. Have not been served yet. Call [facility] @ [phone] for updates. Fax papers to [phone] when they have been served.

## 2014-04-28 NOTE — BH Assessment (Signed)
No appropriate bed available for this patient. She will need a 400 hall bed here at [facility]. Pending placement at another facility.

## 2014-04-28 NOTE — BH Assessment (Signed)
Tele Assessment Note   Rhonda Santos is an 68 y.o. female that presents to [facility] escorted by Police. Per ED notes, [employer] found the patient walking around a parking lot near the ED without clothes on and stated that she was "meeting a man to get married today." Patient is prescribed Seroquel, but she stopped taking it because she "doesn't need it." Patient also reports that she has been seeing people at her window at home that she believes to be selling drugs. Witness's also state that pt was walking around parking lot without clothes on and talking confused.    Writer met with patient to complete a tele assessment. Pt denies knowing why she is being seen in ED today or what circumstances lead to today's visit in the ED. She appears to have flight of ideas. She tells this Probation officer that she lives in a house with her son. However, per ED notes she informed staff that she lives in a hotel with her son. When asked about living in the hotel she explains that because of a drug bust that occurred across the street she moved to a hotel. Pt states she had to throw her clothes away this am due to drug residue being present. Sts that she didn't want to be involved in such drug activity. Writer asked patient is she recalls not having any clothing on when being found. Patient denies stating that was untrue. Pt did however tell this Probation officer and police that she was meeting a man to get married. Patient continued to ramble as she seemed to become increasingly confused and then frustrated. Patient then ended the conversation stating, "Please leave me alone I don't want to answer anymore of your questions". Writer ended the tele assessment.   Prior to ending the tele Gaffer did ask patient about SI, HI, and AVH's. She denied all. She denied issues with depression or anxiety. She denied prior mental health treatment inpatient/outpatient. She does not have a outpatient provider stating, "I don't  need one".   Writer contacted patient's son Kaylyn Lim [phone] for collateral information but the # listed was not viable.     Axis I: Psychotic Disorder NOS Axis II: Deferred Axis III:  Past Medical History  Diagnosis Date  . Hypertension   . Hyperlipidemia   . DDD (degenerative disc disease)     With chronic pain  . Osteoporosis   . Prolonged Q-T interval on ECG 10/07/2013    Secondary to hypokalemia  . Hypokalemia 10/06/2013    Secondary to lasix  . Tobacco abuse 10/07/2013  . Hypoxia 10/07/2013    From mild resp depression from opioids  . Gallstone   . Diastolic dysfunction 33/8250  . Mitral valve prolapse 09/2013    Mild per echo  . Thyroid disease    Axis IV: other psychosocial or environmental problems, problems related to social environment and problems with access to health care services Axis V: 31-40 impairment in reality testing  Past Medical History:  Past Medical History  Diagnosis Date  . Hypertension   . Hyperlipidemia   . DDD (degenerative disc disease)     With chronic pain  . Osteoporosis   . Prolonged Q-T interval on ECG 10/07/2013    Secondary to hypokalemia  . Hypokalemia 10/06/2013    Secondary to lasix  . Tobacco abuse 10/07/2013  . Hypoxia 10/07/2013    From mild resp depression from opioids  . Gallstone   . Diastolic dysfunction 53/9767  . Mitral  valve prolapse 09/2013    Mild per echo  . Thyroid disease     Past Surgical History  Procedure Laterality Date  . Thyroid surgery      Family History: History reviewed. No pertinent family history.  Social History:  reports that she has been smoking.  She does not have any smokeless tobacco history on file. She reports that she does not drink alcohol or use illicit drugs.  Additional Social History:  Alcohol / Drug Use Pain Medications: SEE MAR Prescriptions: SEE MAR Over the Counter: SEE MAR History of alcohol / drug use?: No history of alcohol / drug abuse  CIWA:  CIWA-Ar BP: 153/53 mmHg Pulse Rate: 71 COWS:    Allergies:  Allergies  Allergen Reactions  . Aspirin Other (See Comments)    unknown    Home Medications:  (Not in a hospital admission)  OB/GYN Status:  No LMP recorded. Patient is postmenopausal.  General Assessment Data Location of Assessment: [facility] Is this a Tele or Face-to-Face Assessment?: Tele Assessment Is this an Initial Assessment or a Re-assessment for this encounter?: Initial Assessment Living Arrangements: Other (Comment) (son) Can pt return to current living arrangement?: Yes Admission Status: Voluntary Is patient capable of signing voluntary admission?: Yes Transfer from: [facility] Referral Source: Self/Family/Friend     [place] Living Arrangements: Other (Comment) (son) Name of Psychiatrist:  (Pt denies ) Name of Therapist:  (Pt denies )  Education Status Is patient currently in school?: No  Risk to self Suicidal Ideation: No Suicidal Intent: No Is patient at risk for suicide?: No Suicidal Plan?: No Access to Means: No What has been your use of drugs/alcohol within the last 12 months?:  (patient denies ) Previous Attempts/Gestures: No How many times?:  (n/a) Other Self Harm Risks:  (none reported ) Triggers for Past Attempts: Other (Comment) (pt denies previous attempts or gestures ) Intentional Self Injurious Behavior: None Family Suicide History: Yes (Grandmother had a questionable mental illness ) Recent stressful life event(s):  (patient denies ) Persecutory voices/beliefs?: No Depression: No Depression Symptoms:  (patient denies ) Substance abuse history and/or treatment for substance abuse?: No Suicide prevention information given to non-admitted patients: Not applicable  Risk to Others Homicidal Ideation: No Thoughts of Harm to Others: No Current Homicidal Intent: No Current Homicidal Plan: No Access to Homicidal Means: No Identified Victim:  (n/a) History of harm  to others?: No Assessment of Violence: None Noted Violent Behavior Description:  (patient is calm and cooperative ) Does patient have access to weapons?: No Criminal Charges Pending?: No Does patient have a court date: No  Psychosis Hallucinations:  (pt denies ) Delusions: Unspecified;Grandiose (pt denies;appears delusional)  Mental Status Report Appear/Hygiene: Disheveled Eye Contact: Fair (Initially ok; towards end placed sheets over face ) Motor Activity: Freedom of movement Speech: Logical/coherent Level of Consciousness: Alert Mood: Depressed Affect: Appropriate to circumstance Anxiety Level: Moderate Thought Processes: Coherent;Relevant Judgement: Impaired Orientation: Person;Situation;Time;Place Obsessive Compulsive Thoughts/Behaviors: None  Cognitive Functioning Concentration: Decreased Memory: Recent Intact;Remote Intact IQ: Average Insight: Good Impulse Control: Fair Appetite: Poor Weight Loss:  (pt reports wt. loss but unaware of how much) Weight Gain:  (none reported ) Sleep: Decreased Total Hours of Sleep:  (Pt aware and admits to poor sleep ) Vegetative Symptoms: None  ADLScreening [facility] Assessment Services) Patient's cognitive ability adequate to safely complete daily activities?: Yes Patient able to express need for assistance with ADLs?: Yes Independently performs ADLs?: No  Prior Inpatient Therapy Prior Inpatient Therapy:  No Prior Therapy Dates:  (n/a) Prior Therapy Facilty/Provider(s):  (pt denies ) Reason for Treatment:  (n/a)  Prior Outpatient Therapy Prior Outpatient Therapy: No Prior Therapy Dates: n/a Prior Therapy Facilty/Provider(s):  (n/a) Reason for Treatment:  (n/a)  ADL Screening (condition at time of admission) Patient's cognitive ability adequate to safely complete daily activities?: Yes Is the patient deaf or have difficulty hearing?: No Does the patient have difficulty seeing, even when wearing glasses/contacts?: No Does the  patient have difficulty concentrating, remembering, or making decisions?: Yes Patient able to express need for assistance with ADLs?: Yes Does the patient have difficulty dressing or bathing?: No Independently performs ADLs?: No Communication: Independent Dressing (OT): Independent Grooming: Independent Feeding: Independent Bathing: Independent Toileting: Independent In/Out Bed: Independent Walks in Home: Independent Does the patient have difficulty walking or climbing stairs?: No Weakness of Legs: None Weakness of Arms/Hands: None  Home Assistive Devices/Equipment Home Assistive Devices/Equipment: None    Abuse/Neglect Assessment (Assessment to be complete while patient is alone) Physical Abuse: Denies Verbal Abuse: Denies Sexual Abuse: Denies Exploitation of patient/patient's resources: Denies Self-Neglect: Denies Values / Beliefs Cultural Requests During Hospitalization: None Spiritual Requests During Hospitalization: None   Advance Directives (For Healthcare) Advance Directive: Patient does not have advance directive Nutrition Screen- [place] Adult/[facility] Patient's home diet: Regular  Additional Information 1:1 In Past 12 Months?: No CIRT Risk: No Elopement Risk: No Does patient have medical clearance?: Yes     Disposition:  Disposition Initial Assessment Completed for this Encounter: Yes  Evangeline Gula 04/28/2014 8:26 AM

## 2014-04-28 NOTE — ED Notes (Signed)
Pt left department w/ [facility].

## 2014-04-28 NOTE — ED Notes (Signed)
[name] from [facility] requesting patient's commitment paperwork.

## 2014-04-28 NOTE — BH Assessment (Signed)
TA scheduled for this patient at 0730. Writer contacted EDP-Dr. Lacinda Axon for collateral information prior to seeing this patient.

## 2014-04-28 NOTE — ED Notes (Signed)
Pt given 650mg  of tylenol for reported pain in arms and legs.

## 2014-04-28 NOTE — BH Assessment (Signed)
[facility] Assessment Progress Note  At 13:03 I received a call from [facility] at [facility].  They have tentatively agreed to accept this pt to their facility with several caveats:  *They need a copy of pt's EKG *They need a copy of pt's IVC paperwork *The bed is not ready at this time; they will call when it opens  [facility] staff is aware that [facility] needs IVC paperwork, and will fax it to [facility] as soon as it is ready.  Jalene Mullet, MA Triage Specialist 04/28/2014 @ 13:14

## 2014-04-28 NOTE — ED Notes (Signed)
Pt given breakfast tray. Tele-psych machine placed in room for 0730 appointment.

## 2014-04-28 NOTE — ED Notes (Signed)
Pt accepted at [facility] room 400A. Call report to [phone].

## 2014-04-28 NOTE — ED Notes (Signed)
[name] callled from [facility] and said pt has been accepted at [facility]. Requires pt to have IVC.

## 2014-06-02 ENCOUNTER — Ambulatory Visit (INDEPENDENT_AMBULATORY_CARE_PROVIDER_SITE_OTHER): Payer: Medicare PPO | Admitting: Psychology

## 2014-06-02 ENCOUNTER — Encounter (HOSPITAL_COMMUNITY): Payer: Self-pay | Admitting: Psychology

## 2014-06-02 DIAGNOSIS — F489 Nonpsychotic mental disorder, unspecified: Secondary | ICD-10-CM

## 2014-06-02 DIAGNOSIS — F411 Generalized anxiety disorder: Secondary | ICD-10-CM

## 2014-06-02 DIAGNOSIS — F5105 Insomnia due to other mental disorder: Secondary | ICD-10-CM

## 2014-06-02 NOTE — Progress Notes (Signed)
PROGRESS NOTE  Patient:   Rhonda Santos   DOB:   [birth year]  MR Number:  [id number]  Location:  [facility] [address] [place] [address] Dept: [phone]           Date of Service:   06/02/2014   Start Time:   2 PM End Time:   3 PM  Provider/Observer:  Edgardo Roys PSYD       Billing Code/Service: [id number]  Chief Complaint:     Chief Complaint  Patient presents with  . Anxiety  . Stress  . Hallucinations  . Other    sleep depiviation    Reason for Service:  The patient is a 68 year old Caucasian female reports that she has been hearing voices and seeing people at night most of time but sometimes during the day. The patient reports that she first noticed difficulties after her mother died 2 years ago. She reports that she always dealt with anxiety and worry and began getting worse after her husband died 3 years prior. However, after her mother died she was alone most the time and she began to sleep very poorly. Her anxiety increased and eventually she began "seeing people" through the window or in her house that were not there. She described him as "zombie people." The patient reports this would almost always happen at night or late in the afternoon she was very drowsy. The patient reports that she has had significant sleep difficulties. She also was sent to an inpatient facility in May of this year due to these problems. She has been treated with Prozac and risperidone. She reports this medication was started after she told her physician about her hallucinations. She reports that it helped a lot but that she was excessively sleepy.  Current Status:  The patient describes significant symptoms of anxiety and depression and excessive worrying. She has been feeling the death of her husband and her mother. The patient denies any changes in expressive or receptive language difficulties, no  geographic disorientation, and no significant memory problems other than some minor short-term memory problems. However, she reports significant anxiety and worry of significant sleep disturbance along with what appear to be hypnagogic experiences that she has been very frightened by particularly the fact that she is alone at night.   Reliability of Information:  the information was provided by the patient as well as review of available medical records.  Behavioral Observation: [name]  presents as a 68 y.o.-year-old Right Caucasian Female who appeared her stated age. her dress was Appropriate and she was Well Groomed and her manners were Appropriate to the situation.  There were not any physical disabilities noted.  she displayed an appropriate level of cooperation and motivation.    Interactions:    Active   Attention:   within normal limits  Memory:   within normal limits  Visuo-spatial:   within normal limits  Speech (Volume):  normal  Speech:   normal pitch  Thought Process:  Coherent  Though Content:  WNL  Orientation:   person, place, time/date and situation  Judgment:   Good  Planning:   Good  Affect:    Anxious  Mood:    Anxious  Insight:   Fair  Intelligence:   normal  Marital Status/Living:  the patient was born and raised in [place]. She is widowed and her husband died 3 years ago. She has 3 children who  are all adults and living on her own. [name] mother died 2 years ago and after her mother and husband died she began feeling more and more anxious.  Current Employment:  the patient is not working and is retired.  Past Employment:    Substance Use:  No concerns of substance abuse are reported.    Education:   The patient completed the 10th grade and has no formal education beyond that.  Medical History:   Past Medical History  Diagnosis Date  . Hypertension   . Hyperlipidemia   . DDD (degenerative disc disease)     With chronic  pain  . Osteoporosis   . Prolonged Q-T interval on ECG 10/07/2013    Secondary to hypokalemia  . Hypokalemia 10/06/2013    Secondary to lasix  . Tobacco abuse 10/07/2013  . Hypoxia 10/07/2013    From mild resp depression from opioids  . Gallstone   . Diastolic dysfunction 93/7169  . Mitral valve prolapse 09/2013    Mild per echo  . Thyroid disease         Outpatient Encounter Prescriptions as of 06/02/2014  Medication Sig  . albuterol (PROVENTIL HFA;VENTOLIN HFA) 108 (90 BASE) MCG/ACT inhaler Inhale 2 puffs into the lungs every 6 (six) hours as needed for wheezing.  Marland Kitchen ALPRAZolam (XANAX) 0.5 MG tablet Take 0.5 mg by mouth 2 (two) times daily as needed for anxiety.   . bisacodyl (BISACODYL) 5 MG EC tablet Take 5 mg by mouth daily as needed for moderate constipation.  . docusate sodium (COLACE) 100 MG capsule Take 100 mg by mouth daily as needed for mild constipation.  . furosemide (LASIX) 40 MG tablet Take 40 mg by mouth 2 (two) times daily as needed for fluid.   Marland Kitchen levothyroxine (SYNTHROID, LEVOTHROID) 175 MCG tablet Take 175 mcg by mouth daily before breakfast.   . meloxicam (MOBIC) 7.5 MG tablet Take 1 tablet (7.5 mg total) by mouth daily.  . metoprolol tartrate (LOPRESSOR) 25 MG tablet Take 25 mg by mouth daily.   Marland Kitchen omeprazole (PRILOSEC) 40 MG capsule Take 40 mg by mouth daily before breakfast.   . oxycodone (ROXICODONE) 30 MG immediate release tablet Take 30-60 mg by mouth every 4 (four) hours as needed for pain.  . potassium chloride (K-DUR) 10 MEQ tablet Take 10 mEq by mouth 3 (three) times daily.          Sexual History:   History  Sexual Activity  . Sexual Activity: Not on file    Abuse/Trauma History:  the patient denies any history of abuse or trauma although her husband died 3 years ago when her mother died 2 years ago both of which have been very stressful and traumatic to her.  Psychiatric History:   the patient has life history of anxiety but it really began to  worsen over the past couple of years culminated in severe sleep deprivation and what appear to be hypnagogic experiences.  Family Med/Psych History: No family history on file.  Risk of Suicide/Violence: virtually non-existent  the patient denies any suicidal or homicidal ideation  Impression/DX:   the patient has a history of anxiety that is beginning to worsen as her husband and mother died. She began sleeping poorly and when she does sleep is very poor quality. The patient started having what would probably be best described as hypnagogic experiences that were interpreted as ghosts or zombies that great he frightened her when they happen. While she was able to clearly understand  later and after the emotional response that these were not real it still caused her great deal of distress. The sclerae experiences happen in at her house began to become classically condition with the house itself he began to be afraid of being in her home by herself and began to be afraid of going to sleep because the events and symptoms would recur.  Disposition/Plan:   we will set the patient up for a psychotherapeutic interventions build coping skills chief for her from sleep pattern as well as working on the underlying anxiety symptoms. I've also set the patient up for appointment with Dr. Harrington Challenger for psychiatric consultation to keep up with the medications.  Diagnosis:    Axis I:  Generalized anxiety disorder  Insomnia due to mental disorder      Axis II: No diagnosis         Amori Colomb R, PsyD 06/02/2014

## 2014-06-30 ENCOUNTER — Ambulatory Visit (INDEPENDENT_AMBULATORY_CARE_PROVIDER_SITE_OTHER): Payer: Medicare PPO | Admitting: Psychiatry

## 2014-06-30 ENCOUNTER — Encounter (HOSPITAL_COMMUNITY): Payer: Self-pay | Admitting: Psychiatry

## 2014-06-30 VITALS — BP 135/58 | Ht 64.0 in | Wt 156.0 lb

## 2014-06-30 DIAGNOSIS — F29 Unspecified psychosis not due to a substance or known physiological condition: Secondary | ICD-10-CM

## 2014-06-30 MED ORDER — RISPERIDONE 0.5 MG PO TABS: ORAL_TABLET | ORAL | Status: DC

## 2014-06-30 MED ORDER — ALPRAZOLAM 0.5 MG PO TABS: 0.5000 mg | ORAL_TABLET | Freq: Three times a day (TID) | ORAL | Status: DC
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
## 2014-07-06 ENCOUNTER — Ambulatory Visit (HOSPITAL_COMMUNITY): Payer: Self-pay | Admitting: Psychology

## 2014-07-21 ENCOUNTER — Ambulatory Visit (HOSPITAL_COMMUNITY): Payer: Self-pay | Admitting: Psychology

## 2014-07-27 ENCOUNTER — Ambulatory Visit (HOSPITAL_COMMUNITY): Payer: Medicare PPO | Admitting: Psychiatry

## 2014-08-12 ENCOUNTER — Encounter (HOSPITAL_COMMUNITY): Payer: Self-pay | Admitting: Psychology

## 2014-08-12 ENCOUNTER — Ambulatory Visit (HOSPITAL_COMMUNITY): Payer: Self-pay | Admitting: Psychology

## 2014-12-23 DIAGNOSIS — I2699 Other pulmonary embolism without acute cor pulmonale: Secondary | ICD-10-CM

## 2014-12-23 HISTORY — DX: Other pulmonary embolism without acute cor pulmonale: I26.99

## 2015-06-08 DIAGNOSIS — M79606 Pain in leg, unspecified: Secondary | ICD-10-CM | POA: Diagnosis not present

## 2015-06-08 DIAGNOSIS — M1990 Unspecified osteoarthritis, unspecified site: Secondary | ICD-10-CM | POA: Diagnosis not present

## 2015-06-08 DIAGNOSIS — M797 Fibromyalgia: Secondary | ICD-10-CM | POA: Diagnosis not present

## 2015-06-08 DIAGNOSIS — M5416 Radiculopathy, lumbar region: Secondary | ICD-10-CM | POA: Diagnosis not present

## 2015-06-19 ENCOUNTER — Ambulatory Visit (HOSPITAL_COMMUNITY)
Admission: RE | Admit: 2015-06-19 | Discharge: 2015-06-19 | Disposition: A | Discharge status: Home / Self Care | Payer: Medicare PPO | Source: Ambulatory Visit | Attending: Neurology | Admitting: Neurology

## 2015-06-19 ENCOUNTER — Other Ambulatory Visit: Payer: Self-pay | Admitting: Neurology

## 2015-06-19 DIAGNOSIS — M479 Spondylosis, unspecified: Secondary | ICD-10-CM | POA: Diagnosis not present

## 2015-06-19 DIAGNOSIS — M5416 Radiculopathy, lumbar region: Secondary | ICD-10-CM | POA: Diagnosis not present

## 2015-06-19 DIAGNOSIS — M545 Low back pain: Secondary | ICD-10-CM | POA: Diagnosis present

## 2015-06-19 DIAGNOSIS — M5417 Radiculopathy, lumbosacral region: Secondary | ICD-10-CM

## 2015-06-19 DIAGNOSIS — M5136 Other intervertebral disc degeneration, lumbar region: Secondary | ICD-10-CM | POA: Diagnosis not present

## 2015-06-22 ENCOUNTER — Other Ambulatory Visit (HOSPITAL_COMMUNITY): Payer: Self-pay | Admitting: Physician Assistant

## 2015-06-22 DIAGNOSIS — E6609 Other obesity due to excess calories: Secondary | ICD-10-CM | POA: Diagnosis not present

## 2015-06-22 DIAGNOSIS — E782 Mixed hyperlipidemia: Secondary | ICD-10-CM | POA: Diagnosis not present

## 2015-06-22 DIAGNOSIS — Z23 Encounter for immunization: Secondary | ICD-10-CM | POA: Diagnosis not present

## 2015-06-22 DIAGNOSIS — E039 Hypothyroidism, unspecified: Secondary | ICD-10-CM | POA: Diagnosis not present

## 2015-06-22 DIAGNOSIS — Z1389 Encounter for screening for other disorder: Secondary | ICD-10-CM | POA: Diagnosis not present

## 2015-06-22 DIAGNOSIS — Z6832 Body mass index (BMI) 32.0-32.9, adult: Secondary | ICD-10-CM | POA: Diagnosis not present

## 2015-06-22 DIAGNOSIS — E063 Autoimmune thyroiditis: Secondary | ICD-10-CM | POA: Diagnosis not present

## 2015-06-22 DIAGNOSIS — F419 Anxiety disorder, unspecified: Secondary | ICD-10-CM | POA: Diagnosis not present

## 2015-06-22 DIAGNOSIS — Z0001 Encounter for general adult medical examination with abnormal findings: Secondary | ICD-10-CM | POA: Diagnosis not present

## 2015-06-22 DIAGNOSIS — Z1231 Encounter for screening mammogram for malignant neoplasm of breast: Secondary | ICD-10-CM

## 2015-06-28 ENCOUNTER — Ambulatory Visit (HOSPITAL_COMMUNITY)
Admission: RE | Admit: 2015-06-28 | Discharge: 2015-06-28 | Disposition: A | Discharge status: Home / Self Care | Payer: Medicare PPO | Source: Ambulatory Visit | Attending: Physician Assistant | Admitting: Physician Assistant

## 2015-06-28 DIAGNOSIS — Z1231 Encounter for screening mammogram for malignant neoplasm of breast: Secondary | ICD-10-CM | POA: Insufficient documentation

## 2015-07-11 DIAGNOSIS — Z6832 Body mass index (BMI) 32.0-32.9, adult: Secondary | ICD-10-CM | POA: Diagnosis not present

## 2015-07-11 DIAGNOSIS — Z1389 Encounter for screening for other disorder: Secondary | ICD-10-CM | POA: Diagnosis not present

## 2015-07-11 DIAGNOSIS — R7309 Other abnormal glucose: Secondary | ICD-10-CM | POA: Diagnosis not present

## 2015-07-11 DIAGNOSIS — E6609 Other obesity due to excess calories: Secondary | ICD-10-CM | POA: Diagnosis not present

## 2015-07-23 ENCOUNTER — Emergency Department (HOSPITAL_COMMUNITY)
Admission: EM | Admit: 2015-07-23 | Discharge: 2015-07-23 | Disposition: A | Discharge status: Home / Self Care | Payer: Medicare PPO | Attending: Emergency Medicine | Admitting: Emergency Medicine

## 2015-07-23 ENCOUNTER — Ambulatory Visit (HOSPITAL_COMMUNITY): Admission: EM | Admit: 2015-07-23 | Payer: Medicare PPO | Source: Home / Self Care | Admitting: Cardiovascular Disease

## 2015-07-23 ENCOUNTER — Encounter (HOSPITAL_COMMUNITY): Payer: Self-pay

## 2015-07-23 ENCOUNTER — Emergency Department (HOSPITAL_COMMUNITY): Payer: Medicare PPO

## 2015-07-23 DIAGNOSIS — Z720 Tobacco use: Secondary | ICD-10-CM | POA: Diagnosis not present

## 2015-07-23 DIAGNOSIS — R05 Cough: Secondary | ICD-10-CM | POA: Diagnosis not present

## 2015-07-23 DIAGNOSIS — Z8659 Personal history of other mental and behavioral disorders: Secondary | ICD-10-CM | POA: Diagnosis not present

## 2015-07-23 DIAGNOSIS — I10 Essential (primary) hypertension: Secondary | ICD-10-CM | POA: Insufficient documentation

## 2015-07-23 DIAGNOSIS — Z8719 Personal history of other diseases of the digestive system: Secondary | ICD-10-CM | POA: Diagnosis not present

## 2015-07-23 DIAGNOSIS — R791 Abnormal coagulation profile: Secondary | ICD-10-CM | POA: Diagnosis not present

## 2015-07-23 DIAGNOSIS — Z8639 Personal history of other endocrine, nutritional and metabolic disease: Secondary | ICD-10-CM | POA: Diagnosis not present

## 2015-07-23 DIAGNOSIS — I2699 Other pulmonary embolism without acute cor pulmonale: Secondary | ICD-10-CM

## 2015-07-23 DIAGNOSIS — R7989 Other specified abnormal findings of blood chemistry: Secondary | ICD-10-CM

## 2015-07-23 DIAGNOSIS — J40 Bronchitis, not specified as acute or chronic: Secondary | ICD-10-CM | POA: Diagnosis not present

## 2015-07-23 DIAGNOSIS — J441 Chronic obstructive pulmonary disease with (acute) exacerbation: Secondary | ICD-10-CM | POA: Diagnosis not present

## 2015-07-23 DIAGNOSIS — R069 Unspecified abnormalities of breathing: Secondary | ICD-10-CM | POA: Diagnosis not present

## 2015-07-23 DIAGNOSIS — R7889 Finding of other specified substances, not normally found in blood: Secondary | ICD-10-CM | POA: Diagnosis not present

## 2015-07-23 DIAGNOSIS — G8929 Other chronic pain: Secondary | ICD-10-CM | POA: Diagnosis not present

## 2015-07-23 DIAGNOSIS — R0602 Shortness of breath: Secondary | ICD-10-CM | POA: Diagnosis not present

## 2015-07-23 DIAGNOSIS — Z8739 Personal history of other diseases of the musculoskeletal system and connective tissue: Secondary | ICD-10-CM | POA: Diagnosis not present

## 2015-07-23 HISTORY — DX: Type 2 diabetes mellitus without complications: E11.9

## 2015-07-23 LAB — CBC WITH DIFFERENTIAL/PLATELET
BASOS ABS: 0 10*3/uL (ref 0.0–0.1)
Basophils Relative: 0 % (ref 0–1)
Eosinophils Absolute: 0.4 10*3/uL (ref 0.0–0.7)
Eosinophils Relative: 4 % (ref 0–5)
HCT: 40.3 % (ref 36.0–46.0)
Hemoglobin: 13.9 g/dL (ref 12.0–15.0)
Lymphocytes Relative: 26 % (ref 12–46)
Lymphs Abs: 2.5 10*3/uL (ref 0.7–4.0)
MCH: 32.4 pg (ref 26.0–34.0)
MCHC: 34.5 g/dL (ref 30.0–36.0)
MCV: 93.9 fL (ref 78.0–100.0)
Monocytes Absolute: 0.8 10*3/uL (ref 0.1–1.0)
Monocytes Relative: 8 % (ref 3–12)
Neutro Abs: 5.9 10*3/uL (ref 1.7–7.7)
Neutrophils Relative %: 62 % (ref 43–77)
PLATELETS: 289 10*3/uL (ref 150–400)
RBC: 4.29 MIL/uL (ref 3.87–5.11)
RDW: 14 % (ref 11.5–15.5)
WBC: 9.6 10*3/uL (ref 4.0–10.5)

## 2015-07-23 LAB — BASIC METABOLIC PANEL
Anion gap: 9 (ref 5–15)
BUN: 6 mg/dL (ref 6–20)
CO2: 25 mmol/L (ref 22–32)
Calcium: 9.2 mg/dL (ref 8.9–10.3)
Chloride: 107 mmol/L (ref 101–111)
Creatinine, Ser: 0.97 mg/dL (ref 0.44–1.00)
GFR calc non Af Amer: 58 mL/min — ABNORMAL LOW (ref >60)
Glucose, Bld: 131 mg/dL — ABNORMAL HIGH (ref 65–99)
POTASSIUM: 3.9 mmol/L (ref 3.5–5.1)
SODIUM: 141 mmol/L (ref 135–145)

## 2015-07-23 LAB — BRAIN NATRIURETIC PEPTIDE: B Natriuretic Peptide: 63.4 pg/mL (ref 0.0–100.0)

## 2015-07-23 LAB — I-STAT TROPONIN, ED: TROPONIN I, POC: 0.01 ng/mL (ref 0.00–0.08)

## 2015-07-23 LAB — D-DIMER, QUANTITATIVE: D-Dimer, Quant: 2.01 ug/mL-FEU — ABNORMAL HIGH (ref 0.00–0.48)

## 2015-07-23 MED ORDER — XARELTO VTE STARTER PACK 15 & 20 MG PO TBPK: 15.0000 mg | ORAL_TABLET | ORAL | Status: DC

## 2015-07-23 MED ORDER — IOHEXOL 350 MG/ML SOLN
80.0000 mL | Freq: Once | INTRAVENOUS | Status: AC | PRN
  Administered 2015-07-23: 70 mL via INTRAVENOUS

## 2015-07-23 MED ORDER — ALBUTEROL (5 MG/ML) CONTINUOUS INHALATION SOLN
10.0000 mg/h | INHALATION_SOLUTION | RESPIRATORY_TRACT | Status: DC
  Administered 2015-07-23: 10 mg/h via RESPIRATORY_TRACT
  Filled 2015-07-23: qty 20

## 2015-07-23 MED ORDER — METHYLPREDNISOLONE SODIUM SUCC 125 MG IJ SOLR
125.0000 mg | Freq: Once | INTRAMUSCULAR | Status: AC
  Administered 2015-07-23: 125 mg via INTRAVENOUS
  Filled 2015-07-23: qty 2

## 2015-07-23 MED ORDER — PREDNISONE 50 MG PO TABS: 50.0000 mg | ORAL_TABLET | Freq: Every day | ORAL | Status: DC

## 2015-07-23 NOTE — ED Provider Notes (Signed)
The patient is a 69 year old female who has a history of long-time smoker, she denies any lung history or heart history, no CHF, no coronary disease, denies using an albuterol inhaler at home. She also denies any recent coughing fevers or chills.  On exam the patient has diffuse mild expiratory wheezing, she is speaking in full sentences, she has no peripheral edema, her heart sounds are normal without murmurs, no JVD, no peripheral edema, normal pulses at the radial arteries. EKG shows   EKG Interpretation  Date/Time:  Sunday July 23 2015 19:41:09 EDT Ventricular Rate:  80 PR Interval:  186 QRS Duration: 134 QT Interval:  439 QTC Calculation: 506 R Axis:   -46 Text Interpretation:  Sinus rhythm Multiform ventricular premature complexes Left bundle branch block since last tracing no significant change Confirmed by Kayana Thoen  MD, Chadwin Fury ([id number]) on 07/23/2015 7:43:38 PM      Chest x-ray pending, labs, troponin, possibly COPD given her long-time smoking history along with her wheezing. She denies chest pain at this time and states it is just difficult to breathe. It was acute in onset, but also consider pulmonary embolism in the differential diagnosis. Based on her age she is not able to avoid testing.  I saw and evaluated the patient, reviewed the resident's note and I agree with the findings and plan.  Final diagnoses:  Positive D dimer  COPD exacerbation  Pulmonary embolism  Bronchitis      Noemi Chapel, MD 07/24/15 1530

## 2015-07-23 NOTE — ED Provider Notes (Signed)
History   Chief Complaint  Patient presents with  . Shortness of Breath    HPI Patient is a 69 year old female with past medical history as below notable for smoking, wheezing although no reported history of COPD, no known coronary artery disease, no history of DVT/PE who presents to ED complaining of acute onset shortness of breath that began while at home at rest today. Patient called EMS and on their arrival patient was diaphoretic and in respiratory distress. They placed 2 L nasal cannula on patient and patient reported to have improvement in symptoms. EMS EKG showed left bundle branch block and patient initially indicated as code STEMI based on this finding. There was no visible ST depression or ST elevation however. Patient states prior to today she was in her usual state of health. She denies having any chest pain. Denies recent illness, cough, nausea, vomiting, leg pain, leg swelling. She denies modifying factors. Says her breathing is much better now. Patient has not been using inhalers. No other complaints at this time.  Past medical/surgical history, social history, medications, allergies and FH have been reviewed with patient and/or in documentation. Furthermore, if pt family or friend(s) present, additional historical information was obtained from them.  Past Medical History  Diagnosis Date  . Hypertension   . Hyperlipidemia   . DDD (degenerative disc disease)     With chronic pain  . Osteoporosis   . Prolonged Q-T interval on ECG 10/07/2013    Secondary to hypokalemia  . Hypokalemia 10/06/2013    Secondary to lasix  . Tobacco abuse 10/07/2013  . Hypoxia 10/07/2013    From mild resp depression from opioids  . Gallstone   . Diastolic dysfunction 15/7262  . Mitral valve prolapse 09/2013    Mild per echo  . Thyroid disease   . Psychosis    Past Surgical History  Procedure Laterality Date  . Thyroid surgery     Family History  Problem Relation Age of Onset  .  Schizophrenia Maternal Grandfather    History  Substance Use Topics  . Smoking status: Current Every Day Smoker  . Smokeless tobacco: Not on file  . Alcohol Use: No     Review of Systems Constitutional: - F/C, -fatigue.  HENT: - congestion, -rhinorrhea, -sore throat.   Eyes: - eye pain, -visual disturbance.  Respiratory: - cough, +SOB, -hemoptysis.   Cardiovascular: - CP, -palps.  Gastrointestinal: - N/V/D, -abd pain  Genitourinary: - flank pain, -dysuria, -frequency.  Musculoskeletal: - myalgia/arthritis, -joint swelling, -gait abnormality, -back pain, -neck pain/stiffness, -leg pain/swelling.  Skin: - rash/lesion.  Neurological: - focal weakness, -lightheadedness, -dizziness, -numbness, -HA.  All other systems reviewed and are negative.   Physical Exam  Physical Exam ED Triage Vitals  Enc Vitals Group     BP 07/23/15 2000 164/59 mmHg     Pulse Rate 07/23/15 2000 85     Resp 07/23/15 2000 26     Temp --      Temp src --      SpO2 07/23/15 1940 100 %     Weight --      Height --      Head Cir --      Peak Flow --      Pain Score 07/23/15 1909 0     Pain Loc --      Pain Edu? --      Excl. in [place]? --    Constitutional: Patient is chronically ill appearing and in mild acute resp distress  Head: Normocephalic and atraumatic.  Eyes: Extraocular motion intact, no scleral icterus Mouth: MMM, OP clear Neck: Supple without meningismus, mass, or overt JVD Respiratory: respiratory distress. Inc WOB. Exp wheezes. No rales. CV: RRR, no obvious murmurs.  Pulses +2 and symmetric. Euvolemic Abdomen: Soft, NT, ND, no r/g. No mass.  MSK: Extremities are atraumatic without deformity, ROM intact Skin: Warm, dry, intact without rash Neuro: AAOx4, MAE 5/5 sym, no focal deficit noted   ED Course  Procedures   Labs Reviewed  BASIC METABOLIC PANEL - Abnormal; Notable for the following:    Glucose, Bld 131 (*)    GFR calc non Af Amer 58 (*)    All other components within normal  limits  D-DIMER, QUANTITATIVE (NOT AT [facility]) - Abnormal; Notable for the following:    D-Dimer, Quant 2.01 (*)    All other components within normal limits  BRAIN NATRIURETIC PEPTIDE  CBC WITH DIFFERENTIAL/PLATELET  I-STAT TROPOININ, ED   I personally reviewed and interpreted all labs.  DG Chest Portable 1 View    (Results Pending)   I personally viewed above image(s) which were used in my medical decision making. Formal interpretations by Radiology.  MDM: Rhonda Santos is a 69 y.o. female with H&P as above who p/w CC: SOB  EKG: there are no previous tracings available for comparison. normal sinus rhythm, LBBB, frequent PVC's noted. No STE/STD.  Medications  albuterol (PROVENTIL,VENTOLIN) solution continuous neb (10 mg/hr Nebulization New Bag/Given 07/23/15 1941)  methylPREDNISolone sodium succinate (SOLU-MEDROL) 125 mg/2 mL injection 125 mg (125 mg Intravenous Given 07/23/15 1943)   9:08 PM Cards fellow stopped to inform LBBB is old since 38.  -Results: dimer elevated; PE scan ordered.  -Re-evaluation: wheezing improved.   CT shows very tiny subsegmental pulmonary embolism. Following breathing treatment patient reports she feels significantly better and at baseline. Patient is not requiring oxygen and is able to ambulate without desat. Patient will be prescribed his a relative and prednisone. Patient will follow up closely with her PCP. Patient is stable for discharge. Old records reviewed (if available). Labs and imaging reviewed personally by myself and considered in medical decision making if ordered. Clinical Impression: 1. COPD exacerbation   2. Positive D dimer   3. Pulmonary embolism   4. Bronchitis     Disposition: Discharge  Condition: Good  I have discussed the results, Dx and Tx plan with the pt(& family if present). He/she/they expressed understanding and agree(s) with the plan. Discharge instructions discussed at great length. Strict return precautions  discussed and pt &/or family have verbalized understanding of the instructions. No further questions at time of discharge.    Discharge Medication List as of 07/23/2015 10:45 PM    START taking these medications   Details  predniSONE (DELTASONE) 50 MG tablet Take 1 tablet (50 mg total) by mouth daily., Starting 07/23/2015, Until Discontinued, Print    XARELTO STARTER PACK 15 & 20 MG TBPK Take 15-20 mg by mouth as directed. Take as directed on package: Start with one 15mg  tablet by mouth twice a day with food. On Day 22, switch to one 20mg  tablet once a day with food., Starting 07/23/2015, Until Discontinued, Print        Follow Up: Cory Munch, PA-C [address] [place] [id number] [phone]  Schedule an appointment as soon as possible for a visit in 3 days   [place] [address] [id number] [place] [phone]  If  symptoms worsen   Pt seen in conjunction with Dr. Henderson [place], [facility] Resident - PGY-3      Kirstie Peri, MD 07/24/15 Benancio Deeds  Noemi Chapel, MD 07/24/15 [phone]

## 2015-07-23 NOTE — ED Notes (Signed)
MD at bedside. 

## 2015-07-23 NOTE — ED Notes (Signed)
Per EMS: Pt began having some shortness of breath at rest at home. Denies any pain. EMS found LBBB on EKG. No ASA, d/t home Physician. Pt admits to recent weight gain and leg swelling.

## 2015-07-23 NOTE — Discharge Instructions (Signed)
Chronic Obstructive Pulmonary Disease Chronic obstructive pulmonary disease (COPD) is a common lung problem. In COPD, the flow of air from the lungs is limited. The way your lungs work will probably never return to normal, but there are things you can do to improve your lungs and make yourself feel better. HOME CARE  Take all medicines as told by your doctor.  Avoid medicines or cough syrups that dry up your airway (such as antihistamines) and do not allow you to get rid of thick spit. You do not need to avoid them if told differently by your doctor.  If you smoke, stop. Smoking makes the problem worse.  Avoid being around things that make your breathing worse (like smoke, chemicals, and fumes).  Use oxygen therapy and therapy to help improve your lungs (pulmonary rehabilitation) if told by your doctor. If you need home oxygen therapy, ask your doctor if you should buy a tool to measure your oxygen level (oximeter).  Avoid people who have a sickness you can catch (contagious).  Avoid going outside when it is very hot, cold, or humid.  Eat healthy foods. Eat smaller meals more often. Rest before meals.  Stay active, but remember to also rest.  Make sure to get all the shots (vaccines) your doctor recommends. Ask your doctor if you need a pneumonia shot.  Learn and use tips on how to relax.  Learn and use tips on how to control your breathing as told by your doctor. Try:  Breathing in (inhaling) through your nose for 1 second. Then, pucker your lips and breath out (exhale) through your lips for 2 seconds.  Putting one hand on your belly (abdomen). Breathe in slowly through your nose for 1 second. Your hand on your belly should move out. Pucker your lips and breathe out slowly through your lips. Your hand on your belly should move in as you breathe out.  Learn and use controlled coughing to clear thick spit from your lungs. The steps are:  Lean your head a little forward.  Breathe in  deeply.  Try to hold your breath for 3 seconds.  Keep your mouth slightly open while coughing 2 times.  Spit any thick spit out into a tissue.  Rest and do the steps again 1 or 2 times as needed. GET HELP IF:  You cough up more thick spit than usual.  There is a change in the color or thickness of the spit.  It is harder to breathe than usual.  Your breathing is faster than usual. GET HELP RIGHT AWAY IF:   You have shortness of breath while resting.  You have shortness of breath that stops you from:  Being able to talk.  Doing normal activities.  You chest hurts for longer than 5 minutes.  Your skin color is more blue than usual.  Your pulse oximeter shows that you have low oxygen for longer than 5 minutes. MAKE SURE YOU:   Understand these instructions.  Will watch your condition.  Will get help right away if you are not doing well or get worse. Document Released: 05/27/2008 Document Revised: 04/25/2014 Document Reviewed: 08/05/2013 [facility] Patient Information 2015 [place], [place]. This information is not intended to replace advice given to you by your health care provider. Make sure you discuss any questions you have with your health care provider.  Pulmonary Embolism A pulmonary (lung) embolism (PE) is a blood clot that has traveled to the lung and results in a blockage of blood flow in  the affected lung. Most clots come from deep veins in the legs or pelvis. PE is a dangerous and potentially life-threatening condition that can be treated if identified. CAUSES Blood clots form in a vein for different reasons. Usually several things cause blood clots. They include:  The flow of blood slows down.  The inside of the vein is damaged in some way.  The person has a condition that makes the blood clot more easily. RISK FACTORS Some people are more likely than others to develop PE. Risk factors include:   Smoking.  Being overweight (obese).  Sitting or lying  still for a long time. This includes long-distance travel, paralysis, or recovery from an illness or surgery. Other factors that increase risk are:   Older age, especially over 52 years of age.  Having a family history of blood clots or if you have already had a blood clot.  Having major or lengthy surgery. This is especially true for surgery on the hip, knee, or belly (abdomen). Hip surgery is particularly high risk.  Having a long, thin tube (catheter) placed inside a vein during a medical procedure.  Breaking a hip or leg.  Having cancer or cancer treatment.  Medicines containing the female hormone estrogen. This includes birth control pills and hormone replacement therapy.  Other circulation or heart problems.  Pregnancy and childbirth.  Hormone changes make the blood clot more easily during pregnancy.  The fetus puts pressure on the veins of the pelvis.  There is a risk of injury to veins during delivery or a caesarean delivery. The risk is highest just after childbirth.  PREVENTION   Exercise the legs regularly. Take a brisk 30 minute walk every day.  Maintain a weight that is appropriate for your height.  Avoid sitting or lying in bed for long periods of time without moving your legs.  Women, particularly those over the age of 76 years, should consider the risks and benefits of taking estrogen medicines, including birth control pills.  Do not smoke, especially if you take estrogen medicines.  Long-distance travel can increase your risk. You should exercise your legs by walking or pumping the muscles every hour.  Many of the risk factors above relate to situations that exist with hospitalization, either for illness, injury, or elective surgery. Prevention may include medical and nonmedical measures.   Your health care provider will assess you for the need for venous thromboembolism prevention when you are admitted to the hospital. If you are having surgery, your  surgeon will assess you the day of or day after surgery.  SYMPTOMS  The symptoms of a PE usually start suddenly and include:  Shortness of breath.  Coughing.  Coughing up blood or blood-tinged mucus.  Chest pain. Pain is often worse with deep breaths.  Rapid heartbeat. DIAGNOSIS  If a PE is suspected, your health care provider will take a medical history and perform a physical exam. Other tests that may be required include:  Blood tests, such as studies of the clotting properties of your blood.  Imaging tests, such as ultrasound, CT, MRI, and other tests to see if you have clots in your legs or lungs.  An electrocardiogram. This can look for heart strain from blood clots in the lungs. TREATMENT   The most common treatment for a PE is blood thinning (anticoagulant) medicine, which reduces the blood's tendency to clot. Anticoagulants can stop new blood clots from forming and old clots from growing. They cannot dissolve existing clots. Your body  does this by itself over time. Anticoagulants can be given by mouth, through an intravenous (IV) tube, or by injection. Your health care provider will determine the best program for you.  Less commonly, clot-dissolving medicines (thrombolytics) are used to dissolve a PE. They carry a high risk of bleeding, so they are used mainly in severe cases.  Very rarely, a blood clot in the leg needs to be removed surgically.  If you are unable to take anticoagulants, your health care provider may arrange for you to have a filter placed in a main vein in your abdomen. This filter prevents clots from traveling to your lungs. HOME CARE INSTRUCTIONS   Take all medicines as directed by your health care provider.  Learn as much as you can about DVT.  Wear a medical alert bracelet or carry a medical alert card.  Ask your health care provider how soon you can go back to normal activities. It is important to stay active to prevent blood clots. If you are on  anticoagulant medicine, avoid contact sports.  It is very important to exercise. This is especially important while traveling, sitting, or standing for long periods of time. Exercise your legs by walking or by tightening and relaxing your leg muscles regularly. Take frequent walks.  You may need to wear compression stockings. These are tight elastic stockings that apply pressure to the lower legs. This pressure can help keep the blood in the legs from clotting. Taking Warfarin Warfarin is a daily medicine that is taken by mouth. Your health care provider will advise you on the length of treatment (usually 3-6 months, sometimes lifelong). If you take warfarin:  Understand how to take warfarin and foods that can affect how warfarin works in Veterinary surgeon.  Too much and too little warfarin are both dangerous. Too much warfarin increases the risk of bleeding. Too little warfarin continues to allow the risk for blood clots. Warfarin and Regular Blood Testing While taking warfarin, you will need to have regular blood tests to measure your blood clotting time. These blood tests usually include both the prothrombin time (PT) and international normalized ratio (INR) tests. The PT and INR results allow your health care provider to adjust your dose of warfarin. It is very important that you have your PT and INR tested as often as directed by your health care provider.  Warfarin and Your Diet Avoid major changes in your diet, or notify your health care provider before changing your diet. Arrange a visit with a registered dietitian to answer your questions. Many foods, especially foods high in vitamin K, can interfere with warfarin and affect the PT and INR results. You should eat a consistent amount of foods high in vitamin K. Foods high in vitamin K include:   Spinach, kale, broccoli, cabbage, collard and turnip greens, Brussels sprouts, peas, cauliflower, seaweed, and parsley.  Beef and pork liver.  Green  tea.  Soybean oil. Warfarin with Other Medicines Many medicines can interfere with warfarin and affect the PT and INR results. You must:  Tell your health care provider about any and all medicines, vitamins, and supplements you take, including aspirin and other over-the-counter anti-inflammatory medicines. Be especially cautious with aspirin and anti-inflammatory medicines. Ask your health care provider before taking these.  Do not take or discontinue any prescribed or over-the-counter medicine except on the advice of your health care provider or pharmacist. Warfarin Side Effects Warfarin can have side effects, such as easy bruising and difficulty stopping bleeding. Ask your  health care provider or pharmacist about other side effects of warfarin. You will need to:  Hold pressure over cuts for longer than usual.  Notify your dentist and other health care providers that you are taking warfarin before you undergo any procedures where bleeding may occur. Warfarin with Alcohol and Tobacco   Drinking alcohol frequently can increase the effect of warfarin, leading to excess bleeding. It is best to avoid alcoholic drinks or consume only very small amounts while taking warfarin. Notify your health care provider if you change your alcohol intake.  Do not use any tobacco products including cigarettes, chewing tobacco, or electronic cigarettes. If you smoke, quit. Ask your health care provider for help with quitting smoking. Alternative Medicines to Warfarin: Factor Xa Inhibitor Medicines  These blood thinning medicines are taken by mouth, usually for several weeks or longer. It is important to take the medicine every single day, at the same time each day.  There are no regular blood tests required when using these medicines.  There are fewer food and drug interactions than with warfarin.  The side effects of this class of medicine is similar to that of warfarin, including excessive bruising or  bleeding. Ask your health care provider or pharmacist about other potential side effects. SEEK MEDICAL CARE IF:   You notice a rapid heartbeat.  You feel weaker or more tired than usual.  You feel faint.  You notice increased bruising.  Your symptoms are not getting better in the time expected.  You are having side effects of medicine. SEEK IMMEDIATE MEDICAL CARE IF:   You have chest pain.  You have trouble breathing.  You have new or increased swelling or pain in one leg.  You cough up blood.  You notice blood in vomit, in a bowel movement, or in urine.  You have a fever. Symptoms of PE may represent a serious problem that is an emergency. Do not wait to see if the symptoms will go away. Get medical help right away. Call your local emergency services (911 in the [place]). Do not drive yourself to the hospital. Document Released: 12/06/2000 Document Revised: 04/25/2014 Document Reviewed: 12/20/2013 [facility] Patient Information 2015 [place], [place]. This information is not intended to replace advice given to you by your health care provider. Make sure you discuss any questions you have with your health care provider.

## 2015-07-26 DIAGNOSIS — Z6832 Body mass index (BMI) 32.0-32.9, adult: Secondary | ICD-10-CM | POA: Diagnosis not present

## 2015-07-26 DIAGNOSIS — I10 Essential (primary) hypertension: Secondary | ICD-10-CM | POA: Diagnosis not present

## 2015-07-26 DIAGNOSIS — Z1389 Encounter for screening for other disorder: Secondary | ICD-10-CM | POA: Diagnosis not present

## 2015-07-26 DIAGNOSIS — I2699 Other pulmonary embolism without acute cor pulmonale: Secondary | ICD-10-CM | POA: Diagnosis not present

## 2015-07-26 DIAGNOSIS — E6609 Other obesity due to excess calories: Secondary | ICD-10-CM | POA: Diagnosis not present

## 2015-08-08 DIAGNOSIS — M79606 Pain in leg, unspecified: Secondary | ICD-10-CM | POA: Diagnosis not present

## 2015-08-08 DIAGNOSIS — M5416 Radiculopathy, lumbar region: Secondary | ICD-10-CM | POA: Diagnosis not present

## 2015-08-08 DIAGNOSIS — Z79899 Other long term (current) drug therapy: Secondary | ICD-10-CM | POA: Diagnosis not present

## 2015-08-08 DIAGNOSIS — M1990 Unspecified osteoarthritis, unspecified site: Secondary | ICD-10-CM | POA: Diagnosis not present

## 2015-08-24 DIAGNOSIS — K7460 Unspecified cirrhosis of liver: Secondary | ICD-10-CM

## 2015-08-24 HISTORY — DX: Unspecified cirrhosis of liver: K74.60

## 2015-09-05 ENCOUNTER — Encounter (HOSPITAL_COMMUNITY): Payer: Self-pay | Admitting: Emergency Medicine

## 2015-09-05 ENCOUNTER — Inpatient Hospital Stay (HOSPITAL_COMMUNITY)
Admission: EM | Admit: 2015-09-05 | Discharge: 2015-09-07 | DRG: 315 | Disposition: A | Discharge status: Home / Self Care | Payer: Medicare PPO | Attending: Internal Medicine | Admitting: Internal Medicine

## 2015-09-05 ENCOUNTER — Emergency Department (HOSPITAL_COMMUNITY): Payer: Medicare PPO

## 2015-09-05 DIAGNOSIS — A419 Sepsis, unspecified organism: Secondary | ICD-10-CM | POA: Diagnosis not present

## 2015-09-05 DIAGNOSIS — N189 Chronic kidney disease, unspecified: Secondary | ICD-10-CM | POA: Diagnosis present

## 2015-09-05 DIAGNOSIS — Z79899 Other long term (current) drug therapy: Secondary | ICD-10-CM | POA: Diagnosis not present

## 2015-09-05 DIAGNOSIS — Z86711 Personal history of pulmonary embolism: Secondary | ICD-10-CM | POA: Diagnosis not present

## 2015-09-05 DIAGNOSIS — E890 Postprocedural hypothyroidism: Secondary | ICD-10-CM | POA: Diagnosis not present

## 2015-09-05 DIAGNOSIS — K8020 Calculus of gallbladder without cholecystitis without obstruction: Secondary | ICD-10-CM | POA: Diagnosis not present

## 2015-09-05 DIAGNOSIS — E872 Acidosis: Secondary | ICD-10-CM | POA: Diagnosis present

## 2015-09-05 DIAGNOSIS — Z886 Allergy status to analgesic agent status: Secondary | ICD-10-CM

## 2015-09-05 DIAGNOSIS — E1165 Type 2 diabetes mellitus with hyperglycemia: Secondary | ICD-10-CM | POA: Diagnosis not present

## 2015-09-05 DIAGNOSIS — I959 Hypotension, unspecified: Principal | ICD-10-CM | POA: Diagnosis present

## 2015-09-05 DIAGNOSIS — R100 Acute abdomen: Secondary | ICD-10-CM | POA: Diagnosis not present

## 2015-09-05 DIAGNOSIS — F29 Unspecified psychosis not due to a substance or known physiological condition: Secondary | ICD-10-CM | POA: Diagnosis not present

## 2015-09-05 DIAGNOSIS — IMO0001 Reserved for inherently not codable concepts without codable children: Secondary | ICD-10-CM | POA: Insufficient documentation

## 2015-09-05 DIAGNOSIS — Z720 Tobacco use: Secondary | ICD-10-CM | POA: Diagnosis present

## 2015-09-05 DIAGNOSIS — N179 Acute kidney failure, unspecified: Secondary | ICD-10-CM | POA: Diagnosis not present

## 2015-09-05 DIAGNOSIS — Z7901 Long term (current) use of anticoagulants: Secondary | ICD-10-CM | POA: Diagnosis not present

## 2015-09-05 DIAGNOSIS — R9431 Abnormal electrocardiogram [ECG] [EKG]: Secondary | ICD-10-CM | POA: Diagnosis present

## 2015-09-05 DIAGNOSIS — R011 Cardiac murmur, unspecified: Secondary | ICD-10-CM | POA: Diagnosis not present

## 2015-09-05 DIAGNOSIS — I5189 Other ill-defined heart diseases: Secondary | ICD-10-CM | POA: Diagnosis present

## 2015-09-05 DIAGNOSIS — E1122 Type 2 diabetes mellitus with diabetic chronic kidney disease: Secondary | ICD-10-CM | POA: Diagnosis present

## 2015-09-05 DIAGNOSIS — I5032 Chronic diastolic (congestive) heart failure: Secondary | ICD-10-CM | POA: Diagnosis not present

## 2015-09-05 DIAGNOSIS — D649 Anemia, unspecified: Secondary | ICD-10-CM | POA: Diagnosis present

## 2015-09-05 DIAGNOSIS — G8929 Other chronic pain: Secondary | ICD-10-CM | POA: Diagnosis present

## 2015-09-05 DIAGNOSIS — R7989 Other specified abnormal findings of blood chemistry: Secondary | ICD-10-CM | POA: Diagnosis present

## 2015-09-05 DIAGNOSIS — E785 Hyperlipidemia, unspecified: Secondary | ICD-10-CM | POA: Diagnosis not present

## 2015-09-05 DIAGNOSIS — N183 Chronic kidney disease, stage 3 (moderate): Secondary | ICD-10-CM | POA: Diagnosis present

## 2015-09-05 DIAGNOSIS — I129 Hypertensive chronic kidney disease with stage 1 through stage 4 chronic kidney disease, or unspecified chronic kidney disease: Secondary | ICD-10-CM | POA: Diagnosis not present

## 2015-09-05 DIAGNOSIS — R109 Unspecified abdominal pain: Secondary | ICD-10-CM | POA: Diagnosis present

## 2015-09-05 DIAGNOSIS — F17210 Nicotine dependence, cigarettes, uncomplicated: Secondary | ICD-10-CM | POA: Diagnosis not present

## 2015-09-05 DIAGNOSIS — I519 Heart disease, unspecified: Secondary | ICD-10-CM | POA: Diagnosis present

## 2015-09-05 DIAGNOSIS — D631 Anemia in chronic kidney disease: Secondary | ICD-10-CM | POA: Diagnosis present

## 2015-09-05 DIAGNOSIS — M810 Age-related osteoporosis without current pathological fracture: Secondary | ICD-10-CM | POA: Diagnosis not present

## 2015-09-05 DIAGNOSIS — R05 Cough: Secondary | ICD-10-CM | POA: Diagnosis not present

## 2015-09-05 DIAGNOSIS — D509 Iron deficiency anemia, unspecified: Secondary | ICD-10-CM | POA: Diagnosis not present

## 2015-09-05 DIAGNOSIS — Z7952 Long term (current) use of systemic steroids: Secondary | ICD-10-CM | POA: Diagnosis not present

## 2015-09-05 DIAGNOSIS — I341 Nonrheumatic mitral (valve) prolapse: Secondary | ICD-10-CM | POA: Diagnosis present

## 2015-09-05 LAB — PROCALCITONIN

## 2015-09-05 LAB — LACTIC ACID, PLASMA
Lactic Acid, Venous: 1.4 mmol/L (ref 0.5–2.0)
Lactic Acid, Venous: 1.3 mmol/L (ref 0.5–2.0)

## 2015-09-05 LAB — PROTIME-INR
INR: 1.97 — ABNORMAL HIGH (ref 0.00–1.49)
Prothrombin Time: 22.3 seconds — ABNORMAL HIGH (ref 11.6–15.2)

## 2015-09-05 LAB — CBC WITH DIFFERENTIAL/PLATELET
Basophils Absolute: 0 10*3/uL (ref 0.0–0.1)
Basophils Absolute: 0 10*3/uL (ref 0.0–0.1)
Basophils Relative: 0 % (ref 0–1)
Basophils Relative: 0 % (ref 0–1)
EOS ABS: 0.2 10*3/uL (ref 0.0–0.7)
EOS ABS: 0.2 10*3/uL (ref 0.0–0.7)
EOS PCT: 3 % (ref 0–5)
EOS PCT: 2 % (ref 0–5)
HCT: 34 % — ABNORMAL LOW (ref 36.0–46.0)
HCT: 36.2 % (ref 36.0–46.0)
HEMOGLOBIN: 11.8 g/dL — AB (ref 12.0–15.0)
Hemoglobin: 11.1 g/dL — ABNORMAL LOW (ref 12.0–15.0)
LYMPHS ABS: 2.6 10*3/uL (ref 0.7–4.0)
LYMPHS ABS: 3.5 10*3/uL (ref 0.7–4.0)
Lymphocytes Relative: 31 % (ref 12–46)
Lymphocytes Relative: 37 % (ref 12–46)
MCH: 30.6 pg (ref 26.0–34.0)
MCH: 30.6 pg (ref 26.0–34.0)
MCHC: 32.6 g/dL (ref 30.0–36.0)
MCHC: 32.6 g/dL (ref 30.0–36.0)
MCV: 93.7 fL (ref 78.0–100.0)
MCV: 94 fL (ref 78.0–100.0)
MONO ABS: 0.6 10*3/uL (ref 0.1–1.0)
MONOS PCT: 7 % (ref 3–12)
Monocytes Absolute: 0.6 10*3/uL (ref 0.1–1.0)
Monocytes Relative: 7 % (ref 3–12)
NEUTROS PCT: 54 % (ref 43–77)
Neutro Abs: 4.9 10*3/uL (ref 1.7–7.7)
Neutro Abs: 5 10*3/uL (ref 1.7–7.7)
Neutrophils Relative %: 59 % (ref 43–77)
PLATELETS: 294 10*3/uL (ref 150–400)
Platelets: 292 10*3/uL (ref 150–400)
RBC: 3.63 MIL/uL — AB (ref 3.87–5.11)
RBC: 3.85 MIL/uL — ABNORMAL LOW (ref 3.87–5.11)
RDW: 13.4 % (ref 11.5–15.5)
RDW: 13.4 % (ref 11.5–15.5)
WBC: 8.3 10*3/uL (ref 4.0–10.5)
WBC: 9.4 10*3/uL (ref 4.0–10.5)

## 2015-09-05 LAB — COMPREHENSIVE METABOLIC PANEL
ALBUMIN: 2.7 g/dL — AB (ref 3.5–5.0)
ALT: 22 U/L (ref 14–54)
AST: 35 U/L (ref 15–41)
Alkaline Phosphatase: 112 U/L (ref 38–126)
Anion gap: 6 (ref 5–15)
BILIRUBIN TOTAL: 0.6 mg/dL (ref 0.3–1.2)
BUN: 5 mg/dL — ABNORMAL LOW (ref 6–20)
CHLORIDE: 106 mmol/L (ref 101–111)
CO2: 26 mmol/L (ref 22–32)
CREATININE: 1.2 mg/dL — AB (ref 0.44–1.00)
Calcium: 8.5 mg/dL — ABNORMAL LOW (ref 8.9–10.3)
GFR calc Af Amer: 52 mL/min — ABNORMAL LOW (ref >60)
GFR calc non Af Amer: 45 mL/min — ABNORMAL LOW (ref >60)
GLUCOSE: 163 mg/dL — AB (ref 65–99)
POTASSIUM: 4 mmol/L (ref 3.5–5.1)
Sodium: 138 mmol/L (ref 135–145)
TOTAL PROTEIN: 5.5 g/dL — AB (ref 6.5–8.1)

## 2015-09-05 LAB — I-STAT CG4 LACTIC ACID, ED
LACTIC ACID, VENOUS: 2.39 mmol/L — AB (ref 0.5–2.0)
Lactic Acid, Venous: 2.24 mmol/L (ref 0.5–2.0)

## 2015-09-05 LAB — CORTISOL: CORTISOL PLASMA: 4.4 ug/dL

## 2015-09-05 LAB — URINALYSIS, ROUTINE W REFLEX MICROSCOPIC
BILIRUBIN URINE: NEGATIVE
Glucose, UA: NEGATIVE mg/dL
HGB URINE DIPSTICK: NEGATIVE
KETONES UR: NEGATIVE mg/dL
Leukocytes, UA: NEGATIVE
Nitrite: NEGATIVE
PH: 5.5 (ref 5.0–8.0)
PROTEIN: NEGATIVE mg/dL
Specific Gravity, Urine: 1.006 (ref 1.005–1.030)
Urobilinogen, UA: 1 mg/dL (ref 0.0–1.0)

## 2015-09-05 LAB — I-STAT TROPONIN, ED: Troponin i, poc: 0.01 ng/mL (ref 0.00–0.08)

## 2015-09-05 LAB — MRSA PCR SCREENING: MRSA by PCR: NEGATIVE

## 2015-09-05 LAB — POC OCCULT BLOOD, ED: Fecal Occult Bld: POSITIVE — AB

## 2015-09-05 LAB — APTT: aPTT: 40 seconds — ABNORMAL HIGH (ref 24–37)

## 2015-09-05 MED ORDER — SODIUM CHLORIDE 0.9 % IV BOLUS (SEPSIS)
1000.0000 mL | Freq: Once | INTRAVENOUS | Status: AC
  Administered 2015-09-05: 1000 mL via INTRAVENOUS

## 2015-09-05 MED ORDER — PANTOPRAZOLE SODIUM 40 MG PO TBEC
40.0000 mg | DELAYED_RELEASE_TABLET | Freq: Every day | ORAL | Status: DC
  Administered 2015-09-05 – 2015-09-07 (×3): 40 mg via ORAL
  Filled 2015-09-05 (×3): qty 1

## 2015-09-05 MED ORDER — PROMETHAZINE HCL 25 MG PO TABS: 12.5000 mg | ORAL_TABLET | Freq: Four times a day (QID) | ORAL | Status: DC | PRN

## 2015-09-05 MED ORDER — VANCOMYCIN HCL 10 G IV SOLR
2000.0000 mg | Freq: Once | INTRAVENOUS | Status: AC
  Administered 2015-09-05: 2000 mg via INTRAVENOUS
  Filled 2015-09-05: qty 2000

## 2015-09-05 MED ORDER — RISPERIDONE 1 MG PO TABS
0.5000 mg | ORAL_TABLET | Freq: Two times a day (BID) | ORAL | Status: DC
  Administered 2015-09-05 – 2015-09-07 (×4): 0.5 mg via ORAL
  Filled 2015-09-05: qty 0.5
  Filled 2015-09-05 (×4): qty 1

## 2015-09-05 MED ORDER — ALPRAZOLAM 0.5 MG PO TABS
0.5000 mg | ORAL_TABLET | Freq: Two times a day (BID) | ORAL | Status: DC
  Administered 2015-09-05 – 2015-09-07 (×4): 0.5 mg via ORAL
  Filled 2015-09-05 (×4): qty 1

## 2015-09-05 MED ORDER — PIPERACILLIN-TAZOBACTAM 3.375 G IVPB
3.3750 g | Freq: Once | INTRAVENOUS | Status: AC
  Administered 2015-09-05: 3.375 g via INTRAVENOUS
  Filled 2015-09-05: qty 50

## 2015-09-05 MED ORDER — ACETAMINOPHEN 325 MG PO TABS
650.0000 mg | ORAL_TABLET | Freq: Four times a day (QID) | ORAL | Status: DC | PRN
  Administered 2015-09-07 (×2): 650 mg via ORAL
  Filled 2015-09-05: qty 2

## 2015-09-05 MED ORDER — PIPERACILLIN-TAZOBACTAM 3.375 G IVPB
3.3750 g | Freq: Three times a day (TID) | INTRAVENOUS | Status: DC
  Administered 2015-09-05 – 2015-09-06 (×2): 3.375 g via INTRAVENOUS
  Filled 2015-09-05 (×4): qty 50

## 2015-09-05 MED ORDER — VANCOMYCIN HCL 10 G IV SOLR
1250.0000 mg | INTRAVENOUS | Status: DC
  Filled 2015-09-05: qty 1250

## 2015-09-05 MED ORDER — SODIUM CHLORIDE 0.9 % IV SOLN
INTRAVENOUS | Status: DC
  Administered 2015-09-05: 150 mL/h via INTRAVENOUS

## 2015-09-05 MED ORDER — RIVAROXABAN 20 MG PO TABS
20.0000 mg | ORAL_TABLET | Freq: Every day | ORAL | Status: DC
  Administered 2015-09-06: 20 mg via ORAL
  Filled 2015-09-05: qty 1

## 2015-09-05 MED ORDER — GABAPENTIN 300 MG PO CAPS
300.0000 mg | ORAL_CAPSULE | Freq: Three times a day (TID) | ORAL | Status: DC
  Administered 2015-09-05 – 2015-09-07 (×6): 300 mg via ORAL
  Filled 2015-09-05 (×6): qty 1

## 2015-09-05 MED ORDER — LEVOTHYROXINE SODIUM 175 MCG PO TABS
175.0000 ug | ORAL_TABLET | Freq: Every day | ORAL | Status: DC
  Administered 2015-09-06 – 2015-09-07 (×2): 175 ug via ORAL
  Filled 2015-09-05 (×3): qty 1

## 2015-09-05 MED ORDER — ACETAMINOPHEN 650 MG RE SUPP: 650.0000 mg | Freq: Four times a day (QID) | RECTAL | Status: DC | PRN

## 2015-09-05 MED ORDER — ALUM & MAG HYDROXIDE-SIMETH 200-200-20 MG/5ML PO SUSP
30.0000 mL | Freq: Four times a day (QID) | ORAL | Status: DC | PRN
Start: 2015-09-05 — End: 2015-09-07

## 2015-09-05 MED ORDER — MORPHINE SULFATE (PF) 2 MG/ML IV SOLN
1.0000 mg | INTRAVENOUS | Status: DC | PRN
  Administered 2015-09-05: 1 mg via INTRAVENOUS
  Filled 2015-09-05: qty 1

## 2015-09-05 MED ORDER — SODIUM CHLORIDE 0.9 % IJ SOLN
3.0000 mL | Freq: Two times a day (BID) | INTRAMUSCULAR | Status: DC
  Administered 2015-09-06 – 2015-09-07 (×3): 3 mL via INTRAVENOUS

## 2015-09-05 MED ORDER — DULOXETINE HCL 60 MG PO CPEP
60.0000 mg | ORAL_CAPSULE | Freq: Every day | ORAL | Status: DC
  Administered 2015-09-06: 60 mg via ORAL
  Filled 2015-09-05: qty 1

## 2015-09-05 MED ORDER — SODIUM CHLORIDE 0.9 % IV SOLN
Freq: Once | INTRAVENOUS | Status: AC
  Administered 2015-09-05: 10 mL/h via INTRAVENOUS

## 2015-09-05 MED ORDER — NICOTINE 21 MG/24HR TD PT24
21.0000 mg | MEDICATED_PATCH | Freq: Every day | TRANSDERMAL | Status: DC
  Administered 2015-09-05 – 2015-09-07 (×3): 21 mg via TRANSDERMAL
  Filled 2015-09-05 (×3): qty 1

## 2015-09-05 NOTE — ED Provider Notes (Addendum)
CSN: [id number]     Arrival date & time 09/05/15  1244 History   First MD Initiated Contact with Patient 09/05/15 1251     Chief Complaint  Patient presents with  . Hypotension     (Consider location/radiation/quality/duration/timing/severity/associated sxs/prior Treatment) HPI..... Level V caveat for urgent need for intervention. Patient is lucid and alert. She has felt weak for approximately 3 days with some minimal diarrhea and nausea. She is hypotensive. EMS transport. No specific chest pain, dyspnea, dysuria, fever, sweats, chills. She normally is able to walk a minimal amount.  Past Medical History  Diagnosis Date  . Hypertension   . Hyperlipidemia   . DDD (degenerative disc disease)     With chronic pain  . Osteoporosis   . Prolonged Q-T interval on ECG 10/07/2013    Secondary to hypokalemia  . Hypokalemia 10/06/2013    Secondary to lasix  . Tobacco abuse 10/07/2013  . Hypoxia 10/07/2013    From mild resp depression from opioids  . Gallstone   . Diastolic dysfunction 84/6962  . Mitral valve prolapse 09/2013    Mild per echo  . Thyroid disease   . Psychosis   . Diabetes mellitus without complication    Past Surgical History  Procedure Laterality Date  . Thyroid surgery     Family History  Problem Relation Age of Onset  . Schizophrenia Maternal Grandfather    Social History  Substance Use Topics  . Smoking status: Current Every Day Smoker  . Smokeless tobacco: None  . Alcohol Use: No   OB History    No data available     Review of Systems  Unable to perform ROS: Acuity of condition      Allergies  Aspirin  Home Medications   Prior to Admission medications   Medication Sig Start Date End Date Taking? Authorizing Provider  ALPRAZolam Duanne Moron) 0.5 MG tablet Take 1 tablet (0.5 mg total) by mouth 3 (three) times daily. Patient taking differently: Take 0.5 mg by mouth 2 (two) times daily.  06/30/14  Yes Cloria Spring, MD  DULoxetine (CYMBALTA) 60 MG  capsule Take 60 mg by mouth daily before lunch.  06/08/15  Yes Historical Provider, MD  furosemide (LASIX) 40 MG tablet Take 40 mg by mouth 2 (two) times daily as needed for fluid.    Yes Historical Provider, MD  gabapentin (NEURONTIN) 300 MG capsule Take 300 mg by mouth 3 (three) times daily. 07/17/15  Yes Historical Provider, MD  levothyroxine (SYNTHROID, LEVOTHROID) 175 MCG tablet Take 175 mcg by mouth daily before breakfast.  09/28/13  Yes Historical Provider, MD  lisinopril (PRINIVIL,ZESTRIL) 5 MG tablet Take 5 mg by mouth daily.   Yes Historical Provider, MD  metoprolol tartrate (LOPRESSOR) 25 MG tablet Take 25 mg by mouth 2 (two) times daily.  08/24/13  Yes Historical Provider, MD  omeprazole (PRILOSEC) 40 MG capsule Take 40 mg by mouth daily before breakfast.  09/28/13  Yes Historical Provider, MD  potassium chloride (K-DUR) 10 MEQ tablet Take 10 mEq by mouth 3 (three) times daily.   Yes Historical Provider, MD  risperiDONE (RISPERDAL) 0.5 MG tablet Take one in the am and two at bedtime 06/30/14  Yes Cloria Spring, MD  risperiDONE (RISPERDAL) 0.5 MG tablet Take 0.5 mg by mouth 2 (two) times daily.   Yes Historical Provider, MD  rivaroxaban (XARELTO) 20 MG TABS tablet Take 20 mg by mouth daily with supper.   Yes Historical Provider, MD  Vitamin D, Ergocalciferol, (DRISDOL) 50000  UNITS CAPS capsule Take 1 capsule by mouth once a week. 07/17/15  Yes Historical Provider, MD  meloxicam (MOBIC) 7.5 MG tablet Take 1 tablet (7.5 mg total) by mouth daily. Patient not taking: Reported on 09/05/2015 04/21/14   Evalee Jefferson, PA-C  predniSONE (DELTASONE) 50 MG tablet Take 1 tablet (50 mg total) by mouth daily. 07/23/15   Kirstie Peri, MD  XARELTO STARTER PACK 15 & 20 MG TBPK Take 15-20 mg by mouth as directed. Take as directed on package: Start with one 15mg  tablet by mouth twice a day with food. On Day 22, switch to one 20mg  tablet once a day with food. 07/23/15   Kirstie Peri, MD   BP 92/40 mmHg  Pulse 57   Temp(Src) 97.8 F (36.6 C) (Oral)  Resp 17  Ht 5\' 1"  (1.549 m)  Wt 186 lb (84.369 kg)  BMI 35.16 kg/m2  SpO2 100% Physical Exam  Constitutional: She is oriented to person, place, and time.  Pale, alert, no acute distress  HENT:  Head: Normocephalic and atraumatic.  Eyes: Conjunctivae and EOM are normal. Pupils are equal, round, and reactive to light.  Neck: Normal range of motion. Neck supple.  Cardiovascular: Normal rate and regular rhythm.   Pulmonary/Chest: Effort normal and breath sounds normal.  Abdominal: Soft. Bowel sounds are normal.  Musculoskeletal: Normal range of motion.  Neurological: She is alert and oriented to person, place, and time.  Skin: Skin is warm and dry.  Psychiatric: She has a normal mood and affect. Her behavior is normal.  Nursing note and vitals reviewed.   ED Course  Procedures (including critical care time) Labs Review Labs Reviewed  COMPREHENSIVE METABOLIC PANEL - Abnormal; Notable for the following:    Glucose, Bld 163 (*)    BUN 5 (*)    Creatinine, Ser 1.20 (*)    Calcium 8.5 (*)    Total Protein 5.5 (*)    Albumin 2.7 (*)    GFR calc non Af Amer 45 (*)    GFR calc Af Amer 52 (*)    All other components within normal limits  CBC WITH DIFFERENTIAL/PLATELET - Abnormal; Notable for the following:    RBC 3.63 (*)    Hemoglobin 11.1 (*)    HCT 34.0 (*)    All other components within normal limits  I-STAT CG4 LACTIC ACID, ED - Abnormal; Notable for the following:    Lactic Acid, Venous 2.24 (*)    All other components within normal limits  I-STAT CG4 LACTIC ACID, ED - Abnormal; Notable for the following:    Lactic Acid, Venous 2.39 (*)    All other components within normal limits  URINE CULTURE  CULTURE, BLOOD (ROUTINE X 2)  CULTURE, BLOOD (ROUTINE X 2)  URINALYSIS, ROUTINE W REFLEX MICROSCOPIC (NOT AT [facility])  LIPASE, BLOOD  I-STAT TROPOININ, ED    Imaging Review Dg Chest Port 1 View  09/05/2015   CLINICAL DATA:  Cough and  hypotension  EXAM: PORTABLE CHEST - 1 VIEW  COMPARISON:  Chest radiograph and chest CT July 23, 2015  FINDINGS: There is slight atelectasis in the left base. Lungs elsewhere clear. Heart is upper normal in size with pulmonary vascularity within normal limits. No adenopathy. No bone lesions.  IMPRESSION: Slight left base atelectasis. Lungs otherwise clear. No change in cardiac silhouette.   Electronically Signed   By: Lowella Grip III M.D.   On: 09/05/2015 13:58   I have personally reviewed and evaluated these images and lab results  as part of my medical decision-making.   EKG Interpretation   Date/Time:  Tuesday September 05 2015 12:49:04 EDT Ventricular Rate:  51 PR Interval:  218 QRS Duration: 132 QT Interval:  555 QTC Calculation: 511 R Axis:   -50 Text Interpretation:  Pacemaker spikes or artifacts Sinus rhythm  Borderline prolonged PR interval Left bundle branch block Baseline wander  in lead(s) II III aVL aVF Confirmed by Lacinda Axon  MD, Corneluis Allston ([id number]) on  09/05/2015 12:52:23 PM Also confirmed by Lacinda Axon  MD, Yonatan Guitron ([id number])  on  09/05/2015 1:57:22 PM     CRITICAL CARE Performed by: Nat Christen  ?  Total critical care time: 30  Critical care time was exclusive of separately billable procedures and treating other patients.  Critical care was necessary to treat or prevent imminent or life-threatening deterioration.  Critical care was time spent personally by me on the following activities: development of treatment plan with patient and/or surrogate as well as nursing, discussions with consultants, evaluation of patient's response to treatment, examination of patient, obtaining history from patient or surrogate, ordering and performing treatments and interventions, ordering and review of laboratory studies, ordering and review of radiographic studies, pulse oximetry and re-evaluation of patient's condition. MDM   Final diagnoses:  Sepsis, due to unspecified organism    Code sepsis  initiated. Patient does not appear toxic. Blood pressure low, but pulse less than 60. Initial lactate 2.39. IV fluids, IV vancomycin, IV Zosyn. Admit.     Nat Christen, MD 09/05/15 [place], MD 09/05/15 [phone]

## 2015-09-05 NOTE — ED Notes (Signed)
From home via [facility], c/o 3 days of feeling poorly, diarrhea, no vomiting, was brady and hypotensive on EMS arrival with AMS, 500 NS enroute, BP 80 S on arrival, CBG 144

## 2015-09-05 NOTE — Progress Notes (Addendum)
ANTIBIOTIC CONSULT NOTE - INITIAL  Pharmacy Consult for vanc Indication: sepsis  Allergies  Allergen Reactions  . Aspirin Other (See Comments)    Burns line in stomach    Patient Measurements: Height: 5\' 1"  (154.9 cm) Weight: 186 lb (84.369 kg) IBW/kg (Calculated) : 47.8  Vital Signs: Temp: 97.9 F (36.6 C) (09/13 1246) Temp Source: Oral (09/13 1246) BP: 88/39 mmHg (09/13 1246) Pulse Rate: 51 (09/13 1246) Intake/Output from previous day:   Intake/Output from this shift:    Labs: No results for input(s): WBC, HGB, PLT, LABCREA, CREATININE in the last 72 hours. CrCl cannot be calculated (Patient has no serum creatinine result on file.). No results for input(s): VANCOTROUGH, VANCOPEAK, VANCORANDOM, GENTTROUGH, GENTPEAK, GENTRANDOM, TOBRATROUGH, TOBRAPEAK, TOBRARND, AMIKACINPEAK, AMIKACINTROU, AMIKACIN in the last 72 hours.   Microbiology: No results found for this or any previous visit (from the past 720 hour(s)).  Medical History: Past Medical History  Diagnosis Date  . Hypertension   . Hyperlipidemia   . DDD (degenerative disc disease)     With chronic pain  . Osteoporosis   . Prolonged Q-T interval on ECG 10/07/2013    Secondary to hypokalemia  . Hypokalemia 10/06/2013    Secondary to lasix  . Tobacco abuse 10/07/2013  . Hypoxia 10/07/2013    From mild resp depression from opioids  . Gallstone   . Diastolic dysfunction 25/4270  . Mitral valve prolapse 09/2013    Mild per echo  . Thyroid disease   . Psychosis   . Diabetes mellitus without complication     Assessment: 27 yof to ED feeling poorly, diarrhea, no vomiting, brady and hypotensive on arrival with AMS. Code Sepsis called. Pharmacy consulted to dose vanc for sepsis. Also started Zosyn. Afebrile, SCr 1.2 on admit, CrCl~43  9/13 vanc>> 9/13 zosyn>>  9/13 BCx2>> 9/13 UC>>  Goal of Therapy:  Vancomycin trough level 15-20 mcg/ml  Plan:  Vanc 2g IV x 1 dose; then vanc 1250mg  IV q24h Zosyn  3.375g IV q8h Mon clinical progress, c/s, renal function, abx plan  Elicia Lamp, PharmD Clinical Pharmacist Pager [phone] 09/05/2015 1:32 PM

## 2015-09-05 NOTE — H&P (Signed)
[facility] History and Physical                                                                                    Rhonda Santos, is a 69 y.o. female  MRN: [id number]   DOB - 09-18-1946  Admit Date - 09/05/2015  Outpatient Primary MD for the patient is [name], PA-C  Referring MD: Lacinda Axon / ER  With History of -  Past Medical History  Diagnosis Date  . Hypertension   . Hyperlipidemia   . DDD (degenerative disc disease)     With chronic pain  . Osteoporosis   . Prolonged Q-T interval on ECG 10/07/2013    Secondary to hypokalemia  . Hypokalemia 10/06/2013    Secondary to lasix  . Tobacco abuse 10/07/2013  . Hypoxia 10/07/2013    From mild resp depression from opioids  . Gallstone   . Diastolic dysfunction 46/5035  . Mitral valve prolapse 09/2013    Mild per echo  . Thyroid disease   . Psychosis   . Diabetes mellitus without complication       Past Surgical History  Procedure Laterality Date  . Thyroid surgery      in for   Chief Complaint  Patient presents with  . Hypotension     HPI This is a 69 year old female patient with past history of prior psychotic episode currently controlled on medications, hypertension, dyslipidemia, prolonged QT interval EKG, diabetes, left ventricular diastolic dysfunction grade 2. She was recently diagnosed with small subsegmental pulmonary embolus on 07/23/2015 and started on Xarelto. Patient presents with weakness for 3 days. Symptoms began as epigastric abdominal pain with associated poor oral intake and episodic nausea/ vomiting and diarrhea 1 occurrence. Patient continued to take her usual medications including her diuretic and antihypertensive medications. She was very weak this morning with attempts to mobilize and asked her son to evaluate her. They utilized a home blood pressure machine and found the patient's blood pressure was low so she was brought to the emergency department.  In the ER, patient's initial blood  pressure was 82/38 with an MAP of 48. Laboratory data revealed mild acute renal failure with a creatinine of 1.2 with baseline creatinine 0.94, lactate was elevated at 2.24. She was afebrile, chest x-ray was negative but concerns were for possible sepsis etiology so sepsis protocol was initiated. She was given 2 L of IV fluid and given broad-spectrum antibiotics, blood cultures were obtained, urinalysis and culture is yet to be collected, repeat lactic acid increased slightly to 2.38 but her blood pressure improved with most recent reading of 103/42. Patient's heart rate has been anywhere between 49 and 58 bpm but she was on beta blockers prior to admission. Other she was not hypoxemic presentation she was started on 2 L nasal cannula oxygen and has maintained saturations greater than 95%.  In further discussion the patient she admits to 1 episode of diarrhea this had persistent epigastric abdominal discomfort. She denies dark or bloody stools or dark or bloody emesis. She is not been on antibiotics recently. Confirms for several day she was sleeping was more dizzy today. She has also noticed dysuria and urinary  frequency.   Review of Systems   In addition to the HPI above,  No Fever-chills, myalgias or other constitutional symptoms No Headache, changes with Vision or hearing, new weakness, tingling, numbness in any extremity, No problems swallowing food or Liquids, indigestion/reflux No Chest pain, Cough or Shortness of Breath, palpitations, orthopnea or DOE No melena or hematochezia, no dark tarry stools No hematuria or flank pain No new skin rashes, lesions, masses or bruises, No new joints pains-aches No recent weight gain or loss No polyuria, polydypsia or polyphagia,  *A full 10 point Review of Systems was done, except as stated above, all other Review of Systems were negative.  Social History Social History  Substance Use Topics  . Smoking status: Current Every Day Smoker  . Smokeless  tobacco: Not on file  . Alcohol Use: No    Resides at: Private residence  Lives with: Alone but son lives close by  Ambulatory status: Typically without assistive devices   Family History Family History  Problem Relation Age of Onset  . Schizophrenia Maternal Grandfather      Prior to Admission medications   Medication Sig Start Date End Date Taking? Authorizing Provider  ALPRAZolam Duanne Moron) 0.5 MG tablet Take 1 tablet (0.5 mg total) by mouth 3 (three) times daily. Patient taking differently: Take 0.5 mg by mouth 2 (two) times daily.  06/30/14  Yes Cloria Spring, MD  DULoxetine (CYMBALTA) 60 MG capsule Take 60 mg by mouth daily before lunch.  06/08/15  Yes Historical Provider, MD  furosemide (LASIX) 40 MG tablet Take 40 mg by mouth 2 (two) times daily as needed for fluid.    Yes Historical Provider, MD  gabapentin (NEURONTIN) 300 MG capsule Take 300 mg by mouth 3 (three) times daily. 07/17/15  Yes Historical Provider, MD  levothyroxine (SYNTHROID, LEVOTHROID) 175 MCG tablet Take 175 mcg by mouth daily before breakfast.  09/28/13  Yes Historical Provider, MD  lisinopril (PRINIVIL,ZESTRIL) 5 MG tablet Take 5 mg by mouth daily.   Yes Historical Provider, MD  metoprolol tartrate (LOPRESSOR) 25 MG tablet Take 25 mg by mouth 2 (two) times daily.  08/24/13  Yes Historical Provider, MD  omeprazole (PRILOSEC) 40 MG capsule Take 40 mg by mouth daily before breakfast.  09/28/13  Yes Historical Provider, MD  potassium chloride (K-DUR) 10 MEQ tablet Take 10 mEq by mouth 3 (three) times daily.   Yes Historical Provider, MD  risperiDONE (RISPERDAL) 0.5 MG tablet Take one in the am and two at bedtime 06/30/14  Yes Cloria Spring, MD  risperiDONE (RISPERDAL) 0.5 MG tablet Take 0.5 mg by mouth 2 (two) times daily.   Yes Historical Provider, MD  rivaroxaban (XARELTO) 20 MG TABS tablet Take 20 mg by mouth daily with supper.   Yes Historical Provider, MD  Vitamin D, Ergocalciferol, (DRISDOL) 50000 UNITS CAPS  capsule Take 1 capsule by mouth once a week. 07/17/15  Yes Historical Provider, MD  meloxicam (MOBIC) 7.5 MG tablet Take 1 tablet (7.5 mg total) by mouth daily. Patient not taking: Reported on 09/05/2015 04/21/14   Evalee Jefferson, PA-C  predniSONE (DELTASONE) 50 MG tablet Take 1 tablet (50 mg total) by mouth daily. 07/23/15   Kirstie Peri, MD  XARELTO STARTER PACK 15 & 20 MG TBPK Take 15-20 mg by mouth as directed. Take as directed on package: Start with one 15mg  tablet by mouth twice a day with food. On Day 22, switch to one 20mg  tablet once a day with food. 07/23/15   Annie Main  Hanley Ben, MD    Allergies  Allergen Reactions  . Aspirin Other (See Comments)    Burns line in stomach    Physical Exam  Vitals  Blood pressure 103/42, pulse 58, temperature 97.8 F (36.6 C), temperature source Oral, resp. rate 18, height 5\' 1"  (1.549 m), weight 186 lb (84.369 kg), SpO2 100 %.   General:  In no acute distress, appears chronically ill  Psych:  Normal affect, Denies Suicidal or Homicidal ideations, Awake Alert, Oriented X 3. no apparent short term memory deficits; not actively hallucinating  Neuro:   No focal neurological deficits, CN II through XII intact, Strength 5/5 all 4 extremities, Sensation intact all 4 extremities.  ENT:  Ears and Eyes appear Normal, Conjunctivae clear, PER. Moist oral mucosa without erythema or exudates.  Neck:  Supple, No lymphadenopathy appreciated  Respiratory:  Symmetrical chest wall movement, Good air movement bilaterally, CTAB. Room Air  Cardiac:  RRR, No Murmurs, no LE edema noted, no JVD, No carotid bruits, peripheral pulses palpable at 2+  Abdomen:  Positive bowel sounds, Soft, tender over epigastrium and right lower quadrant without definitive guarding or rebounding, Non distended,  No masses appreciated, no obvious hepatosplenomegaly  Skin:  No Cyanosis, Normal Skin Turgor, No Skin Rash or Bruise.  Extremities: Symmetrical without obvious trauma or injury,  no  effusions.  Data Review  CBC  Recent Labs Lab 09/05/15 1310  WBC 8.3  HGB 11.1*  HCT 34.0*  PLT 294  MCV 93.7  MCH 30.6  MCHC 32.6  RDW 13.4  LYMPHSABS 2.6  MONOABS 0.6  EOSABS 0.2  BASOSABS 0.0    Chemistries   Recent Labs Lab 09/05/15 1310  NA 138  K 4.0  CL 106  CO2 26  GLUCOSE 163*  BUN 5*  CREATININE 1.20*  CALCIUM 8.5*  AST 35  ALT 22  ALKPHOS 112  BILITOT 0.6    estimated creatinine clearance is 43 mL/min (by C-G formula based on Cr of 1.2).  No results for input(s): TSH, T4TOTAL, T3FREE, THYROIDAB in the last 72 hours.  Invalid input(s): FREET3  Coagulation profile No results for input(s): INR, PROTIME in the last 168 hours.  No results for input(s): DDIMER in the last 72 hours.  Cardiac Enzymes No results for input(s): CKMB, TROPONINI, MYOGLOBIN in the last 168 hours.  Invalid input(s): CK  Invalid input(s): POCBNP  Urinalysis    Component Value Date/Time   COLORURINE YELLOW 04/27/2014 2211   APPEARANCEUR CLEAR 04/27/2014 2211   LABSPEC 1.015 04/27/2014 2211   PHURINE 7.0 04/27/2014 2211   GLUCOSEU NEGATIVE 04/27/2014 2211   HGBUR NEGATIVE 04/27/2014 2211   BILIRUBINUR NEGATIVE 04/27/2014 2211   KETONESUR NEGATIVE 04/27/2014 2211   PROTEINUR TRACE* 04/27/2014 2211   UROBILINOGEN 1.0 04/27/2014 2211   NITRITE NEGATIVE 04/27/2014 2211   LEUKOCYTESUR SMALL* 04/27/2014 2211    Imaging results:   Dg Chest Port 1 View  09/05/2015   CLINICAL DATA:  Cough and hypotension  EXAM: PORTABLE CHEST - 1 VIEW  COMPARISON:  Chest radiograph and chest CT July 23, 2015  FINDINGS: There is slight atelectasis in the left base. Lungs elsewhere clear. Heart is upper normal in size with pulmonary vascularity within normal limits. No adenopathy. No bone lesions.  IMPRESSION: Slight left base atelectasis. Lungs otherwise clear. No change in cardiac silhouette.   Electronically Signed   By: Lowella Grip III M.D.   On: 09/05/2015 13:58     EKG:  (Independently reviewed) sinus bradycardia ventricular rate of  51 these per minute and with prolonged PR interval, underlying left bundle branch block, prolonged QTC 511 ms, no ST segment or T-wave changes that would be concerning for acute ischemia   Assessment & Plan  Principal Problem:   Sepsis with hypotension/Elevated lactic acid level -Admit to stepdown -Etiology indeterminate time of admission but differential includes acute viral syndrome versus urinary tract infection (urinalysis pending) -Certainly patient could've had a viral gastroenteritis that led to dehydration which was worsened by ongoing use of antihypertensives and diuretics-as would also cause elevation lactic acid secondary to hypotension and hypoperfusion -Continue broad-spectrum antibiotics -Follow up on blood and urine cultures -Second lactate did trend up slightly so we'll cycle 2 more lactate 3 hours apart -Currently hemodynamically stable and did receive 2 L of fluid although was not weight-based -Continue IV fluid hydration at 150 mL per hour  Active Problems:   Abdominal pain, acute -Patient having epigastric as well as right lower quadrant abdominal pain without leukocytosis or fever and one episode of nonbloody and non-melanotic diarrhea -Follow-up on urinalysis initially in the event the symptoms are being caused by UTI -If urinalysis negative consider CT of abdomen -Does not have transaminitis and has normal total bilirubin as well as normal alkaline phosphatase a low index of suspicion symptoms are caused by cholecystitis/biliary in etiology -Lipase low-doubt pancreatitis    Acute renal failure superimposed on stage 3 chronic kidney disease -Seen secondary to recent gastroenteritis type symptoms worsened by concomitant use of diuretics and antihypertensives while dehydrated -Holding ACE inhibitor and diuretics -IV fluids as above -Anion gap is normal    Prolonged Q-T interval on ECG -Previously  etiology listed to be as secondary to hypokalemia but prolonged QT persists -Patient on chronic Risperdal and based on history at this juncture we will continue this medication -Repeat EKG in a.m. -Recommendations to keep magnesium greater than 2 and potassium greater than 4    Left ventricular diastolic dysfunction, NYHA class 2 -Currently compensated and without evidence of heart failure    History of pulmonary embolism/ July 23, 2015 -Continue Xarelto    Tobacco abuse -Admits to smoking 1 pack per day -Nicotine patch    Post-surgical hypothyroidism -Continue Synthroid    Psychotic disorder -Appears adequately managed with medical therapies -Continue Risperdal, Xanax, Cymbalta    Anemia -Hemoglobin lightly lower than baseline of around 13.9 and currently is at 11.1 -Given abdominal discomfort and recent start on anticoagulation will check stools for blood    DVT Prophylaxis: Xarelto  Family Communication:   No family at bedside  Code Status: Full code   Condition:  Stable  Discharge disposition: Anticipate discharge back to home in the next 48-72 hours pending resolution of acute renal failure and continued stability in blood pressure readings; also pending determination of etiology to sepsis physiology  Time spent in minutes : 60      ELLIS,ALLISON L. ANP on 09/05/2015 at 3:42 PM  Between 7am to 7pm - Pager - [phone]  After 7pm go to [email or web address] - password [email or web address]  And look for the night coverage person covering me after hours  [facility]

## 2015-09-06 ENCOUNTER — Inpatient Hospital Stay (HOSPITAL_COMMUNITY): Payer: Medicare PPO

## 2015-09-06 ENCOUNTER — Encounter (HOSPITAL_COMMUNITY): Payer: Self-pay | Admitting: Certified Registered Nurse Anesthetist

## 2015-09-06 DIAGNOSIS — R011 Cardiac murmur, unspecified: Secondary | ICD-10-CM

## 2015-09-06 LAB — COMPREHENSIVE METABOLIC PANEL
ALK PHOS: 101 U/L (ref 38–126)
ALT: 19 U/L (ref 14–54)
AST: 28 U/L (ref 15–41)
Albumin: 2.5 g/dL — ABNORMAL LOW (ref 3.5–5.0)
Anion gap: 4 — ABNORMAL LOW (ref 5–15)
BUN: <5 mg/dL — ABNORMAL LOW (ref 6–20)
CALCIUM: 8.1 mg/dL — AB (ref 8.9–10.3)
CO2: 26 mmol/L (ref 22–32)
CREATININE: 1.04 mg/dL — AB (ref 0.44–1.00)
Chloride: 111 mmol/L (ref 101–111)
GFR calc non Af Amer: 53 mL/min — ABNORMAL LOW (ref >60)
GLUCOSE: 129 mg/dL — AB (ref 65–99)
Potassium: 3.6 mmol/L (ref 3.5–5.1)
SODIUM: 141 mmol/L (ref 135–145)
Total Bilirubin: 0.7 mg/dL (ref 0.3–1.2)
Total Protein: 5.6 g/dL — ABNORMAL LOW (ref 6.5–8.1)

## 2015-09-06 LAB — CBC
HCT: 34.5 % — ABNORMAL LOW (ref 36.0–46.0)
Hemoglobin: 11.2 g/dL — ABNORMAL LOW (ref 12.0–15.0)
MCH: 30.6 pg (ref 26.0–34.0)
MCHC: 32.5 g/dL (ref 30.0–36.0)
MCV: 94.3 fL (ref 78.0–100.0)
PLATELETS: 262 10*3/uL (ref 150–400)
RBC: 3.66 MIL/uL — AB (ref 3.87–5.11)
RDW: 13.4 % (ref 11.5–15.5)
WBC: 7.2 10*3/uL (ref 4.0–10.5)

## 2015-09-06 LAB — MAGNESIUM: Magnesium: 1.7 mg/dL (ref 1.7–2.4)

## 2015-09-06 MED ORDER — INFLUENZA VAC SPLIT QUAD 0.5 ML IM SUSY
0.5000 mL | PREFILLED_SYRINGE | INTRAMUSCULAR | Status: AC
  Administered 2015-09-07: 0.5 mL via INTRAMUSCULAR
  Filled 2015-09-06: qty 0.5

## 2015-09-06 MED ORDER — METOPROLOL TARTRATE 25 MG PO TABS
25.0000 mg | ORAL_TABLET | Freq: Two times a day (BID) | ORAL | Status: DC
  Administered 2015-09-06 – 2015-09-07 (×3): 25 mg via ORAL
  Filled 2015-09-06 (×3): qty 1

## 2015-09-06 MED ORDER — CETYLPYRIDINIUM CHLORIDE 0.05 % MT LIQD
7.0000 mL | Freq: Two times a day (BID) | OROMUCOSAL | Status: DC
  Administered 2015-09-06 – 2015-09-07 (×2): 7 mL via OROMUCOSAL

## 2015-09-06 MED ORDER — IOHEXOL 300 MG/ML  SOLN
100.0000 mL | Freq: Once | INTRAMUSCULAR | Status: AC | PRN
  Administered 2015-09-06: 100 mL via INTRAVENOUS

## 2015-09-06 MED ORDER — PERFLUTREN LIPID MICROSPHERE
1.0000 mL | INTRAVENOUS | Status: AC | PRN
  Administered 2015-09-06: 2 mL via INTRAVENOUS
  Filled 2015-09-06: qty 10

## 2015-09-06 MED ORDER — MORPHINE SULFATE (PF) 2 MG/ML IV SOLN: 1.0000 mg | INTRAVENOUS | Status: DC | PRN

## 2015-09-06 MED ORDER — IOHEXOL 300 MG/ML  SOLN
25.0000 mL | INTRAMUSCULAR | Status: AC
  Administered 2015-09-06 (×2): 25 mL via ORAL

## 2015-09-06 NOTE — Progress Notes (Addendum)
[place] TEAM 1 - Stepdown/ICU TEAM PROGRESS NOTE  Rhonda Santos MWU:[id number] DOB: [birth year] DOA: 09/05/2015 PCP: [name], PA-C  Admit HPI / Brief Narrative: 70 year old female with history of hypertension, hyperlipidemia, tobacco abuse, Diastolic dysfunction, diabetes, who presented to the ED with generalized weakness 3 days, epigastric abdominal pain, nausea vomiting and one episode of diarrhea. In the ED patient was noted to have systolic blood pressure in the 80s with a map of 48. Patient was also noted to be in acute renal failure with a creatinine of 1.2 (baseline 0.94). Patient was noted to have elevated lactic acid level of 2.24. Chest x-ray was negative for acute infiltrates. Patient was placed on IV fluids and started on broad-spectrum antibiotics. Patient was pancultured.  HPI/Subjective: The patient is resting comfortably in bed.  She presently denies chest pain shortness of breath fevers chills nausea vomiting or abdominal pain.  She admits she has poor appetite at present.  Assessment/Plan:  SIRS v/s sepsis with hypotension / lactic acidosis Blood pressure normalized/now high - no revealing culture data thus far - afebrile with normal white blood cell count - urinalysis unremarkable - chest x-ray without evidence of infiltrate - in the absence of clinical findings to suggest infection I will discontinue antibiotics and monitor  Acute abdominal pain Patient complained of epigastric as well as right lower quadrant abdominal pain at presentation- CT abdomen and pelvis pending - exam unrevealing  Acute or chronic kidney disease stage III Likely secondary to prerenal azotemia - creatinine has essentially normalized  PE 07/23/2015 Continue xarelto  Normocytic anemia Check anemia panel and follow  Prolonged QT Monitor on telemetry  Systolic murmur Last TTE 7253 - repeat to evaluate murmur  Chronic diastolic congestive heart failure No evidence of significant  volume overload at the present time  Tobacco abuse Counseled on absolute need for smoking cessation  Hypothyroidism Continue home replacement regimen  Psychotic disorder not otherwise specified Continue usual home medical therapy  Code Status: FULL Family Communication: no family present at time of exam Disposition Plan: Transfer to telemetry bed - PT/OT - follow up results of CT abdomen/pelvis - monitor for fever without antibiotics  Consultants: None  Procedures: None  Antibiotics: Zosyn 9/13 > 9/14 Vancomycin 9/13 > 9/14  DVT prophylaxis: Xarelto  Objective: Blood pressure 157/43, pulse 73, temperature 97.1 F (36.2 C), temperature source Oral, resp. rate 22, height 5\' 1"  (1.549 m), weight 85.276 kg (188 lb), SpO2 97 %.  Intake/Output Summary (Last 24 hours) at 09/06/15 1044 Last data filed at 09/06/15 1000  Gross per 24 hour  Intake 6147.5 ml  Output   2350 ml  Net 3797.5 ml   Exam: General: No acute respiratory distress Lungs: Clear to auscultation bilaterally without wheezes or crackles Cardiovascular: Regular rate and rhythm without gallop or rub - 2/6 holosystolic murmur Abdomen: Nontender, overweight/protuberant, soft, bowel sounds positive, no rebound, no ascites, no appreciable mass Extremities: No significant cyanosis, clubbing, or edema bilateral lower extremities  Data Reviewed: Basic Metabolic Panel:  Recent Labs Lab 09/05/15 1310 09/06/15 0235  NA 138 141  K 4.0 3.6  CL 106 111  CO2 26 26  GLUCOSE 163* 129*  BUN 5* <5*  CREATININE 1.20* 1.04*  CALCIUM 8.5* 8.1*  MG  --  1.7    CBC:  Recent Labs Lab 09/05/15 1310 09/05/15 1849 09/06/15 0235  WBC 8.3 9.4 7.2  NEUTROABS 4.9 5.0  --   HGB 11.1* 11.8* 11.2*  HCT 34.0* 36.2 34.5*  MCV 93.7  94.0 94.3  PLT 294 292 262    Liver Function Tests:  Recent Labs Lab 09/05/15 1310 09/06/15 0235  AST 35 28  ALT 22 19  ALKPHOS 112 101  BILITOT 0.6 0.7  PROT 5.5* 5.6*  ALBUMIN  2.7* 2.5*   Coags:  Recent Labs Lab 09/05/15 1849  INR 1.97*    Recent Labs Lab 09/05/15 1849  APTT 40*    Recent Results (from the past 240 hour(s))  Urine culture     Status: None (Preliminary result)   Collection Time: 09/05/15  2:40 PM  Result Value Ref Range Status   Specimen Description URINE, CATHETERIZED  Final   Special Requests NONE  Final   Culture NO GROWTH < 24 HOURS  Final   Report Status PENDING  Incomplete  MRSA PCR Screening     Status: None   Collection Time: 09/05/15  5:14 PM  Result Value Ref Range Status   MRSA by PCR NEGATIVE NEGATIVE Final    Comment:        The GeneXpert MRSA Assay (FDA approved for NASAL specimens only), is one component of a comprehensive MRSA colonization surveillance program. It is not intended to diagnose MRSA infection nor to guide or monitor treatment for MRSA infections.      Studies:   Recent x-ray studies have been reviewed in detail by the Attending Physician  Scheduled Meds:  Scheduled Meds: . ALPRAZolam  0.5 mg Oral BID  . antiseptic oral rinse  7 mL Mouth Rinse BID  . DULoxetine  60 mg Oral QAC lunch  . gabapentin  300 mg Oral TID  . [START ON 09/07/2015] Influenza vac split quadrivalent PF  0.5 mL Intramuscular Tomorrow-1000  . levothyroxine  175 mcg Oral QAC breakfast  . nicotine  21 mg Transdermal Daily  . pantoprazole  40 mg Oral Daily  . piperacillin-tazobactam (ZOSYN)  IV  3.375 g Intravenous 3 times per day  . risperiDONE  0.5 mg Oral BID  . rivaroxaban  20 mg Oral Q supper  . sodium chloride  3 mL Intravenous Q12H  . vancomycin  1,250 mg Intravenous Q24H    Time spent on care of this patient: 35 mins   MCCLUNG,JEFFREY T , MD   [facility] Office  [phone] Pager - Text Page per Shea Evans as per below:  On-Call/Text Page:      [email or web address]      password [email or web address]  If 7PM-7AM, please contact night-coverage [email or web address] Password [email or web address] 09/06/2015, 10:44 AM   LOS: 1 day

## 2015-09-06 NOTE — Care Management Note (Signed)
Case Management Note  Patient Details  Name: Rhonda Santos MRN: [id number] Date of Birth: 1946/11/11  Subjective/Objective:         Adm w sepsis           Action/Plan:  lives w fam, pcp dr Rowan Blase  Expected Discharge Date:                  Expected Discharge Plan:  [place]  In-House Referral:     Discharge planning Services     Post Acute Care Choice:    Choice offered to:     DME Arranged:    DME Agency:     HH Arranged:    [place]:     Status of Service:     Medicare Important Message Given:    Date Medicare IM Given:    Medicare IM give by:    Date Additional Medicare IM Given:    Additional Medicare Important Message give by:     If discussed at [place] of Stay Meetings, dates discussed:    Additional Comments: ur review done  Lacretia Leigh, RN 09/06/2015, 10:39 AM

## 2015-09-06 NOTE — Progress Notes (Signed)
Report called to [place] [facility]. Pt and pt's family made aware of transfer. Will move pt and belongings shortly.

## 2015-09-06 NOTE — Progress Notes (Signed)
  Echocardiogram 2D Echocardiogram with Definity has been performed.  Rhonda Santos 09/06/2015, 4:42 PM

## 2015-09-06 NOTE — Evaluation (Signed)
Physical Therapy Evaluation Patient Details Name: Rhonda Santos MRN: [id number] DOB: [birth year] Today's Date: 09/06/2015   History of Present Illness  Patient is a 69 year old female with history of hypertension, hyperlipidemia, tobacco abuse, Diastolic dysfunction, diabetes, who presents to the ED with generalized weakness 3 days, epigastric abdominal pain, nausea vomiting and one episode of diarrhea.    Clinical Impression  Pt admitted with/for general weakness, abdominal pain, n/v/d.  Pt currently limited functionally due to the problems listed below.  (see problems list.)  Pt will benefit from PT to maximize function and safety to be able to get home safely with available assist of family.     Follow Up Recommendations Home health PT;Supervision - Intermittent    Equipment Recommendations  None recommended by PT    Recommendations for Other Services       Precautions / Restrictions Precautions Precautions: Fall      Mobility  Bed Mobility Overal bed mobility: Needs Assistance Bed Mobility: Supine to Sit;Sit to Supine     Supine to sit: Min guard (rail used) Sit to supine: Min guard   General bed mobility comments: got into bed well and repositioned without need to assist, but struggled up.  Transfers Overall transfer level: Needs assistance Equipment used: None Transfers: Sit to/from Stand Sit to Stand: Min guard            Ambulation/Gait Ambulation/Gait assistance: Min guard Ambulation Distance (Feet): 180 Feet Assistive device: None Gait Pattern/deviations: Step-through pattern;Decreased step length - right;Decreased step length - left Gait velocity: slow, but able to speed up to cue Gait velocity interpretation: Below normal speed for age/gender General Gait Details: short guarded steps  Stairs            Wheelchair Mobility    Modified Rankin (Stroke Patients Only)       Balance Overall balance assessment: Needs  assistance Sitting-balance support: No upper extremity supported Sitting balance-Leahy Scale: Fair     Standing balance support: No upper extremity supported Standing balance-Leahy Scale: Fair                               Pertinent Vitals/Pain Pain Assessment: No/denies pain    Home Living Family/patient expects to be discharged to:: Private residence Living Arrangements: Children;Other (Comment) (lives with a son who is on disability) Available Help at Discharge: Family;Other (Comment) (as needed) Type of Home: House Home Access: Stairs to enter;Ramped entrance   Entrance Stairs-Number of Steps: 2 Home Layout: One level Home Equipment: Walker - 2 wheels;Cane - single point;Bedside commode;Tub bench;Wheelchair - manual      Prior Function Level of Independence: Independent         Comments: pt reports she was not alsways steady     Hand Dominance        Extremity/Trunk Assessment               Lower Extremity Assessment: Overall WFL for tasks assessed (general proximal and truncal weakness)         Communication   Communication: No difficulties  Cognition Arousal/Alertness: Awake/alert Behavior During Therapy: WFL for tasks assessed/performed Overall Cognitive Status: Within Functional Limits for tasks assessed                      General Comments      Exercises        Assessment/Plan    PT Assessment Patient needs continued PT services  PT Diagnosis Generalized weakness;Other (comment) (decr. activity tolerance)   PT Problem List Decreased strength;Decreased activity tolerance;Decreased balance;Decreased mobility  PT Treatment Interventions Gait training;Stair training;Functional mobility training;Therapeutic activities;Balance training;Patient/family education   PT Goals (Current goals can be found in the Care Plan section) Acute Rehab PT Goals Patient Stated Goal: back to being independent PT Goal Formulation: With  patient Time For Goal Achievement: 09/13/15 Potential to Achieve Goals: Good    Frequency Min 3X/week   Barriers to discharge        Co-evaluation               End of Session   Activity Tolerance: Patient tolerated treatment well Patient left: in bed;with call bell/phone within reach Nurse Communication: Mobility status         Time: 1510-1530 PT Time Calculation (min) (ACUTE ONLY): 20 min   Charges:   PT Evaluation $Initial PT Evaluation Tier I: 1 Procedure     PT G Codes:        Slayter Moorhouse, Tessie Fass 09/06/2015, 3:46 PM 09/06/2015  Donnella Sham, [place] [phone]  (pager)

## 2015-09-07 DIAGNOSIS — I4581 Long QT syndrome: Secondary | ICD-10-CM

## 2015-09-07 DIAGNOSIS — Z720 Tobacco use: Secondary | ICD-10-CM

## 2015-09-07 DIAGNOSIS — N179 Acute kidney failure, unspecified: Secondary | ICD-10-CM

## 2015-09-07 DIAGNOSIS — E872 Acidosis: Secondary | ICD-10-CM

## 2015-09-07 DIAGNOSIS — D509 Iron deficiency anemia, unspecified: Secondary | ICD-10-CM | POA: Diagnosis not present

## 2015-09-07 DIAGNOSIS — N183 Chronic kidney disease, stage 3 (moderate): Secondary | ICD-10-CM

## 2015-09-07 DIAGNOSIS — E1122 Type 2 diabetes mellitus with diabetic chronic kidney disease: Secondary | ICD-10-CM | POA: Diagnosis not present

## 2015-09-07 DIAGNOSIS — I5032 Chronic diastolic (congestive) heart failure: Secondary | ICD-10-CM | POA: Diagnosis not present

## 2015-09-07 DIAGNOSIS — E890 Postprocedural hypothyroidism: Secondary | ICD-10-CM

## 2015-09-07 DIAGNOSIS — F17210 Nicotine dependence, cigarettes, uncomplicated: Secondary | ICD-10-CM | POA: Diagnosis not present

## 2015-09-07 DIAGNOSIS — I959 Hypotension, unspecified: Secondary | ICD-10-CM | POA: Diagnosis not present

## 2015-09-07 DIAGNOSIS — R109 Unspecified abdominal pain: Secondary | ICD-10-CM | POA: Insufficient documentation

## 2015-09-07 DIAGNOSIS — I129 Hypertensive chronic kidney disease with stage 1 through stage 4 chronic kidney disease, or unspecified chronic kidney disease: Secondary | ICD-10-CM | POA: Diagnosis not present

## 2015-09-07 DIAGNOSIS — R100 Acute abdomen: Secondary | ICD-10-CM

## 2015-09-07 DIAGNOSIS — Z86711 Personal history of pulmonary embolism: Secondary | ICD-10-CM

## 2015-09-07 LAB — CBC
HCT: 33.1 % — ABNORMAL LOW (ref 36.0–46.0)
Hemoglobin: 11 g/dL — ABNORMAL LOW (ref 12.0–15.0)
MCH: 30.8 pg (ref 26.0–34.0)
MCHC: 33.2 g/dL (ref 30.0–36.0)
MCV: 92.7 fL (ref 78.0–100.0)
PLATELETS: 281 10*3/uL (ref 150–400)
RBC: 3.57 MIL/uL — AB (ref 3.87–5.11)
RDW: 13.4 % (ref 11.5–15.5)
WBC: 7.4 10*3/uL (ref 4.0–10.5)

## 2015-09-07 LAB — BASIC METABOLIC PANEL
ANION GAP: 6 (ref 5–15)
BUN: <5 mg/dL — ABNORMAL LOW (ref 6–20)
CALCIUM: 8.3 mg/dL — AB (ref 8.9–10.3)
CHLORIDE: 110 mmol/L (ref 101–111)
CO2: 25 mmol/L (ref 22–32)
CREATININE: 0.86 mg/dL (ref 0.44–1.00)
GFR calc non Af Amer: >60 mL/min (ref >60)
Glucose, Bld: 147 mg/dL — ABNORMAL HIGH (ref 65–99)
Potassium: 3.3 mmol/L — ABNORMAL LOW (ref 3.5–5.1)
SODIUM: 141 mmol/L (ref 135–145)

## 2015-09-07 LAB — RETICULOCYTES
RBC.: 3.57 MIL/uL — ABNORMAL LOW (ref 3.87–5.11)
Retic Count, Absolute: 60.7 10*3/uL (ref 19.0–186.0)
Retic Ct Pct: 1.7 % (ref 0.4–3.1)

## 2015-09-07 LAB — FERRITIN: Ferritin: 27 ng/mL (ref 11–307)

## 2015-09-07 LAB — URINE CULTURE: CULTURE: NO GROWTH

## 2015-09-07 LAB — VITAMIN B12: VITAMIN B 12: 393 pg/mL (ref 180–914)

## 2015-09-07 LAB — IRON AND TIBC
IRON: 31 ug/dL (ref 28–170)
Saturation Ratios: 11 % (ref 10.4–31.8)
TIBC: 279 ug/dL (ref 250–450)
UIBC: 248 ug/dL

## 2015-09-07 LAB — FOLATE: Folate: 11.5 ng/mL (ref >5.9)

## 2015-09-07 MED ORDER — AMLODIPINE BESYLATE 2.5 MG PO TABS: 2.5000 mg | ORAL_TABLET | Freq: Every day | ORAL | Status: DC

## 2015-09-07 NOTE — Discharge Summary (Addendum)
Pt got discharged to home, Discharge instructions provided and patient showed understanding to it, pt left the unit in wheelchair with all of her belongings in a stable condition,

## 2015-09-07 NOTE — Progress Notes (Signed)
CM talked to patient about home health care, patient refused [place] at this time. She states that she is active as much as possible, no DME needed. Mindi Slicker [facility] [phone]

## 2015-09-07 NOTE — Discharge Summary (Signed)
Physician Discharge Summary  NEYAH ELLERMAN RCV:[id number] DOB: July 29, 1946 DOA: 09/05/2015  PCP: [name], PA-C  Admit date: 09/05/2015 Discharge date: 09/07/2015  Recommendations for Outpatient Follow-up:  1. Pt will need to follow up with PCP in 2 weeks post discharge 2. Please obtain BMP in one week  Discharge Diagnoses:  SIRS v/s sepsis with hypotension / lactic acidosis Blood pressure normalized/now high - no revealing culture data- afebrile with normal white blood cell count - urinalysis unremarkable - chest x-ray without evidence of infiltrate - in the absence of clinical findings to suggest infection I will discontinue antibiotics and monitor -Blood cultures improved -Lactic acid improved with fluid resuscitation -Blood pressure improved with fluid resuscitation; therefore, the patient's presentation may have been one of dehydration -procalcitonin <0.10  Acute abdominal pain Patient complained of epigastric as well as right lower quadrant abdominal pain at presentation-  -09/06/2015 CT abdomen and pelvis suggested liver cirrhosis without any other acute findings -LFTs and lipase normal  Acute or chronic kidney disease stage III Likely secondary to prerenal azotemia - creatinine has essentially normalized -Serum creatinine peaked to 1.20 -Baseline creatinine 0.7-0.9 -The patient may resume her when necessary dosing of furosemide as needed for edema -Given the patient's acute on chronic renal failure, will not restart lisinopril at this time  PE 07/23/2015 Continue xarelto  Normocytic anemia/anemia of CKD/iron deficiency anemia -B12--393 -Iron saturation 11%; ferritin 27 -Reticulocyte 10.2%  -folic acid 58.5 -Start iron supplementation  Prolonged QT Monitor on telemetry  Systolic murmur Last TTE 2778 - repeat to evaluate murmur -09/06/2015 echocardiogram EF 55-60%, PEEP 38, trivial AI, no WMA  Chronic diastolic congestive heart failure No evidence of  significant volume overload at the present time  Tobacco abuse Counseled on absolute need for smoking cessation  Hypothyroidism Continue home replacement regimen  Psychotic disorder not otherwise specified Continue usual home medical therapy  Hemoccult positive -Although the patient had a positive fecal occult blood test, she did not have any frank hematochezia or melena -[name] hemoglobin remained stable throughout the hospitalization -Patient will need to follow up with her primary care provider for further evaluation and close monitoring Hyperglycemia -The patient already has a scheduled appointment for follow-up with her primary care provider -She is aware for hyperglycemia and possible start of oral hypoglycemic agents by her primary care provider  Discharge Condition: stable  Disposition: home  Diet:heart healthy  Wt Readings from Last 3 Encounters:  09/07/15 85.367 kg (188 lb 3.2 oz)  06/30/14 70.761 kg (156 lb)  04/27/14 65.772 kg (145 lb)    History of present illness:  69 year old female with history of hypertension, hyperlipidemia, tobacco abuse, Diastolic dysfunction, diabetes, who presented to the ED with generalized weakness 3 days, epigastric abdominal pain, nausea vomiting and one episode of diarrhea. In the ED patient was noted to have systolic blood pressure in the 80s with a map of 48. Patient was also noted to be in acute renal failure with a creatinine of 1.2 (baseline 0.94). Patient was noted to have elevated lactic acid level of 2.24. Chest x-ray was negative for acute infiltrates. Patient was placed on IV fluids and started on broad-spectrum antibiotics. Patient was pancultured. Urinalysis did not reveal any pyuria. Blood cultures remained negative. The patient the patient remained stable and clinically improved.  Although the patient was started on antibiotics these were discontinued. The patient was monitored for 24 hours off of antibiotics. She  remained clinically stable. [name] blood pressure improved with fluid resuscitation. Discharge Exam:  Filed Vitals:   09/07/15 1031  BP: 160/70  Pulse: 76  Temp:   Resp:    Filed Vitals:   09/07/15 0525 09/07/15 0848 09/07/15 0856 09/07/15 1031  BP: 176/48  160/70 160/70  Pulse: 78   76  Temp: 98.1 F (36.7 C)     TempSrc: Oral     Resp: 18     Height:      Weight: 85.367 kg (188 lb 3.2 oz)     SpO2: 97% 96%     General: A&O x 3, NAD, pleasant, cooperative Cardiovascular: RRR, no rub, no gallop, no S3 Respiratory: CTAB, no wheeze, no rhonchi Abdomen:soft, nontender, nondistended, positive bowel sounds Extremities: No edema, No lymphangitis, no petechiae  Discharge Instructions      Discharge Instructions    Diet - low sodium heart healthy    Complete by:  As directed      Increase activity slowly    Complete by:  As directed             Medication List    STOP taking these medications        lisinopril 5 MG tablet  Commonly known as:  PRINIVIL,ZESTRIL     meloxicam 7.5 MG tablet  Commonly known as:  MOBIC     predniSONE 50 MG tablet  Commonly known as:  DELTASONE      TAKE these medications        ALPRAZolam 0.5 MG tablet  Commonly known as:  XANAX  Take 1 tablet (0.5 mg total) by mouth 3 (three) times daily.     amLODipine 2.5 MG tablet  Commonly known as:  NORVASC  Take 1 tablet (2.5 mg total) by mouth daily.     DULoxetine 60 MG capsule  Commonly known as:  CYMBALTA  Take 60 mg by mouth daily before lunch.     furosemide 40 MG tablet  Commonly known as:  LASIX  Take 40 mg by mouth 2 (two) times daily as needed for fluid.     gabapentin 300 MG capsule  Commonly known as:  NEURONTIN  Take 300 mg by mouth 3 (three) times daily.     levothyroxine 175 MCG tablet  Commonly known as:  SYNTHROID, LEVOTHROID  Take 175 mcg by mouth daily before breakfast.     metoprolol tartrate 25 MG tablet  Commonly known as:  LOPRESSOR  Take 25 mg by  mouth 2 (two) times daily.     omeprazole 40 MG capsule  Commonly known as:  PRILOSEC  Take 40 mg by mouth daily before breakfast.     potassium chloride 10 MEQ tablet  Commonly known as:  K-DUR  Take 10 mEq by mouth 3 (three) times daily.     risperiDONE 0.5 MG tablet  Commonly known as:  RISPERDAL  Take 0.5 mg by mouth 2 (two) times daily.     Vitamin D (Ergocalciferol) 50000 UNITS Caps capsule  Commonly known as:  DRISDOL  Take 1 capsule by mouth once a week.     rivaroxaban 20 MG Tabs tablet  Commonly known as:  XARELTO  Take 20 mg by mouth daily with supper.     XARELTO STARTER PACK 15 & 20 MG Tbpk  Generic drug:  Rivaroxaban  Take 15-20 mg by mouth as directed. Take as directed on package: Start with one 15mg  tablet by mouth twice a day with food. On Day 22, switch to one 20mg  tablet once a day with food.  The results of significant diagnostics from this hospitalization (including imaging, microbiology, ancillary and laboratory) are listed below for reference.    Significant Diagnostic Studies: Ct Abdomen Pelvis W Contrast  09/06/2015   CLINICAL DATA:  Abdominal periumbilical pain for 1 week.  PMH: DM, HTN, Hyperlipidemia; DDD (degenerative disc disease); Osteoporosis; Prolonged Q-T interval on ECG; Hypokalemia; Tobacco abuse; Hypoxia; Gallstone; Diastolic dysfunction; Mitral valve prolapse; Thyroid disease; Psychosis  EXAM: CT ABDOMEN AND PELVIS WITH CONTRAST  TECHNIQUE: Multidetector CT imaging of the abdomen and pelvis was performed using the standard protocol following bolus administration of intravenous contrast.  CONTRAST:  124mL OMNIPAQUE IOHEXOL 300 MG/ML  SOLN  COMPARISON:  None.  FINDINGS: Lung bases: Minimal dependent subsegmental atelectasis. Heart normal in size.  Liver: There is some central volume loss and relative enlargement of the lateral segment of the left lobe, as well as surface nodularity. No liver mass or focal lesion.  Gallbladder and biliary  tree: Single dependent gallstone. No acute cholecystitis. No bile duct dilation.  Spleen, pancreas, adrenal glands:  Unremarkable.  Kidneys, ureters, bladder:  Unremarkable.  Uterus and adnexa:  Unremarkable.  Vascular: Dense atherosclerotic calcifications noted along a normal caliber abdominal aorta and iliac arteries.  Lymph nodes: Prominent porta table of nodes, largest measuring 11 mm short axis. No other adenopathy.  Ascites: None.  Gastrointestinal: Appendix is prominent measuring 7 mm in diameter, but contains contrast and air and shows no adjacent inflammation. Colon, small bowel and stomach are unremarkable. Musculoskeletal cul mild degenerative changes of the visualized spine with arthropathic changes of the hips. No osteoblastic or osteolytic lesions.  IMPRESSION: 1. No acute findings in the abdomen pelvis. 2. Morphologic changes in the liver consistent with cirrhosis. 3. Gallstone.   Electronically Signed   By: Lajean Manes M.D.   On: 09/06/2015 13:16   Dg Chest Port 1 View  09/05/2015   CLINICAL DATA:  Cough and hypotension  EXAM: PORTABLE CHEST - 1 VIEW  COMPARISON:  Chest radiograph and chest CT July 23, 2015  FINDINGS: There is slight atelectasis in the left base. Lungs elsewhere clear. Heart is upper normal in size with pulmonary vascularity within normal limits. No adenopathy. No bone lesions.  IMPRESSION: Slight left base atelectasis. Lungs otherwise clear. No change in cardiac silhouette.   Electronically Signed   By: Lowella Grip III M.D.   On: 09/05/2015 13:58     Microbiology: Recent Results (from the past 240 hour(s))  Blood Culture (routine x 2)     Status: None (Preliminary result)   Collection Time: 09/05/15  1:10 PM  Result Value Ref Range Status   Specimen Description BLOOD LEFT HAND  Final   Special Requests BOTTLES DRAWN AEROBIC AND ANAEROBIC 5CC  Final   Culture NO GROWTH 1 DAY  Final   Report Status PENDING  Incomplete  Blood Culture (routine x 2)     Status:  None (Preliminary result)   Collection Time: 09/05/15  1:23 PM  Result Value Ref Range Status   Specimen Description BLOOD LEFT ARM  Final   Special Requests BOTTLES DRAWN AEROBIC AND ANAEROBIC 10CC  Final   Culture NO GROWTH 1 DAY  Final   Report Status PENDING  Incomplete  Urine culture     Status: None   Collection Time: 09/05/15  2:40 PM  Result Value Ref Range Status   Specimen Description URINE, CATHETERIZED  Final   Special Requests NONE  Final   Culture NO GROWTH 2 DAYS  Final   Report  Status 09/07/2015 FINAL  Final  MRSA PCR Screening     Status: None   Collection Time: 09/05/15  5:14 PM  Result Value Ref Range Status   MRSA by PCR NEGATIVE NEGATIVE Final    Comment:        The GeneXpert MRSA Assay (FDA approved for NASAL specimens only), is one component of a comprehensive MRSA colonization surveillance program. It is not intended to diagnose MRSA infection nor to guide or monitor treatment for MRSA infections.      Labs: Basic Metabolic Panel:  Recent Labs Lab 09/05/15 1310 09/06/15 0235 09/07/15 0417  NA 138 141 141  K 4.0 3.6 3.3*  CL 106 111 110  CO2 26 26 25   GLUCOSE 163* 129* 147*  BUN 5* <5* <5*  CREATININE 1.20* 1.04* 0.86  CALCIUM 8.5* 8.1* 8.3*  MG  --  1.7  --    Liver Function Tests:  Recent Labs Lab 09/05/15 1310 09/06/15 0235  AST 35 28  ALT 22 19  ALKPHOS 112 101  BILITOT 0.6 0.7  PROT 5.5* 5.6*  ALBUMIN 2.7* 2.5*   No results for input(s): LIPASE, AMYLASE in the last 168 hours. No results for input(s): AMMONIA in the last 168 hours. CBC:  Recent Labs Lab 09/05/15 1310 09/05/15 1849 09/06/15 0235 09/07/15 0417  WBC 8.3 9.4 7.2 7.4  NEUTROABS 4.9 5.0  --   --   HGB 11.1* 11.8* 11.2* 11.0*  HCT 34.0* 36.2 34.5* 33.1*  MCV 93.7 94.0 94.3 92.7  PLT 294 292 262 281   Cardiac Enzymes: No results for input(s): CKTOTAL, CKMB, CKMBINDEX, TROPONINI in the last 168 hours. BNP: Invalid input(s): POCBNP CBG: No results  for input(s): GLUCAP in the last 168 hours.  Time coordinating discharge:  Greater than 30 minutes  Signed:  Daegen Berrocal, DO [facility] Pager: [phone] 09/07/2015, 11:58 AM

## 2015-09-07 NOTE — Progress Notes (Signed)
Physical Therapy Treatment/ Discharge Patient Details Name: LONDAN COPLEN MRN: [id number] DOB: [birth year] Today's Date: Sep 29, 2015    History of Present Illness Patient is a 69 year old female with history of hypertension, hyperlipidemia, tobacco abuse, Diastolic dysfunction, diabetes, who presents to the ED with generalized weakness 3 days, epigastric abdominal pain, nausea vomiting and one episode of diarrhea.      PT Comments    Pt with excellent improvement from evaluation, able to walk 600' and perform stairs. Pt reports chronic bil knee pain secondary to arthritis but mobility has returned to baseline. Pt educated to continued mobility acutely and at home which she states understanding. No further therapy needed at this time with pt aware and agreeable. Will sign off.   Follow Up Recommendations  No PT follow up     Equipment Recommendations  None recommended by PT    Recommendations for Other Services       Precautions / Restrictions Precautions Precautions: Fall    Mobility  Bed Mobility Overal bed mobility: Modified Independent                Transfers Overall transfer level: Modified independent                  Ambulation/Gait Ambulation/Gait assistance: Modified independent (Device/Increase time) Ambulation Distance (Feet): 600 Feet Assistive device: None Gait Pattern/deviations: Step-through pattern;Decreased stride length   Gait velocity interpretation: at or above normal speed for age/gender     Stairs Stairs: Yes Stairs assistance: Modified independent (Device/Increase time) Stair Management: One rail Right;Step to pattern;Backwards Number of Stairs: 3    Wheelchair Mobility    Modified Rankin (Stroke Patients Only)       Balance Overall balance assessment: Needs assistance   Sitting balance-Leahy Scale: Good       Standing balance-Leahy Scale: Good                      Cognition Arousal/Alertness:  Awake/alert Behavior During Therapy: WFL for tasks assessed/performed Overall Cognitive Status: Within Functional Limits for tasks assessed                      Exercises      General Comments        Pertinent Vitals/Pain Pain Assessment: No/denies pain    Home Living                      Prior Function            PT Goals (current goals can now be found in the care plan section) Progress towards PT goals: Goals met/education completed, patient discharged from PT    Frequency       PT Plan Discharge plan needs to be updated    Co-evaluation             End of Session   Activity Tolerance: Patient tolerated treatment well Patient left: in bed;with call bell/phone within reach     Time: 4982-6415 PT Time Calculation (min) (ACUTE ONLY): 17 min  Charges:  $Gait Training: 8-22 mins                    G Codes:      Melford Aase 2015-09-29, 12:36 PM Elwyn Reach, [place]

## 2015-09-10 LAB — CULTURE, BLOOD (ROUTINE X 2)
CULTURE: NO GROWTH
CULTURE: NO GROWTH

## 2015-09-13 DIAGNOSIS — Z6832 Body mass index (BMI) 32.0-32.9, adult: Secondary | ICD-10-CM | POA: Diagnosis not present

## 2015-09-13 DIAGNOSIS — N189 Chronic kidney disease, unspecified: Secondary | ICD-10-CM | POA: Diagnosis not present

## 2015-09-13 DIAGNOSIS — I9589 Other hypotension: Secondary | ICD-10-CM | POA: Diagnosis not present

## 2015-09-13 DIAGNOSIS — R109 Unspecified abdominal pain: Secondary | ICD-10-CM | POA: Diagnosis not present

## 2015-09-13 DIAGNOSIS — Z1389 Encounter for screening for other disorder: Secondary | ICD-10-CM | POA: Diagnosis not present

## 2015-09-13 DIAGNOSIS — E6609 Other obesity due to excess calories: Secondary | ICD-10-CM | POA: Diagnosis not present

## 2015-10-04 IMAGING — CR DG CHEST 1V PORT
1 series · 1 of 1 positions shown · non-contrast
Comparison: 04/27/2014

CLINICAL DATA: Cough, shortness of breath and wheezing for 3 days.

EXAM:
PORTABLE CHEST - 1 VIEW

[AP]
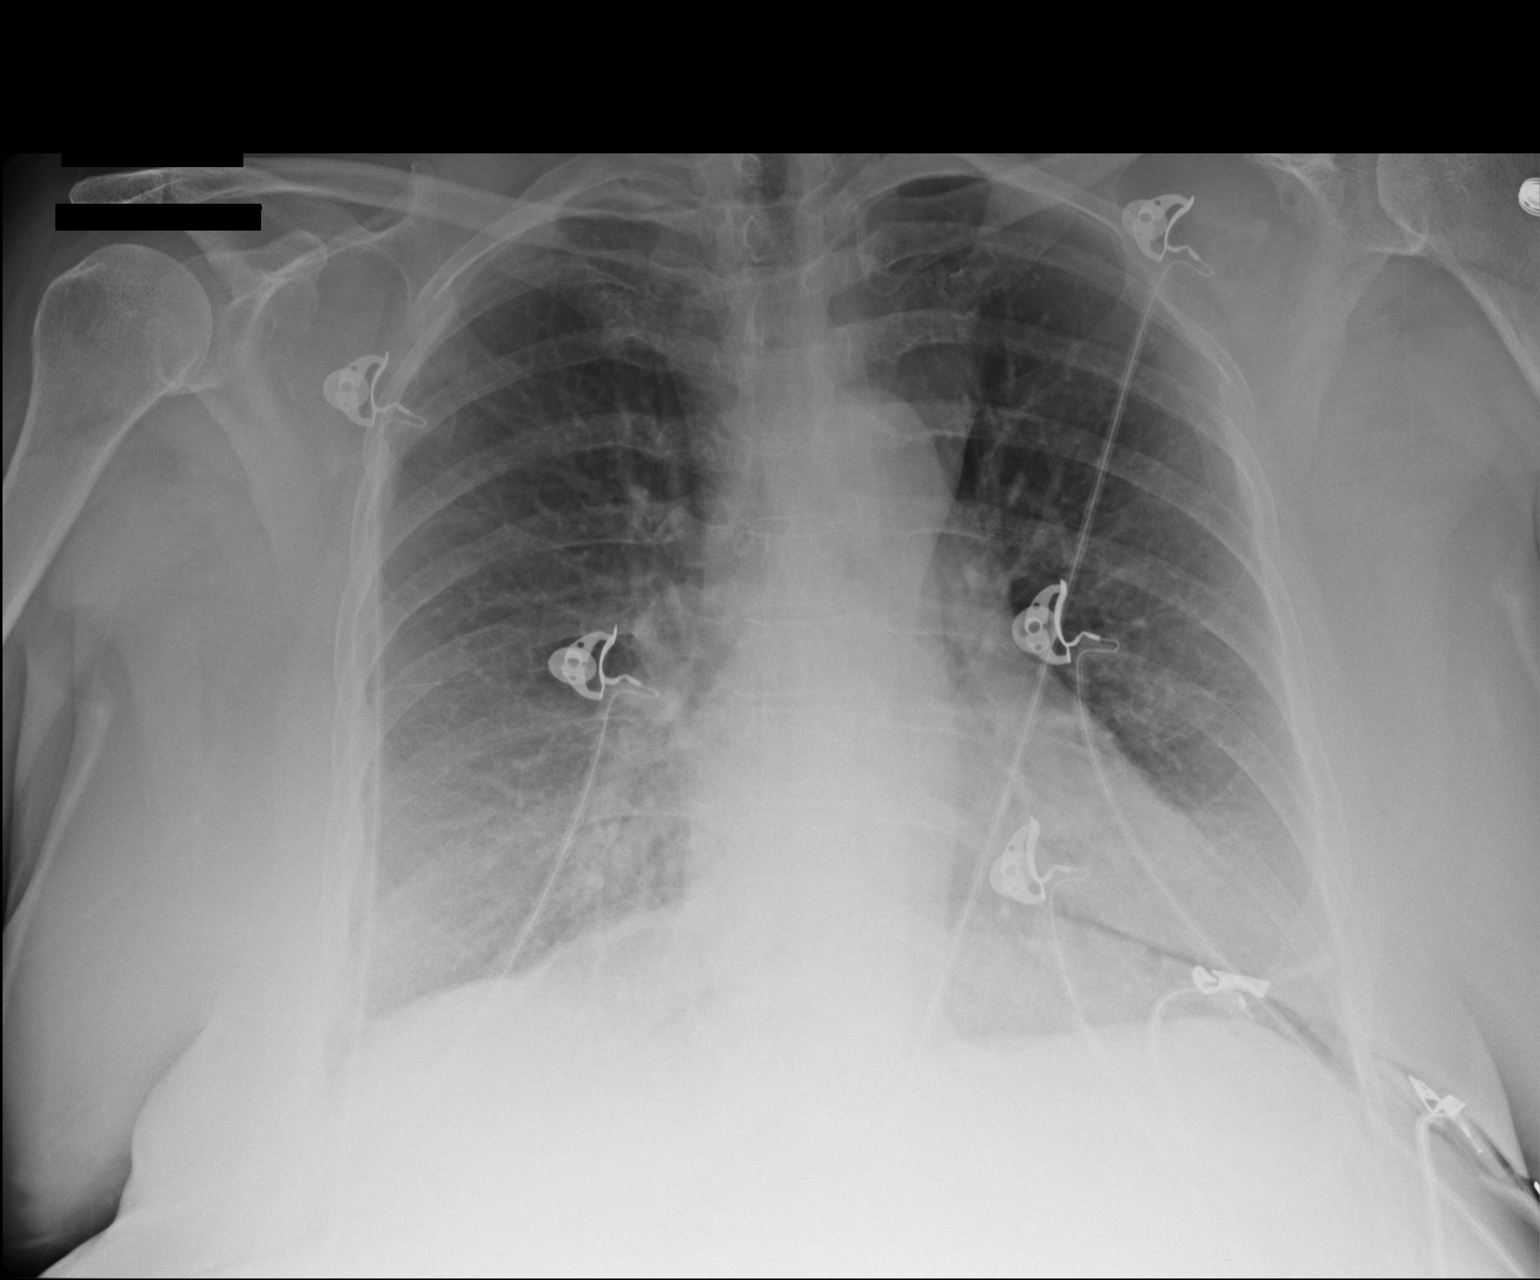

[1 of 1 positions shown; findings below may reference images not displayed]

FINDINGS: The cardiomediastinal contours are unchanged, heart at the upper
limits of normal in size. There is bronchial thickening. No
consolidation, pleural effusion, or pneumothorax. No acute osseous
abnormalities are seen.
IMPRESSION: Bronchial thickening suggestive of bronchitis.  No consolidation.

## 2015-11-09 DIAGNOSIS — M1710 Unilateral primary osteoarthritis, unspecified knee: Secondary | ICD-10-CM | POA: Diagnosis not present

## 2015-11-09 DIAGNOSIS — Z79899 Other long term (current) drug therapy: Secondary | ICD-10-CM | POA: Diagnosis not present

## 2015-11-09 DIAGNOSIS — M545 Low back pain: Secondary | ICD-10-CM | POA: Diagnosis not present

## 2015-11-09 DIAGNOSIS — I2782 Chronic pulmonary embolism: Secondary | ICD-10-CM | POA: Diagnosis not present

## 2015-11-09 DIAGNOSIS — M797 Fibromyalgia: Secondary | ICD-10-CM | POA: Diagnosis not present

## 2015-11-17 IMAGING — DX DG CHEST 1V PORT
1 series · 1 of 1 positions shown · non-contrast
Comparison: Chest radiograph and chest CT July 23, 2015

CLINICAL DATA: Cough and hypotension

EXAM:
PORTABLE CHEST - 1 VIEW

[chest ap]
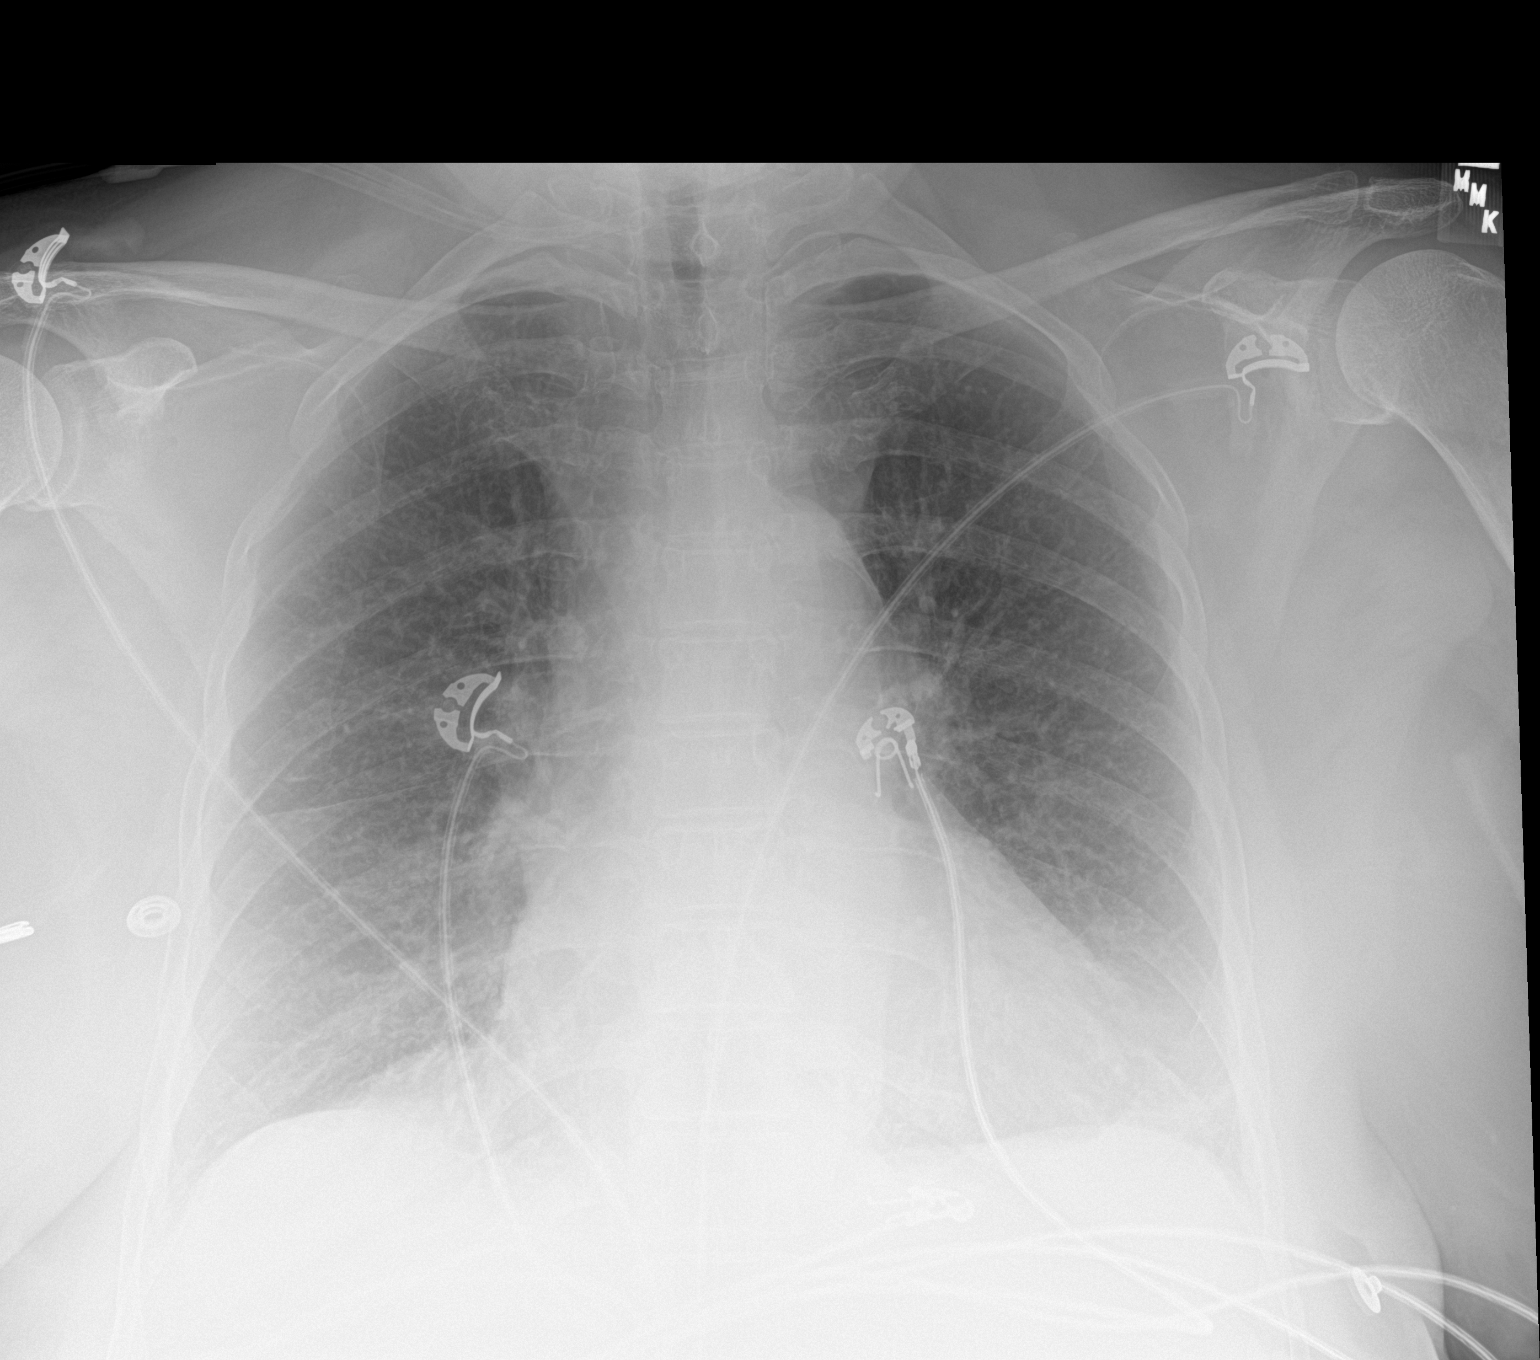

[1 of 1 positions shown; findings below may reference images not displayed]

FINDINGS: There is slight atelectasis in the left base. Lungs elsewhere clear.
Heart is upper normal in size with pulmonary vascularity within
normal limits. No adenopathy. No bone lesions.
IMPRESSION: Slight left base atelectasis. Lungs otherwise clear. No change in
cardiac silhouette.

## 2015-12-22 DIAGNOSIS — M5416 Radiculopathy, lumbar region: Secondary | ICD-10-CM | POA: Diagnosis not present

## 2015-12-25 ENCOUNTER — Inpatient Hospital Stay (HOSPITAL_COMMUNITY)
Admission: EM | Admit: 2015-12-25 | Discharge: 2015-12-27 | DRG: 190 | Disposition: A | Discharge status: Home / Self Care | Payer: Medicare Other | Attending: Internal Medicine | Admitting: Internal Medicine

## 2015-12-25 ENCOUNTER — Emergency Department (HOSPITAL_COMMUNITY): Payer: Medicare Other

## 2015-12-25 ENCOUNTER — Encounter (HOSPITAL_COMMUNITY): Payer: Self-pay | Admitting: *Deleted

## 2015-12-25 DIAGNOSIS — J9601 Acute respiratory failure with hypoxia: Secondary | ICD-10-CM

## 2015-12-25 DIAGNOSIS — T380X5A Adverse effect of glucocorticoids and synthetic analogues, initial encounter: Secondary | ICD-10-CM | POA: Diagnosis present

## 2015-12-25 DIAGNOSIS — K219 Gastro-esophageal reflux disease without esophagitis: Secondary | ICD-10-CM | POA: Diagnosis present

## 2015-12-25 DIAGNOSIS — R9431 Abnormal electrocardiogram [ECG] [EKG]: Secondary | ICD-10-CM

## 2015-12-25 DIAGNOSIS — F29 Unspecified psychosis not due to a substance or known physiological condition: Secondary | ICD-10-CM

## 2015-12-25 DIAGNOSIS — Z818 Family history of other mental and behavioral disorders: Secondary | ICD-10-CM

## 2015-12-25 DIAGNOSIS — E119 Type 2 diabetes mellitus without complications: Secondary | ICD-10-CM | POA: Insufficient documentation

## 2015-12-25 DIAGNOSIS — E785 Hyperlipidemia, unspecified: Secondary | ICD-10-CM | POA: Diagnosis present

## 2015-12-25 DIAGNOSIS — E118 Type 2 diabetes mellitus with unspecified complications: Secondary | ICD-10-CM

## 2015-12-25 DIAGNOSIS — E1165 Type 2 diabetes mellitus with hyperglycemia: Secondary | ICD-10-CM

## 2015-12-25 DIAGNOSIS — I5189 Other ill-defined heart diseases: Secondary | ICD-10-CM

## 2015-12-25 DIAGNOSIS — Z7901 Long term (current) use of anticoagulants: Secondary | ICD-10-CM

## 2015-12-25 DIAGNOSIS — Z86711 Personal history of pulmonary embolism: Secondary | ICD-10-CM

## 2015-12-25 DIAGNOSIS — R531 Weakness: Secondary | ICD-10-CM

## 2015-12-25 DIAGNOSIS — E876 Hypokalemia: Secondary | ICD-10-CM

## 2015-12-25 DIAGNOSIS — M810 Age-related osteoporosis without current pathological fracture: Secondary | ICD-10-CM | POA: Diagnosis present

## 2015-12-25 DIAGNOSIS — I519 Heart disease, unspecified: Secondary | ICD-10-CM

## 2015-12-25 DIAGNOSIS — R0602 Shortness of breath: Secondary | ICD-10-CM | POA: Diagnosis present

## 2015-12-25 DIAGNOSIS — I959 Hypotension, unspecified: Secondary | ICD-10-CM

## 2015-12-25 DIAGNOSIS — I10 Essential (primary) hypertension: Secondary | ICD-10-CM | POA: Diagnosis present

## 2015-12-25 DIAGNOSIS — E873 Alkalosis: Secondary | ICD-10-CM

## 2015-12-25 DIAGNOSIS — N183 Chronic kidney disease, stage 3 unspecified: Secondary | ICD-10-CM

## 2015-12-25 DIAGNOSIS — F17210 Nicotine dependence, cigarettes, uncomplicated: Secondary | ICD-10-CM | POA: Diagnosis present

## 2015-12-25 DIAGNOSIS — A419 Sepsis, unspecified organism: Secondary | ICD-10-CM

## 2015-12-25 DIAGNOSIS — N179 Acute kidney failure, unspecified: Secondary | ICD-10-CM

## 2015-12-25 DIAGNOSIS — E890 Postprocedural hypothyroidism: Secondary | ICD-10-CM

## 2015-12-25 DIAGNOSIS — J440 Chronic obstructive pulmonary disease with acute lower respiratory infection: Secondary | ICD-10-CM | POA: Diagnosis present

## 2015-12-25 DIAGNOSIS — R7401 Elevation of levels of liver transaminase levels: Secondary | ICD-10-CM

## 2015-12-25 DIAGNOSIS — E039 Hypothyroidism, unspecified: Secondary | ICD-10-CM | POA: Diagnosis present

## 2015-12-25 DIAGNOSIS — J441 Chronic obstructive pulmonary disease with (acute) exacerbation: Principal | ICD-10-CM

## 2015-12-25 DIAGNOSIS — R740 Nonspecific elevation of levels of transaminase and lactic acid dehydrogenase [LDH]: Secondary | ICD-10-CM

## 2015-12-25 DIAGNOSIS — R109 Unspecified abdominal pain: Secondary | ICD-10-CM

## 2015-12-25 DIAGNOSIS — J209 Acute bronchitis, unspecified: Secondary | ICD-10-CM | POA: Diagnosis present

## 2015-12-25 DIAGNOSIS — I159 Secondary hypertension, unspecified: Secondary | ICD-10-CM

## 2015-12-25 DIAGNOSIS — Z720 Tobacco use: Secondary | ICD-10-CM

## 2015-12-25 DIAGNOSIS — R7989 Other specified abnormal findings of blood chemistry: Secondary | ICD-10-CM

## 2015-12-25 DIAGNOSIS — F419 Anxiety disorder, unspecified: Secondary | ICD-10-CM | POA: Diagnosis present

## 2015-12-25 HISTORY — DX: Other pulmonary embolism without acute cor pulmonale: I26.99

## 2015-12-25 LAB — GLUCOSE, CAPILLARY
GLUCOSE-CAPILLARY: 390 mg/dL — AB (ref 65–99)
Glucose-Capillary: 251 mg/dL — ABNORMAL HIGH (ref 65–99)

## 2015-12-25 LAB — CBC
HCT: 38.8 % (ref 36.0–46.0)
HEMOGLOBIN: 12.8 g/dL (ref 12.0–15.0)
MCH: 30.8 pg (ref 26.0–34.0)
MCHC: 33 g/dL (ref 30.0–36.0)
MCV: 93.5 fL (ref 78.0–100.0)
PLATELETS: 226 10*3/uL (ref 150–400)
RBC: 4.15 MIL/uL (ref 3.87–5.11)
RDW: 14.1 % (ref 11.5–15.5)
WBC: 8.9 10*3/uL (ref 4.0–10.5)

## 2015-12-25 LAB — BASIC METABOLIC PANEL
ANION GAP: 7 (ref 5–15)
BUN: 9 mg/dL (ref 6–20)
CALCIUM: 8.9 mg/dL (ref 8.9–10.3)
CO2: 31 mmol/L (ref 22–32)
CREATININE: 0.85 mg/dL (ref 0.44–1.00)
Chloride: 108 mmol/L (ref 101–111)
Glucose, Bld: 149 mg/dL — ABNORMAL HIGH (ref 65–99)
Potassium: 4.2 mmol/L (ref 3.5–5.1)
Sodium: 146 mmol/L — ABNORMAL HIGH (ref 135–145)

## 2015-12-25 LAB — BRAIN NATRIURETIC PEPTIDE: B NATRIURETIC PEPTIDE 5: 131 pg/mL — AB (ref 0.0–100.0)

## 2015-12-25 LAB — TROPONIN I

## 2015-12-25 MED ORDER — AMLODIPINE BESYLATE 5 MG PO TABS
2.5000 mg | ORAL_TABLET | Freq: Every day | ORAL | Status: DC
  Administered 2015-12-25: 2.5 mg via ORAL
  Filled 2015-12-25: qty 1

## 2015-12-25 MED ORDER — IPRATROPIUM BROMIDE 0.02 % IN SOLN
RESPIRATORY_TRACT | Status: AC
Start: 2015-12-25 — End: 2015-12-25
  Filled 2015-12-25: qty 2.5

## 2015-12-25 MED ORDER — METHYLPREDNISOLONE SODIUM SUCC 125 MG IJ SOLR: 125.0000 mg | Freq: Once | INTRAMUSCULAR | Status: DC

## 2015-12-25 MED ORDER — RIVAROXABAN 20 MG PO TABS
20.0000 mg | ORAL_TABLET | Freq: Every day | ORAL | Status: DC
  Administered 2015-12-25 – 2015-12-26 (×2): 20 mg via ORAL
  Filled 2015-12-25 (×2): qty 1

## 2015-12-25 MED ORDER — SODIUM CHLORIDE 0.9 % IV SOLN: 250.0000 mL | INTRAVENOUS | Status: DC | PRN

## 2015-12-25 MED ORDER — ACETAMINOPHEN 325 MG PO TABS: 650.0000 mg | ORAL_TABLET | Freq: Four times a day (QID) | ORAL | Status: DC | PRN

## 2015-12-25 MED ORDER — IPRATROPIUM BROMIDE 0.02 % IN SOLN
0.5000 mg | Freq: Once | RESPIRATORY_TRACT | Status: AC
  Administered 2015-12-25: 0.5 mg via RESPIRATORY_TRACT

## 2015-12-25 MED ORDER — HYDRALAZINE HCL 20 MG/ML IJ SOLN: 10.0000 mg | Freq: Four times a day (QID) | INTRAMUSCULAR | Status: DC | PRN

## 2015-12-25 MED ORDER — NICOTINE 21 MG/24HR TD PT24
21.0000 mg | MEDICATED_PATCH | Freq: Every day | TRANSDERMAL | Status: DC
  Administered 2015-12-25 – 2015-12-27 (×3): 21 mg via TRANSDERMAL
  Filled 2015-12-25 (×3): qty 1

## 2015-12-25 MED ORDER — ALBUTEROL SULFATE (2.5 MG/3ML) 0.083% IN NEBU: 2.5000 mg | INHALATION_SOLUTION | RESPIRATORY_TRACT | Status: DC | PRN

## 2015-12-25 MED ORDER — SODIUM CHLORIDE 0.9 % IJ SOLN
3.0000 mL | INTRAMUSCULAR | Status: DC | PRN
  Administered 2015-12-27: 3 mL via INTRAVENOUS
  Filled 2015-12-25: qty 3

## 2015-12-25 MED ORDER — DULOXETINE HCL 60 MG PO CPEP
60.0000 mg | ORAL_CAPSULE | Freq: Every day | ORAL | Status: DC
Start: 2015-12-26 — End: 2015-12-27
  Administered 2015-12-25 – 2015-12-27 (×3): 60 mg via ORAL
  Filled 2015-12-25 (×3): qty 1

## 2015-12-25 MED ORDER — IPRATROPIUM-ALBUTEROL 0.5-2.5 (3) MG/3ML IN SOLN
3.0000 mL | Freq: Four times a day (QID) | RESPIRATORY_TRACT | Status: DC
  Administered 2015-12-25 – 2015-12-27 (×8): 3 mL via RESPIRATORY_TRACT
  Filled 2015-12-25 (×9): qty 3

## 2015-12-25 MED ORDER — LEVOTHYROXINE SODIUM 75 MCG PO TABS
175.0000 ug | ORAL_TABLET | Freq: Every day | ORAL | Status: DC
  Administered 2015-12-25 – 2015-12-27 (×3): 175 ug via ORAL
  Filled 2015-12-25 (×3): qty 1

## 2015-12-25 MED ORDER — SODIUM CHLORIDE 0.9 % IJ SOLN
3.0000 mL | Freq: Two times a day (BID) | INTRAMUSCULAR | Status: DC
  Administered 2015-12-25 – 2015-12-27 (×5): 3 mL via INTRAVENOUS

## 2015-12-25 MED ORDER — ALBUTEROL (5 MG/ML) CONTINUOUS INHALATION SOLN
10.0000 mg/h | INHALATION_SOLUTION | Freq: Once | RESPIRATORY_TRACT | Status: AC
  Administered 2015-12-25: 10 mg/h via RESPIRATORY_TRACT
  Filled 2015-12-25: qty 20

## 2015-12-25 MED ORDER — ALPRAZOLAM 0.5 MG PO TABS
0.5000 mg | ORAL_TABLET | Freq: Two times a day (BID) | ORAL | Status: DC
  Administered 2015-12-25 – 2015-12-27 (×5): 0.5 mg via ORAL
  Filled 2015-12-25 (×4): qty 1

## 2015-12-25 MED ORDER — INSULIN ASPART 100 UNIT/ML ~~LOC~~ SOLN
0.0000 [IU] | Freq: Every day | SUBCUTANEOUS | Status: DC
  Administered 2015-12-25: 3 [IU] via SUBCUTANEOUS

## 2015-12-25 MED ORDER — INSULIN ASPART 100 UNIT/ML ~~LOC~~ SOLN
0.0000 [IU] | Freq: Three times a day (TID) | SUBCUTANEOUS | Status: DC
  Administered 2015-12-25: 15 [IU] via SUBCUTANEOUS
  Administered 2015-12-26: 5 [IU] via SUBCUTANEOUS
  Administered 2015-12-26: 3 [IU] via SUBCUTANEOUS
  Administered 2015-12-26: 11 [IU] via SUBCUTANEOUS
  Administered 2015-12-27: 5 [IU] via SUBCUTANEOUS
  Administered 2015-12-27: 8 [IU] via SUBCUTANEOUS

## 2015-12-25 MED ORDER — PANTOPRAZOLE SODIUM 40 MG PO TBEC
40.0000 mg | DELAYED_RELEASE_TABLET | Freq: Every day | ORAL | Status: DC
  Administered 2015-12-25 – 2015-12-27 (×3): 40 mg via ORAL
  Filled 2015-12-25 (×3): qty 1

## 2015-12-25 MED ORDER — METOPROLOL TARTRATE 25 MG PO TABS
25.0000 mg | ORAL_TABLET | Freq: Two times a day (BID) | ORAL | Status: DC
  Administered 2015-12-25 – 2015-12-27 (×5): 25 mg via ORAL
  Filled 2015-12-25 (×5): qty 1

## 2015-12-25 MED ORDER — ONDANSETRON HCL 4 MG/2ML IJ SOLN: 4.0000 mg | Freq: Four times a day (QID) | INTRAMUSCULAR | Status: DC | PRN

## 2015-12-25 MED ORDER — METHYLPREDNISOLONE SODIUM SUCC 125 MG IJ SOLR
60.0000 mg | Freq: Four times a day (QID) | INTRAMUSCULAR | Status: DC
Start: 2015-12-25 — End: 2015-12-27
  Administered 2015-12-25 – 2015-12-27 (×8): 60 mg via INTRAVENOUS
  Filled 2015-12-25 (×8): qty 2

## 2015-12-25 MED ORDER — CETYLPYRIDINIUM CHLORIDE 0.05 % MT LIQD
7.0000 mL | Freq: Two times a day (BID) | OROMUCOSAL | Status: DC
  Administered 2015-12-25 – 2015-12-27 (×3): 7 mL via OROMUCOSAL

## 2015-12-25 MED ORDER — GABAPENTIN 300 MG PO CAPS
300.0000 mg | ORAL_CAPSULE | Freq: Three times a day (TID) | ORAL | Status: DC
  Administered 2015-12-25 – 2015-12-27 (×6): 300 mg via ORAL
  Filled 2015-12-25: qty 1
  Filled 2015-12-25: qty 3
  Filled 2015-12-25 (×4): qty 1

## 2015-12-25 MED ORDER — RISPERIDONE 0.5 MG PO TABS
0.5000 mg | ORAL_TABLET | Freq: Two times a day (BID) | ORAL | Status: DC
  Administered 2015-12-25 – 2015-12-27 (×5): 0.5 mg via ORAL
  Filled 2015-12-25 (×5): qty 1

## 2015-12-25 MED ORDER — ACETAMINOPHEN 650 MG RE SUPP: 650.0000 mg | Freq: Four times a day (QID) | RECTAL | Status: DC | PRN

## 2015-12-25 MED ORDER — ONDANSETRON HCL 4 MG PO TABS: 4.0000 mg | ORAL_TABLET | Freq: Four times a day (QID) | ORAL | Status: DC | PRN

## 2015-12-25 MED ORDER — DOXYCYCLINE HYCLATE 100 MG PO TABS
100.0000 mg | ORAL_TABLET | Freq: Two times a day (BID) | ORAL | Status: DC
  Administered 2015-12-25 – 2015-12-27 (×5): 100 mg via ORAL
  Filled 2015-12-25 (×5): qty 1

## 2015-12-25 MED ORDER — GUAIFENESIN ER 600 MG PO TB12
1200.0000 mg | ORAL_TABLET | Freq: Two times a day (BID) | ORAL | Status: DC
  Administered 2015-12-25 – 2015-12-27 (×5): 1200 mg via ORAL
  Filled 2015-12-25 (×5): qty 2

## 2015-12-25 MED ORDER — IPRATROPIUM-ALBUTEROL 0.5-2.5 (3) MG/3ML IN SOLN: 3.0000 mL | Freq: Once | RESPIRATORY_TRACT | Status: DC

## 2015-12-25 NOTE — ED Notes (Signed)
Pt ambulated to restroom with no difficulty, returning from restroom she states "short of breath" O2-86%

## 2015-12-25 NOTE — ED Notes (Signed)
Ordered pt a tray at 1030 which has not arrived. Pt received a snack prior to transport to room.

## 2015-12-25 NOTE — H&P (Signed)
[facility] History and Physical  Rhonda Santos [id number] DOB: 07/29/1946 DOA: 12/25/2015  Referring physician: Dorie Rank, MD PCP: [name], PA-C   Chief Complaint: Shortness of breath   HPI: Rhonda Santos is a 70 y.o. female with past medical history of DM type 2, COPD with tobacco use presented with complaints of increasing shortness of breath onset 2 days ago. She also reports productive cough with white sputum and wheezing. She increased her fluids and used OTC medications with no relief.  Admits dry mouth and congestion. Denies fever, BLE edema. Also denies knowledge of COPD diagnosis and use of breathing medications. Has been smoking since 86. She has tried quitting with use of Chantix and no success. Since her SOB was progressively getting worse she presented to ER for evaluation. Upon arrival to the ER she was noted to be short of breath with bilateral wheezes. She was given a dose of Solumedrol and an hour long neb treatment. She initially felt her breathing had improved but upon ambulation became short of breath and hypoxic with O2 saturations in the 80's. She will be admitted for further treatments.    Review of Systems:  Constitutional:  No weight loss, night sweats, Fevers, chills, fatigue.  HEENT:  No headaches, Difficulty swallowing,Tooth/dental problems,Sore throat,  No sneezing, itching, ear ache, nasal congestion, post nasal drip,  Cardio-vascular:  No chest pain, Orthopnea, PND, swelling in lower extremities, anasarca, dizziness, palpitations  GI:  No heartburn, indigestion, abdominal pain, nausea, vomiting, diarrhea, change in bowel habits, loss of appetite  Resp:   No excess mucus, no productive cough, No non-productive cough, No coughing up of blood.No change in color of mucus.No wheezing.No chest wall deformity  Positive: Shortness of breath with exertion or at rest, wheezing. Skin:  no rash or lesions.  GU:  no dysuria, change in color of urine,  no urgency or frequency. No flank pain.  Musculoskeletal:  No joint pain or swelling. No decreased range of motion. No back pain.  Psych:  No change in mood or affect. No depression or anxiety. No memory loss.   Past Medical History  Diagnosis Date  . Hypertension   . Hyperlipidemia   . DDD (degenerative disc disease)     With chronic pain  . Osteoporosis   . Prolonged Q-T interval on ECG 10/07/2013    Secondary to hypokalemia  . Hypokalemia 10/06/2013    Secondary to lasix  . Tobacco abuse 10/07/2013  . Hypoxia 10/07/2013    From mild resp depression from opioids  . Gallstone   . Diastolic dysfunction 99991111  . Mitral valve prolapse 09/2013    Mild per echo  . Thyroid disease   . Psychosis   . Diabetes mellitus without complication ([place])   . Pulmonary embolism ([place])     dx 2016   Past Surgical History  Procedure Laterality Date  . Thyroid surgery     Social History:  reports that she has been smoking Cigarettes.  She does not have any smokeless tobacco history on file. She reports that she does not drink alcohol or use illicit drugs.  Allergies  Allergen Reactions  . Aspirin Other (See Comments)    Burns line in stomach    Family History  Problem Relation Age of Onset  . Schizophrenia Maternal Grandfather      Prior to Admission medications   Medication Sig Start Date End Date Taking? Authorizing Provider  ALPRAZolam Duanne Moron) 0.5 MG tablet Take 1 tablet (0.5 mg total) by  mouth 3 (three) times daily. Patient taking differently: Take 0.5 mg by mouth 2 (two) times daily.  06/30/14  Yes Cloria Spring, MD  amLODipine (NORVASC) 2.5 MG tablet Take 1 tablet (2.5 mg total) by mouth daily. 09/07/15  Yes Orson Eva, MD  DULoxetine (CYMBALTA) 60 MG capsule Take 60 mg by mouth daily before lunch.  06/08/15  Yes Historical Provider, MD  furosemide (LASIX) 40 MG tablet Take 40 mg by mouth 3 (three) times daily as needed for fluid.    Yes Historical Provider, MD  gabapentin  (NEURONTIN) 300 MG capsule Take 300 mg by mouth 3 (three) times daily. 07/17/15  Yes Historical Provider, MD  levothyroxine (SYNTHROID, LEVOTHROID) 175 MCG tablet Take 175 mcg by mouth daily before breakfast.  09/28/13  Yes Historical Provider, MD  metoprolol tartrate (LOPRESSOR) 25 MG tablet Take 25 mg by mouth 2 (two) times daily.  08/24/13  Yes Historical Provider, MD  omeprazole (PRILOSEC) 40 MG capsule Take 40 mg by mouth daily before breakfast.  09/28/13  Yes Historical Provider, MD  potassium chloride (K-DUR) 10 MEQ tablet Take 10 mEq by mouth 3 (three) times daily.   Yes Historical Provider, MD  risperiDONE (RISPERDAL) 0.5 MG tablet Take 0.5 mg by mouth 2 (two) times daily.   Yes Historical Provider, MD  rivaroxaban (XARELTO) 20 MG TABS tablet Take 20 mg by mouth daily with supper.   Yes Historical Provider, MD  Vitamin D, Ergocalciferol, (DRISDOL) 50000 UNITS CAPS capsule Take 1 capsule by mouth once a week. 07/17/15  Yes Historical Provider, MD   Physical Exam: Filed Vitals:   12/25/15 1030 12/25/15 1100 12/25/15 1140 12/25/15 1240  BP: 153/72 147/73  164/45  Pulse: 81 83  88  Temp:   98.3 F (36.8 C) 97.8 F (36.6 C)  TempSrc:    Oral  Resp: 21 19  19   Height:      Weight:      SpO2: 95% 95%  98%    Wt Readings from Last 3 Encounters:  12/25/15 85.276 kg (188 lb)  09/07/15 85.367 kg (188 lb 3.2 oz)  06/30/14 70.761 kg (156 lb)    General:  Appears calm and comfortable Eyes: PERRL, normal lids, irises & conjunctiva ENT: grossly normal hearing, lips & tongue Neck: no LAD, masses or thyromegaly Cardiovascular: RRR, no m/r/g. No LE edema. Respiratory: Bilateral wheezing. Normal respiratory effort. Abdomen: soft, ntnd Skin: no rash or induration seen on limited exam Musculoskeletal: grossly normal tone BUE/BLE Psychiatric: grossly normal mood and affect, speech fluent and appropriate Neurologic: grossly non-focal.          Labs on Admission:  Basic Metabolic  Panel:  Recent Labs Lab 12/25/15 0751  NA 146*  K 4.2  CL 108  CO2 31  GLUCOSE 149*  BUN 9  CREATININE 0.85  CALCIUM 8.9   CBC:  Recent Labs Lab 12/25/15 0751  WBC 8.9  HGB 12.8  HCT 38.8  MCV 93.5  PLT 226   Cardiac Enzymes:  Recent Labs Lab 12/25/15 0751  TROPONINI <0.03    BNP (last 3 results)  Recent Labs  07/23/15 1934 12/25/15 0752  BNP 63.4 131.0*     Radiological Exams on Admission: Dg Chest 2 View  12/25/2015  CLINICAL DATA:  70 year old female with shortness of breath and cough for 2 days. EXAM: CHEST  2 VIEW COMPARISON:  09/05/2015 and prior chest radiographs FINDINGS: Cardiomegaly and COPD changes again noted. There is no evidence of focal airspace disease, pulmonary edema,  suspicious pulmonary nodule/mass, pleural effusion, or pneumothorax. No acute bony abnormalities are identified. IMPRESSION: No evidence of acute cardiopulmonary disease. Cardiomegaly and COPD/emphysema. Electronically Signed   By: Margarette Canada M.D.   On: 12/25/2015 08:02    EKG: Independently reviewed. LBBB which is chronic.  Assessment/Plan Active Problems:   DM (diabetes mellitus), type 2 (HCC)   COPD exacerbation (HCC)   Acute respiratory failure with hypoxia (HCC)   History of pulmonary embolus (PE)   Chronic anticoagulation   Hypothyroidism   HTN (hypertension)   GERD (gastroesophageal reflux disease)  1. COPD exacerbation likely precipitated by acute bronchitis . Will start on abx, nebs, and steroids. Encourage pulmonary hygiene.  2. Acute respiratory failure with hypoxia related to #1. Patient noted to desat to 86% on room air while ambulating. Will try to wean off oxygen as tolerated.   3. Tobacco use. Counseled on cessation.  4. DM type 2. Start SSI.  5. Essential HTN, continue outpatient regimen.  6. Hypothyroidism, continue synthroid.  7. History of PE, on chronic anticoagulation. Continue Xarelto.  8. Anxiety, Xanax PRN.    Code Status: Full  DVT  Prophylaxis:Xarelto  Family Communication: Discussed with patient who understands and has no concerns at this time. Disposition Plan: Admit to medical bed.    Time spent: 55 minutes   Kathie Dike, MD   [facility] Pager [phone]   By signing my name below, I, Rennis Harding, attest that this documentation has been prepared under the direction and in the presence of Kathie Dike, MD. Electronically signed: Rennis Harding, Scribe. 12/25/2015 1:00pm   I, Dr. Kathie Dike, personally performed the services described in this documentaiton. All medical record entries made by the scribe were at my direction and in my presence. I have reviewed the chart and agree that the record reflects my personal performance and is accurate and complete  Kathie Dike, MD, 12/25/2015 2:57 PM

## 2015-12-25 NOTE — ED Provider Notes (Addendum)
CSN: [id number]     Arrival date & time 12/25/15  0706 History   First MD Initiated Contact with Patient 12/25/15 0720     Chief Complaint  Patient presents with  . Shortness of Breath    HPI Pt denies any history of lung disease but she is a smoker and has used an inhaler in the past when she has a cold.  She started having cough and congestion associated with a cold a couple of days ago.  She has tried home remedies like onion juice and chicken broth.   She started to feel more short of breath today and couldn't catch her breath.  She hears audible wheezing.  No fevers.  No chest pain or leg swelling.  Past Medical History  Diagnosis Date  . Hypertension   . Hyperlipidemia   . DDD (degenerative disc disease)     With chronic pain  . Osteoporosis   . Prolonged Q-T interval on ECG 10/07/2013    Secondary to hypokalemia  . Hypokalemia 10/06/2013    Secondary to lasix  . Tobacco abuse 10/07/2013  . Hypoxia 10/07/2013    From mild resp depression from opioids  . Gallstone   . Diastolic dysfunction 99991111  . Mitral valve prolapse 09/2013    Mild per echo  . Thyroid disease   . Psychosis   . Diabetes mellitus without complication ([place])   . Pulmonary embolism ([place])     dx 2016   Past Surgical History  Procedure Laterality Date  . Thyroid surgery     Family History  Problem Relation Age of Onset  . Schizophrenia Maternal Grandfather    Social History  Substance Use Topics  . Smoking status: Current Every Day Smoker    Types: Cigarettes  . Smokeless tobacco: None  . Alcohol Use: No   OB History    No data available     Review of Systems  Constitutional: Negative for fever.  HENT: Negative for sore throat, trouble swallowing and voice change.   Respiratory: Positive for shortness of breath and wheezing.   Cardiovascular: Negative for chest pain.  Gastrointestinal: Negative for abdominal pain.  Genitourinary: Negative for dysuria.  Musculoskeletal: Negative for neck  pain.  Skin: Negative for rash.  Neurological: Negative for speech difficulty.  Psychiatric/Behavioral: Negative for confusion.  All other systems reviewed and are negative.     Allergies  Aspirin  Home Medications   Prior to Admission medications   Medication Sig Start Date End Date Taking? Authorizing Provider  ALPRAZolam Duanne Moron) 0.5 MG tablet Take 1 tablet (0.5 mg total) by mouth 3 (three) times daily. Patient taking differently: Take 0.5 mg by mouth 2 (two) times daily.  06/30/14  Yes Cloria Spring, MD  amLODipine (NORVASC) 2.5 MG tablet Take 1 tablet (2.5 mg total) by mouth daily. 09/07/15  Yes Orson Eva, MD  DULoxetine (CYMBALTA) 60 MG capsule Take 60 mg by mouth daily before lunch.  06/08/15  Yes Historical Provider, MD  furosemide (LASIX) 40 MG tablet Take 40 mg by mouth 3 (three) times daily as needed for fluid.    Yes Historical Provider, MD  gabapentin (NEURONTIN) 300 MG capsule Take 300 mg by mouth 3 (three) times daily. 07/17/15  Yes Historical Provider, MD  levothyroxine (SYNTHROID, LEVOTHROID) 175 MCG tablet Take 175 mcg by mouth daily before breakfast.  09/28/13  Yes Historical Provider, MD  metoprolol tartrate (LOPRESSOR) 25 MG tablet Take 25 mg by mouth 2 (two) times daily.  08/24/13  Yes  Historical Provider, MD  omeprazole (PRILOSEC) 40 MG capsule Take 40 mg by mouth daily before breakfast.  09/28/13  Yes Historical Provider, MD  potassium chloride (K-DUR) 10 MEQ tablet Take 10 mEq by mouth 3 (three) times daily.   Yes Historical Provider, MD  risperiDONE (RISPERDAL) 0.5 MG tablet Take 0.5 mg by mouth 2 (two) times daily.   Yes Historical Provider, MD  rivaroxaban (XARELTO) 20 MG TABS tablet Take 20 mg by mouth daily with supper.   Yes Historical Provider, MD  Vitamin D, Ergocalciferol, (DRISDOL) 50000 UNITS CAPS capsule Take 1 capsule by mouth once a week. 07/17/15  Yes Historical Provider, MD   BP 160/48 mmHg  Pulse 85  Temp(Src) 97.6 F (36.4 C) (Oral)  Resp 23  Ht 5'  1" (1.549 m)  Wt 85.276 kg  BMI 35.54 kg/m2  SpO2 94% Physical Exam  Constitutional: She appears well-developed and well-nourished. No distress.  HENT:  Head: Normocephalic and atraumatic.  Right Ear: External ear normal.  Left Ear: External ear normal.  Eyes: Conjunctivae are normal. Right eye exhibits no discharge. Left eye exhibits no discharge. No scleral icterus.  Neck: Neck supple. No tracheal deviation present.  Cardiovascular: Normal rate, regular rhythm and intact distal pulses.   Pulmonary/Chest: Accessory muscle usage present. No stridor. Tachypnea noted. No respiratory distress. She has wheezes. She has no rales.  Able to speak in full sentences  Abdominal: Soft. Bowel sounds are normal. She exhibits no distension. There is no tenderness. There is no rebound and no guarding.  Musculoskeletal: She exhibits no edema or tenderness.  Neurological: She is alert. She has normal strength. No cranial nerve deficit (no facial droop, extraocular movements intact, no slurred speech) or sensory deficit. She exhibits normal muscle tone. She displays no seizure activity. Coordination normal.  Skin: Skin is warm and dry. No rash noted.  Psychiatric: She has a normal mood and affect.  Nursing note and vitals reviewed.   ED Course  Procedures (including critical care time) Labs Review Labs Reviewed  BASIC METABOLIC PANEL - Abnormal; Notable for the following:    Sodium 146 (*)    Glucose, Bld 149 (*)    All other components within normal limits  BRAIN NATRIURETIC PEPTIDE - Abnormal; Notable for the following:    B Natriuretic Peptide 131.0 (*)    All other components within normal limits  CBC  TROPONIN I    Imaging Review Dg Chest 2 View  12/25/2015  CLINICAL DATA:  70 year old female with shortness of breath and cough for 2 days. EXAM: CHEST  2 VIEW COMPARISON:  09/05/2015 and prior chest radiographs FINDINGS: Cardiomegaly and COPD changes again noted. There is no evidence of focal  airspace disease, pulmonary edema, suspicious pulmonary nodule/mass, pleural effusion, or pneumothorax. No acute bony abnormalities are identified. IMPRESSION: No evidence of acute cardiopulmonary disease. Cardiomegaly and COPD/emphysema. Electronically Signed   By: Margarette Canada M.D.   On: 12/25/2015 08:02      EKG Interpretation   Date/Time:  Monday December 25 2015 07:12:18 EST Ventricular Rate:  71 PR Interval:  201 QRS Duration: 129 QT Interval:  466 QTC Calculation: 506 R Axis:   -70 Text Interpretation:  Sinus rhythm Left bundle branch block No significant  change since last tracing Confirmed by Lashon Hillier  MD-J, Betsaida [place] ([id number]) on  12/25/2015 7:20:15 AM     Medications  amLODipine (NORVASC) tablet 2.5 mg (2.5 mg Oral Given 12/25/15 0924)  albuterol (PROVENTIL,VENTOLIN) solution continuous neb (10 mg/hr Nebulization Given 12/25/15  0743)  ipratropium (ATROVENT) nebulizer solution 0.5 mg (0.5 mg Nebulization Given 12/25/15 0742)  ipratropium (ATROVENT) 0.02 % nebulizer solution (  Duplicate [id number])    MDM   Final diagnoses:  COPD exacerbation ([place])  Secondary hypertension, unspecified    The patient presents to the emergency room with wheezing and shortness of breath. Her symptoms are suggestive of a COPD exacerbation. Patient does have significant wheezing on exam. Chest x-ray does not show evidence of pneumonia or congestive heart failure. Laboratory tests and EKG are unremarkable.  She has been treated with IV Solu-Medrol as well as breathing treatments.  She was feeling better after her hour-long neb. On repeat exam however she was still wheezing significantly. Patient attempted to walk around the emergency room and her saturations dropped into the mid 80s. She was placed back on nasal cannula oxygen. I will consult the medical service regarding admission for further treatment.      Dorie Rank, MD 12/25/15 1030

## 2015-12-25 NOTE — ED Notes (Signed)
SOB x 2 days, worse with activity. Productive cough x 2 days, while in color. Pt ran out of rescue inhaler 2 days ago. Audible wheeze. EMS initiated Albuterol 2.5 and Solumedrol 125mg 

## 2015-12-26 LAB — BASIC METABOLIC PANEL
ANION GAP: 7 (ref 5–15)
BUN: 17 mg/dL (ref 6–20)
CHLORIDE: 108 mmol/L (ref 101–111)
CO2: 27 mmol/L (ref 22–32)
Calcium: 9 mg/dL (ref 8.9–10.3)
Creatinine, Ser: 0.87 mg/dL (ref 0.44–1.00)
GFR calc non Af Amer: >60 mL/min (ref >60)
GLUCOSE: 212 mg/dL — AB (ref 65–99)
POTASSIUM: 4.8 mmol/L (ref 3.5–5.1)
Sodium: 142 mmol/L (ref 135–145)

## 2015-12-26 LAB — GLUCOSE, CAPILLARY
GLUCOSE-CAPILLARY: 325 mg/dL — AB (ref 65–99)
GLUCOSE-CAPILLARY: 208 mg/dL — AB (ref 65–99)
Glucose-Capillary: 179 mg/dL — ABNORMAL HIGH (ref 65–99)
Glucose-Capillary: 195 mg/dL — ABNORMAL HIGH (ref 65–99)

## 2015-12-26 LAB — CBC
HEMATOCRIT: 36.5 % (ref 36.0–46.0)
HEMOGLOBIN: 12.1 g/dL (ref 12.0–15.0)
MCH: 30.9 pg (ref 26.0–34.0)
MCHC: 33.2 g/dL (ref 30.0–36.0)
MCV: 93.4 fL (ref 78.0–100.0)
Platelets: 213 10*3/uL (ref 150–400)
RBC: 3.91 MIL/uL (ref 3.87–5.11)
RDW: 14.1 % (ref 11.5–15.5)
WBC: 10.8 10*3/uL — AB (ref 4.0–10.5)

## 2015-12-26 NOTE — Consult Note (Signed)
   [facility] CM Inpatient Consult   12/26/2015  Rhonda Santos 1946/10/18 [id number]  Spoke with patient at bedside regarding [facility] services. Patient does not want to participate with [facility] at this time. Patient given [facility] brochure and contact information for future reference.  Of note, [facility] Care Management services would not replace or interfere with any services that are arranged by inpatient case management or social work. For additional questions or referrals please contact:  Royetta Crochet. Laymond Purser, RN, BSN, [place] [facility] Liaison [phone]

## 2015-12-26 NOTE — Care Management Note (Signed)
Case Management Note  Patient Details  Name: Rhonda Santos MRN: [id number] Date of Birth: September 17, 1946  Subjective/Objective:                  Pt admitted from home with COPD exacerbation. Pt lives with her son and will return home at discharge. Pt is independent with ADL's. Pt has a cane, walker, and w/c for home use.  Action/Plan: Will need home O2 assessment prior to discharge. Will continue to follow.  Expected Discharge Date:                  Expected Discharge Plan:  Home/Self Care  In-House Referral:  NA  Discharge planning Services  CM Consult  Post Acute Care Choice:    Choice offered to:     DME Arranged:    DME Agency:     HH Arranged:    HH Agency:     Status of Service:  In process, will continue to follow  Medicare Important Message Given:    Date Medicare IM Given:    Medicare IM give by:    Date Additional Medicare IM Given:    Additional Medicare Important Message give by:     If discussed at [place] of Stay Meetings, dates discussed:    Additional Comments:  Joylene Draft, RN 12/26/2015, 1:30 PM

## 2015-12-26 NOTE — Progress Notes (Signed)
[facility] PROGRESS NOTE  Rhonda Santos [id number] DOB: April 13, 1946 DOA: 12/25/2015 PCP: [name], PA-C Summary  70 yof with complaints of shortness of breath and productive cough admitted for COPD exacerbation. Improvement noted with abx, nebs, and steroids. Due to increased wheezing hypoxia may need to discharge with oxygen.  Anticipate discharge within 1-2 days.  Assessment/Plan: 1. COPD exacerbation likely precipitated by acute bronchitis. Continue abx, nebs, and steroids. Encourage pulmonary hygiene. Since she continues to have significant wheezing will continue IV steroids for now.  2. Acute respiratory failure with hypoxia related to #1. Continue to wean off oxygen as tolerated.  3. Tobacco use. Counseled on cessation.  4. DM type 2. Start SSI.  5. Essential HTN, continue outpatient regimen.  6. Hypothyroidism, continue synthroid.  7. History of PE, on chronic anticoagulation. Continue Xarelto.  8. Anxiety, Xanax PRN.  Code Status: Full DVT prophylaxis: Xarelto  Family Communication: Discussed with patient who understands and has no concerns at this time. Disposition Plan: Anticipate discharge in 1-2 days   Consultants:  None  Procedures:  None  Antibiotics:  Doxycycline 01/02>>    HPI/Subjective: Feeling better. Breathing and SOB greatly improved.   Objective: Filed Vitals:   12/25/15 2149 12/26/15 0612  BP: 158/55 145/74  Pulse: 84 76  Temp: 98.5 F (36.9 C) 98.1 F (36.7 C)  Resp: 18 20    Intake/Output Summary (Last 24 hours) at 12/26/15 0746 Last data filed at 12/25/15 1855  Gross per 24 hour  Intake    240 ml  Output      0 ml  Net    240 ml   Filed Weights   12/25/15 0713  Weight: 85.276 kg (188 lb)    Exam: General: NAD, looks comfortable Cardiovascular: RRR, S1, S2  Respiratory: Bilateral wheezing. Mild increased respiratory efforts. No rales or rhonchi Abdomen: soft, non tender, no distention , bowel sounds  normal Musculoskeletal: No edema b/l  Data Reviewed: Basic Metabolic Panel:  Recent Labs Lab 12/25/15 0751 12/26/15 0620  NA 146* 142  K 4.2 4.8  CL 108 108  CO2 31 27  GLUCOSE 149* 212*  BUN 9 17  CREATININE 0.85 0.87  CALCIUM 8.9 9.0  CBC:  Recent Labs Lab 12/25/15 0751 12/26/15 0620  WBC 8.9 10.8*  HGB 12.8 12.1  HCT 38.8 36.5  MCV 93.5 93.4  PLT 226 213   Cardiac Enzymes:  Recent Labs Lab 12/25/15 0751  TROPONINI <0.03   BNP (last 3 results)  Recent Labs  07/23/15 1934 12/25/15 0752  BNP 63.4 131.0*    CBG:  Recent Labs Lab 12/25/15 1646 12/25/15 2149 12/26/15 0712  GLUCAP 390* 251* 179*    No results found for this or any previous visit (from the past 240 hour(s)).   Studies: Dg Chest 2 View  12/25/2015  CLINICAL DATA:  70 year old female with shortness of breath and cough for 2 days. EXAM: CHEST  2 VIEW COMPARISON:  09/05/2015 and prior chest radiographs FINDINGS: Cardiomegaly and COPD changes again noted. There is no evidence of focal airspace disease, pulmonary edema, suspicious pulmonary nodule/mass, pleural effusion, or pneumothorax. No acute bony abnormalities are identified. IMPRESSION: No evidence of acute cardiopulmonary disease. Cardiomegaly and COPD/emphysema. Electronically Signed   By: Margarette Canada M.D.   On: 12/25/2015 08:02    Scheduled Meds: . ALPRAZolam  0.5 mg Oral BID  . antiseptic oral rinse  7 mL Mouth Rinse BID  . doxycycline  100 mg Oral Q12H  . DULoxetine  60 mg Oral QAC lunch  . gabapentin  300 mg Oral TID  . guaiFENesin  1,200 mg Oral BID  . insulin aspart  0-15 Units Subcutaneous TID WC  . insulin aspart  0-5 Units Subcutaneous QHS  . ipratropium-albuterol  3 mL Nebulization Q6H  . levothyroxine  175 mcg Oral QAC breakfast  . methylPREDNISolone (SOLU-MEDROL) injection  60 mg Intravenous Q6H  . metoprolol tartrate  25 mg Oral BID  . nicotine  21 mg Transdermal Daily  . pantoprazole  40 mg Oral Daily  .  risperiDONE  0.5 mg Oral BID  . rivaroxaban  20 mg Oral Q supper  . sodium chloride  3 mL Intravenous Q12H   Continuous Infusions:   Active Problems:   DM (diabetes mellitus), type 2 ([place])   COPD exacerbation (HCC)   Acute respiratory failure with hypoxia (HCC)   History of pulmonary embolus (PE)   Chronic anticoagulation   Hypothyroidism   HTN (hypertension)   GERD (gastroesophageal reflux disease)    Time spent: 25 minutes     Kathie Dike, MD  [facility] Pager [phone]. If 7PM-7AM, please contact night-coverage at [email or web address], password [facility] 12/26/2015, 7:46 AM  LOS: 1 day    By signing my name below, I, Rennis Harding, attest that this documentation has been prepared under the direction and in the presence of Kathie Dike, MD. Electronically signed: Rennis Harding, Scribe. 12/26/2015 11:17am    I, Dr. Kathie Dike, personally performed the services described in this documentaiton. All medical record entries made by the scribe were at my direction and in my presence. I have reviewed the chart and agree that the record reflects my personal performance and is accurate and complete  Kathie Dike, MD, 12/26/2015 11:28 AM

## 2015-12-26 NOTE — Progress Notes (Signed)
Inpatient Diabetes Program Recommendations  AACE/ADA: New Consensus Statement on Inpatient Glycemic Control (2015)  Target Ranges:  Prepandial:   less than 140 mg/dL      Peak postprandial:   less than 180 mg/dL (1-2 hours)      Critically ill patients:  140 - 180 mg/dL   Review of Glycemic Control:  Results for Rhonda Santos, Rhonda Santos (MRN [id number]) as of 12/26/2015 13:06  Ref. Range 12/25/2015 16:46 12/25/2015 21:49 12/26/2015 07:12 12/26/2015 11:43  Glucose-Capillary Latest Ref Range: 65-99 mg/dL 390 (H) 251 (H) 179 (H) 325 (H)    Diabetes history: Type 2 diabetes Outpatient Diabetes medications: No home diabetes medications listed Current orders for Inpatient glycemic control:  Novolog moderate tid with meals and HS-Solumedrol 60 mg IV q 6 hours  Inpatient Diabetes Program Recommendations:    May consider adding Novolog meal coverage 4 units tid with meals (Hold if patient eats less than 50%).  Also please consider checking A1C to determine pre-hospitalization glycemic control.  May also benefit from q 4 hour Novolog correction while patient is on steroids.   Thanks, Adah Perl, RN, BC-ADM Inpatient Diabetes Coordinator Pager [phone] (8a-5p)

## 2015-12-27 DIAGNOSIS — E039 Hypothyroidism, unspecified: Secondary | ICD-10-CM

## 2015-12-27 DIAGNOSIS — E872 Acidosis: Secondary | ICD-10-CM

## 2015-12-27 DIAGNOSIS — Z720 Tobacco use: Secondary | ICD-10-CM

## 2015-12-27 DIAGNOSIS — J9601 Acute respiratory failure with hypoxia: Secondary | ICD-10-CM

## 2015-12-27 DIAGNOSIS — J441 Chronic obstructive pulmonary disease with (acute) exacerbation: Principal | ICD-10-CM

## 2015-12-27 DIAGNOSIS — E1165 Type 2 diabetes mellitus with hyperglycemia: Secondary | ICD-10-CM

## 2015-12-27 DIAGNOSIS — Z7901 Long term (current) use of anticoagulants: Secondary | ICD-10-CM

## 2015-12-27 LAB — CBC
HCT: 33.7 % — ABNORMAL LOW (ref 36.0–46.0)
Hemoglobin: 11.2 g/dL — ABNORMAL LOW (ref 12.0–15.0)
MCH: 30.9 pg (ref 26.0–34.0)
MCHC: 33.2 g/dL (ref 30.0–36.0)
MCV: 93.1 fL (ref 78.0–100.0)
PLATELETS: 230 10*3/uL (ref 150–400)
RBC: 3.62 MIL/uL — ABNORMAL LOW (ref 3.87–5.11)
RDW: 14.2 % (ref 11.5–15.5)
WBC: 11.9 10*3/uL — ABNORMAL HIGH (ref 4.0–10.5)

## 2015-12-27 LAB — GLUCOSE, CAPILLARY
GLUCOSE-CAPILLARY: 295 mg/dL — AB (ref 65–99)
Glucose-Capillary: 208 mg/dL — ABNORMAL HIGH (ref 65–99)

## 2015-12-27 LAB — BASIC METABOLIC PANEL
Anion gap: 6 (ref 5–15)
BUN: 22 mg/dL — AB (ref 6–20)
CO2: 28 mmol/L (ref 22–32)
CREATININE: 1.01 mg/dL — AB (ref 0.44–1.00)
Calcium: 8.8 mg/dL — ABNORMAL LOW (ref 8.9–10.3)
Chloride: 107 mmol/L (ref 101–111)
GFR calc Af Amer: >60 mL/min (ref >60)
GFR, EST NON AFRICAN AMERICAN: 55 mL/min — AB (ref >60)
Glucose, Bld: 217 mg/dL — ABNORMAL HIGH (ref 65–99)
Potassium: 4.3 mmol/L (ref 3.5–5.1)
SODIUM: 141 mmol/L (ref 135–145)

## 2015-12-27 MED ORDER — DOXYCYCLINE HYCLATE 100 MG PO TABS: 100.0000 mg | ORAL_TABLET | Freq: Two times a day (BID) | ORAL | Status: DC

## 2015-12-27 MED ORDER — ALBUTEROL SULFATE HFA 108 (90 BASE) MCG/ACT IN AERS: INHALATION_SPRAY | RESPIRATORY_TRACT | Status: DC

## 2015-12-27 MED ORDER — GLIMEPIRIDE 1 MG PO TABS: ORAL_TABLET | ORAL | Status: DC

## 2015-12-27 MED ORDER — PREDNISONE 10 MG PO TABS: ORAL_TABLET | ORAL | Status: DC

## 2015-12-27 NOTE — Care Management Note (Signed)
Case Management Note  Patient Details  Name: IRAN BLACKNER MRN: [id number] Date of Birth: January 06, 1946  Subjective/Objective:                    Action/Plan:   Expected Discharge Date:                  Expected Discharge Plan:  Home/Self Care  In-House Referral:  NA  Discharge planning Services  CM Consult  Post Acute Care Choice:  NA Choice offered to:  NA  DME Arranged:    DME Agency:     HH Arranged:    HH Agency:     Status of Service:  Completed, signed off  Medicare Important Message Given:  N/A - LOS <3 / Initial given by admissions Date Medicare IM Given:    Medicare IM give by:    Date Additional Medicare IM Given:    Additional Medicare Important Message give by:     If discussed at [place] of Stay Meetings, dates discussed:    Additional Comments: Pt for discharge home today. Pt does not qualify for home O2 at this time. No CM needs noted. Christinia Gully [place], RN 12/27/2015, 3:23 PM

## 2015-12-27 NOTE — Care Management Important Message (Signed)
Important Message  Patient Details  Name: Rhonda Santos MRN: [id number] Date of Birth: [birth year]   Medicare Important Message Given:  N/A - LOS <3 / Initial given by admissions    Joylene Draft, RN 12/27/2015, 3:21 PM

## 2015-12-27 NOTE — Progress Notes (Signed)
SATURATION QUALIFICATIONS: (This note is used to comply with regulatory documentation for home oxygen)  Patient Saturations on Room Air at Rest = 95%  Patient Saturations on Room Air while Ambulating = 93%  Patient Saturations on 3 Liters of oxygen while Ambulating = 100%  Please briefly explain why patient needs home oxygen:

## 2015-12-27 NOTE — Discharge Summary (Signed)
Physician Discharge Summary  Rhonda Santos [id number] DOB: 1946/01/23 DOA: 12/25/2015  PCP: [name], PA-C  Admit date: 12/25/2015 Discharge date: 12/27/2015  Time spent: Greater than 30 minutes  Recommendations for Outpatient Follow-up:  1. Patient discharged on when necessary Amaryl. Recommend follow-up assessment of diabetes management.  2. Recommend outpatient hemoglobin A1c. 3. Recommend continued encouragement for tobacco cessation.    Discharge Diagnoses:  1. COPD exacerbation with acute bronchitis. 2. Acute respiratory failure with hypoxia secondary to COPD exacerbation. Resolved. 3. Ongoing tobacco use. Patient advised to stop smoking. 4. Type 2 diabetes mellitus, previously diet-controlled. Patient was discharged on when necessary Amaryl which can be modified to daily per the discretion of her PCP. 5. Hypertension. 6. Hypothyroidism. 7. History of pulmonary embolism, on chronic anticoagulation with Xarelto. 8. Chronic anxiety.  Discharge Condition: Improved.  Diet recommendation: Heart healthy/carbohydrate modified.  Filed Weights   12/25/15 0713  Weight: 85.276 kg (188 lb)    History of present illness:  The patient is a 70 year old woman with a history of COPD, tobacco abuse, and diabetes mellitus, who presented to the emergency department on 12/25/2015 with a chief complaint of shortness of breath. In the ED, she was afebrile. Her oxygen saturations intermittently decreased to the 80s on room air. 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 She was admitted for treatment of acute bronchitis with COPD exacerbation.  Hospital Course:   1. COPD exacerbation and acute respiratory failure with hypoxia and tobacco abuse. The patient was started on oxygen titrated to keep her oxygen saturations greater than 90%. IV Solu-Medrol was ordered and given. Doxycycline was added empirically.  Duo nebulizers were ordered and given every 6 hours. Guaifenesin was ordered twice a day. A nicotine patch was placed. She was counseled on tobacco cessation. Over the course of the hospitalization, her symptoms abated. Her follow-up oxygen saturation on room air was 94-95% prior to discharge. Her wheezing decreased significantly. She was discharged on continued treatment with a prednisone taper, doxycycline, and albuterol inhaler. She was again advised to stop smoking.  Type 2 diabetes mellitus. The patient was told in the past that she had borderline diabetes. She had not been started on any type of medication for it. On admission, her CBG was 390. She was started on sliding scale NovoLog and Lantus. Insulin was titrated up secondary to IV Solu-Medrol. Hemoglobin A1c was not ordered during the hospitalization, but it is recommended to be assesed in the outpatient setting. She was discharged on Amaryl 1 mg daily when necessary for a CBG of 160 or greater. Recommend further monitoring in the outpatient setting as the patient may need daily rather than when necessary Amaryl.  Mild leukocytosis. [name] white blood cell count was within normal limits on admission, but increased to 11.9 prior to discharge. The increase was secondary to IV steroid treatment. She remained afebrile.  Hypertension, hypothyroidism, history of PE, chronic anxiety. All of her chronic medical conditions remained stable and treated.    Procedures:  None  Consultations:  None  Discharge Exam: Filed Vitals:   12/27/15 0916 12/27/15 1359  BP:  160/62  Pulse: 87 77  Temp:  97.6 F (36.4 C)  Resp: 16 16   oxygen saturation 94% on room air  General: Pleasant 70 year old woman in no acute distress. Cardiovascular: S1, S2, no murmurs rubs or gallops. Respiratory: Rare upper airway wheeze, but mostly clear lungs bilaterally. Breathing nonlabored.  Discharge Instructions  Discharge Instructions    Diet - low  sodium heart healthy    Complete by:  As directed      Diet Carb Modified    Complete by:  As directed      Discharge instructions    Complete by:  As directed   Try to stop smoking. Take medications as prescribed. Follow a heart healthy low sugar diet.     Increase activity slowly    Complete by:  As directed           Current Discharge Medication List    START taking these medications   Details  albuterol (PROVENTIL HFA;VENTOLIN HFA) 108 (90 Base) MCG/ACT inhaler Take 2 puffs 3 times daily for 5 more days and then every 6 hours as needed thereafter. Qty: 1 Inhaler, Refills: 2    doxycycline (VIBRA-TABS) 100 MG tablet Take 1 tablet (100 mg total) by mouth every 12 (twelve) hours. Antibiotic to be taken for 5 more days, starting tomorrow. Qty: 10 tablet, Refills: 0    glimepiride (AMARYL) 1 MG tablet For borderline diabetes. Take this medication daily if your blood sugar is greater than 160. Check your blood sugar daily. Qty: 30 tablet, Refills: 1    predniSONE (DELTASONE) 10 MG tablet Starting tomorrow, take 6 tablets daily for one day; then the next day take 5 tablets for one day; then the next day take 4 tablets for one day; then the next day take 3 tablets for one day; then the next day take 2 tablets for one day; then the next day, take 1 tablet for one day; then stop. Qty: 21 tablet, Refills: 0      CONTINUE these medications which have NOT CHANGED   Details  ALPRAZolam (XANAX) 0.5 MG tablet Take 1 tablet (0.5 mg total) by mouth 3 (three) times daily. Qty: 90 tablet, Refills: 2    amLODipine (NORVASC) 2.5 MG tablet Take 1 tablet (2.5 mg total) by mouth daily. Qty: 30 tablet, Refills: 0    DULoxetine (CYMBALTA) 60 MG capsule Take 60 mg by mouth daily before lunch.     furosemide (LASIX) 40 MG tablet Take 40 mg by mouth 3 (three) times daily as needed for fluid.     gabapentin (NEURONTIN) 300 MG capsule Take 300 mg by mouth 3 (three) times daily.    levothyroxine  (SYNTHROID, LEVOTHROID) 175 MCG tablet Take 175 mcg by mouth daily before breakfast.     metoprolol tartrate (LOPRESSOR) 25 MG tablet Take 25 mg by mouth 2 (two) times daily.     omeprazole (PRILOSEC) 40 MG capsule Take 40 mg by mouth daily before breakfast.     potassium chloride (K-DUR) 10 MEQ tablet Take 10 mEq by mouth 3 (three) times daily.    risperiDONE (RISPERDAL) 0.5 MG tablet Take 0.5 mg by mouth 2 (two) times daily.    rivaroxaban (XARELTO) 20 MG TABS tablet Take 20 mg by mouth daily with supper.    Vitamin D, Ergocalciferol, (DRISDOL) 50000 UNITS CAPS capsule Take 1 capsule by mouth once a week.       Allergies  Allergen Reactions  . Aspirin Other (See Comments)    Burns line in stomach   Follow-up Information    Follow up with [facility], BENJAMIN, PA-C On 01/03/2016.   Specialties:  Physician Assistant, Internal Medicine   Why:  at 11:00 am   Contact information:   [address] [place] [id number] [phone]        The results of  significant diagnostics from this hospitalization (including imaging, microbiology, ancillary and laboratory) are listed below for reference.    Significant Diagnostic Studies: Dg Chest 2 View  12/25/2015  CLINICAL DATA:  70 year old female with shortness of breath and cough for 2 days. EXAM: CHEST  2 VIEW COMPARISON:  09/05/2015 and prior chest radiographs FINDINGS: Cardiomegaly and COPD changes again noted. There is no evidence of focal airspace disease, pulmonary edema, suspicious pulmonary nodule/mass, pleural effusion, or pneumothorax. No acute bony abnormalities are identified. IMPRESSION: No evidence of acute cardiopulmonary disease. Cardiomegaly and COPD/emphysema. Electronically Signed   By: Margarette Canada M.D.   On: 12/25/2015 08:02    Microbiology: No results found for this or any previous visit (from the past 240 hour(s)).   Labs: Basic Metabolic Panel:  Recent Labs Lab 12/25/15 0751 12/26/15 0620 12/27/15 0705   NA 146* 142 141  K 4.2 4.8 4.3  CL 108 108 107  CO2 31 27 28   GLUCOSE 149* 212* 217*  BUN 9 17 22*  CREATININE 0.85 0.87 1.01*  CALCIUM 8.9 9.0 8.8*   Liver Function Tests: No results for input(s): AST, ALT, ALKPHOS, BILITOT, PROT, ALBUMIN in the last 168 hours. No results for input(s): LIPASE, AMYLASE in the last 168 hours. No results for input(s): AMMONIA in the last 168 hours. CBC:  Recent Labs Lab 12/25/15 0751 12/26/15 0620 12/27/15 0705  WBC 8.9 10.8* 11.9*  HGB 12.8 12.1 11.2*  HCT 38.8 36.5 33.7*  MCV 93.5 93.4 93.1  PLT 226 213 230   Cardiac Enzymes:  Recent Labs Lab 12/25/15 0751  TROPONINI <0.03   BNP: BNP (last 3 results)  Recent Labs  07/23/15 1934 12/25/15 0752  BNP 63.4 131.0*    ProBNP (last 3 results) No results for input(s): PROBNP in the last 8760 hours.  CBG:  Recent Labs Lab 12/26/15 1143 12/26/15 1621 12/26/15 2046 12/27/15 0721 12/27/15 1125  GLUCAP 325* 208* 195* 208* 295*       Signed:  Audree Schrecengost MD  FACP  [facility] 12/27/2015, 3:02 PM

## 2016-01-09 ENCOUNTER — Other Ambulatory Visit (HOSPITAL_COMMUNITY): Payer: Self-pay | Admitting: Physician Assistant

## 2016-01-09 DIAGNOSIS — IMO0002 Reserved for concepts with insufficient information to code with codable children: Secondary | ICD-10-CM

## 2016-01-22 ENCOUNTER — Other Ambulatory Visit (HOSPITAL_COMMUNITY): Payer: Self-pay

## 2016-09-22 DIAGNOSIS — D649 Anemia, unspecified: Secondary | ICD-10-CM

## 2016-09-22 HISTORY — DX: Anemia, unspecified: D64.9

## 2016-09-23 ENCOUNTER — Encounter (HOSPITAL_COMMUNITY): Payer: Self-pay | Admitting: Nurse Practitioner

## 2016-09-23 ENCOUNTER — Observation Stay (HOSPITAL_COMMUNITY)
Admission: EM | Admit: 2016-09-23 | Discharge: 2016-09-27 | Disposition: A | Discharge status: Home / Self Care | Payer: Medicare Other | Attending: Internal Medicine | Admitting: Internal Medicine

## 2016-09-23 ENCOUNTER — Emergency Department (HOSPITAL_COMMUNITY): Payer: Medicare Other

## 2016-09-23 DIAGNOSIS — I2782 Chronic pulmonary embolism: Secondary | ICD-10-CM | POA: Insufficient documentation

## 2016-09-23 DIAGNOSIS — E039 Hypothyroidism, unspecified: Secondary | ICD-10-CM | POA: Diagnosis present

## 2016-09-23 DIAGNOSIS — E876 Hypokalemia: Secondary | ICD-10-CM | POA: Diagnosis not present

## 2016-09-23 DIAGNOSIS — D649 Anemia, unspecified: Secondary | ICD-10-CM | POA: Diagnosis not present

## 2016-09-23 DIAGNOSIS — F419 Anxiety disorder, unspecified: Secondary | ICD-10-CM | POA: Diagnosis not present

## 2016-09-23 DIAGNOSIS — Z7901 Long term (current) use of anticoagulants: Secondary | ICD-10-CM | POA: Diagnosis not present

## 2016-09-23 DIAGNOSIS — F329 Major depressive disorder, single episode, unspecified: Secondary | ICD-10-CM | POA: Diagnosis not present

## 2016-09-23 DIAGNOSIS — E785 Hyperlipidemia, unspecified: Secondary | ICD-10-CM | POA: Insufficient documentation

## 2016-09-23 DIAGNOSIS — J441 Chronic obstructive pulmonary disease with (acute) exacerbation: Secondary | ICD-10-CM | POA: Diagnosis not present

## 2016-09-23 DIAGNOSIS — I519 Heart disease, unspecified: Secondary | ICD-10-CM | POA: Diagnosis present

## 2016-09-23 DIAGNOSIS — K7460 Unspecified cirrhosis of liver: Secondary | ICD-10-CM | POA: Diagnosis not present

## 2016-09-23 DIAGNOSIS — E1165 Type 2 diabetes mellitus with hyperglycemia: Secondary | ICD-10-CM | POA: Insufficient documentation

## 2016-09-23 DIAGNOSIS — M549 Dorsalgia, unspecified: Secondary | ICD-10-CM | POA: Diagnosis not present

## 2016-09-23 DIAGNOSIS — D122 Benign neoplasm of ascending colon: Secondary | ICD-10-CM | POA: Diagnosis not present

## 2016-09-23 DIAGNOSIS — D123 Benign neoplasm of transverse colon: Secondary | ICD-10-CM | POA: Insufficient documentation

## 2016-09-23 DIAGNOSIS — I5032 Chronic diastolic (congestive) heart failure: Secondary | ICD-10-CM | POA: Insufficient documentation

## 2016-09-23 DIAGNOSIS — R109 Unspecified abdominal pain: Secondary | ICD-10-CM

## 2016-09-23 DIAGNOSIS — G894 Chronic pain syndrome: Secondary | ICD-10-CM | POA: Diagnosis not present

## 2016-09-23 DIAGNOSIS — Z79899 Other long term (current) drug therapy: Secondary | ICD-10-CM | POA: Insufficient documentation

## 2016-09-23 DIAGNOSIS — Z23 Encounter for immunization: Secondary | ICD-10-CM | POA: Diagnosis not present

## 2016-09-23 DIAGNOSIS — D120 Benign neoplasm of cecum: Secondary | ICD-10-CM | POA: Diagnosis not present

## 2016-09-23 DIAGNOSIS — K922 Gastrointestinal hemorrhage, unspecified: Secondary | ICD-10-CM | POA: Insufficient documentation

## 2016-09-23 DIAGNOSIS — M6281 Muscle weakness (generalized): Secondary | ICD-10-CM

## 2016-09-23 DIAGNOSIS — IMO0001 Reserved for inherently not codable concepts without codable children: Secondary | ICD-10-CM

## 2016-09-23 DIAGNOSIS — K621 Rectal polyp: Secondary | ICD-10-CM | POA: Insufficient documentation

## 2016-09-23 DIAGNOSIS — E1122 Type 2 diabetes mellitus with diabetic chronic kidney disease: Secondary | ICD-10-CM | POA: Insufficient documentation

## 2016-09-23 DIAGNOSIS — I130 Hypertensive heart and chronic kidney disease with heart failure and stage 1 through stage 4 chronic kidney disease, or unspecified chronic kidney disease: Secondary | ICD-10-CM | POA: Insufficient documentation

## 2016-09-23 DIAGNOSIS — Z7984 Long term (current) use of oral hypoglycemic drugs: Secondary | ICD-10-CM | POA: Diagnosis not present

## 2016-09-23 DIAGNOSIS — K219 Gastro-esophageal reflux disease without esophagitis: Secondary | ICD-10-CM | POA: Insufficient documentation

## 2016-09-23 DIAGNOSIS — R1030 Lower abdominal pain, unspecified: Secondary | ICD-10-CM

## 2016-09-23 DIAGNOSIS — D509 Iron deficiency anemia, unspecified: Secondary | ICD-10-CM | POA: Diagnosis not present

## 2016-09-23 DIAGNOSIS — F17210 Nicotine dependence, cigarettes, uncomplicated: Secondary | ICD-10-CM | POA: Diagnosis not present

## 2016-09-23 DIAGNOSIS — I10 Essential (primary) hypertension: Secondary | ICD-10-CM | POA: Diagnosis present

## 2016-09-23 DIAGNOSIS — Z7982 Long term (current) use of aspirin: Secondary | ICD-10-CM | POA: Insufficient documentation

## 2016-09-23 DIAGNOSIS — N183 Chronic kidney disease, stage 3 (moderate): Secondary | ICD-10-CM | POA: Insufficient documentation

## 2016-09-23 DIAGNOSIS — R609 Edema, unspecified: Secondary | ICD-10-CM

## 2016-09-23 DIAGNOSIS — Z86711 Personal history of pulmonary embolism: Secondary | ICD-10-CM

## 2016-09-23 DIAGNOSIS — E119 Type 2 diabetes mellitus without complications: Secondary | ICD-10-CM | POA: Diagnosis present

## 2016-09-23 DIAGNOSIS — K648 Other hemorrhoids: Secondary | ICD-10-CM | POA: Insufficient documentation

## 2016-09-23 DIAGNOSIS — I5189 Other ill-defined heart diseases: Secondary | ICD-10-CM | POA: Diagnosis present

## 2016-09-23 HISTORY — DX: Opioid use, unspecified with intoxication delirium: F11.921

## 2016-09-23 HISTORY — DX: Anemia, unspecified: D64.9

## 2016-09-23 HISTORY — DX: Unspecified cirrhosis of liver: K74.60

## 2016-09-23 LAB — BASIC METABOLIC PANEL
Anion gap: 12 (ref 5–15)
CALCIUM: 8.8 mg/dL — AB (ref 8.9–10.3)
CO2: 21 mmol/L — ABNORMAL LOW (ref 22–32)
CREATININE: 1.09 mg/dL — AB (ref 0.44–1.00)
Chloride: 101 mmol/L (ref 101–111)
GFR calc Af Amer: 57 mL/min — ABNORMAL LOW (ref >60)
GFR, EST NON AFRICAN AMERICAN: 49 mL/min — AB (ref >60)
GLUCOSE: 218 mg/dL — AB (ref 65–99)
Potassium: 3.4 mmol/L — ABNORMAL LOW (ref 3.5–5.1)
SODIUM: 134 mmol/L — AB (ref 135–145)

## 2016-09-23 LAB — MAGNESIUM: MAGNESIUM: 1.6 mg/dL — AB (ref 1.7–2.4)

## 2016-09-23 LAB — CBC WITH DIFFERENTIAL/PLATELET
BASOS PCT: 1 %
Basophils Absolute: 0.1 10*3/uL (ref 0.0–0.1)
EOS ABS: 0.3 10*3/uL (ref 0.0–0.7)
EOS PCT: 3 %
HCT: 23.1 % — ABNORMAL LOW (ref 36.0–46.0)
HEMOGLOBIN: 6.1 g/dL — AB (ref 12.0–15.0)
Lymphocytes Relative: 28 %
Lymphs Abs: 2.9 10*3/uL (ref 0.7–4.0)
MCH: 18.5 pg — AB (ref 26.0–34.0)
MCHC: 26.4 g/dL — AB (ref 30.0–36.0)
MCV: 70 fL — AB (ref 78.0–100.0)
MONO ABS: 1 10*3/uL (ref 0.1–1.0)
Monocytes Relative: 10 %
NEUTROS ABS: 6.1 10*3/uL (ref 1.7–7.7)
NEUTROS PCT: 58 %
Platelets: 291 10*3/uL (ref 150–400)
RBC: 3.3 MIL/uL — ABNORMAL LOW (ref 3.87–5.11)
RDW: 19.8 % — ABNORMAL HIGH (ref 11.5–15.5)
WBC: 10.4 10*3/uL (ref 4.0–10.5)

## 2016-09-23 LAB — ABO/RH: ABO/RH(D): O POS

## 2016-09-23 LAB — CBC
HEMATOCRIT: 24 % — AB (ref 36.0–46.0)
Hemoglobin: 6.4 g/dL — CL (ref 12.0–15.0)
MCH: 18.6 pg — ABNORMAL LOW (ref 26.0–34.0)
MCHC: 26.7 g/dL — AB (ref 30.0–36.0)
MCV: 69.8 fL — ABNORMAL LOW (ref 78.0–100.0)
PLATELETS: 349 10*3/uL (ref 150–400)
RBC: 3.44 MIL/uL — ABNORMAL LOW (ref 3.87–5.11)
RDW: 19.9 % — AB (ref 11.5–15.5)
WBC: 12.2 10*3/uL — AB (ref 4.0–10.5)

## 2016-09-23 LAB — I-STAT TROPONIN, ED: Troponin i, poc: 0 ng/mL (ref 0.00–0.08)

## 2016-09-23 LAB — POC OCCULT BLOOD, ED: Fecal Occult Bld: NEGATIVE

## 2016-09-23 MED ORDER — SODIUM CHLORIDE 0.9 % IV SOLN
10.0000 mL/h | Freq: Once | INTRAVENOUS | Status: AC
  Administered 2016-09-24: 10 mL/h via INTRAVENOUS

## 2016-09-23 NOTE — H&P (Signed)
History and Physical    Rhonda Santos [id number] DOB: 1946-01-28 DOA: 09/23/2016  Referring MD/NP/PA:   PCP: [facility]   Patient coming from:  The patient is coming from home.  At baseline, pt is independent for most of ADL.   Chief Complaint: Generalized weakness and shortness of breath  HPI: Rhonda Santos is a 70 y.o. female with medical history significant of PE on Xarelto, hypertension, hyperlipidemia, diabetes mellitus, GERD, hypothyroidism, depression, anxiety, tobacco abuse, psychosis, mitral valve prolapse, dCHF, DDD, tobacco abuse, who presents with generalized weakness and shortness of breath.  Patient states that she has generalized weakness and shortness of breath for about 3 days. She has mild cough, no chest pain. She has mild abdominal pain, which is located in the lower abdomen, intermittent, nonradiating. It is not aggravated or alleviated by any known factors. She denies rectal bleeding, hematuria, hematemesis. Patient does not have symptoms of UTI, unilateral weakness, vision change or hearing loss. No nausea, vomiting, abdominal pain or diarrhea.  Patient states that she had negative colonoscopy approximately 15 years ago and negative EGD in [place] approximately 20 years ago.  ED Course: pt was found to have hemoglobin drop from 11.2 on 12/27/15-->6.4, negative FOBT, WBC 12.2, potassium 3.4. Mg 1.6, BNP 101.6, creatinine 1.09, temperature normal, no tachycardia, oxygen sats are 97% on room air. Chest x-ray showed chronic bronchitis change. CT abdomen/pelvis is negative for acute intra-abdominal problems. Patient is placed on telemetry bed of observation  Review of Systems:   General: no fevers, chills, no changes in body weight, has fatigue HEENT: no blurry vision, hearing changes or sore throat Respiratory: has dyspnea, coughing, no wheezing CV: no chest pain, no palpitations GI: no nausea, vomiting, has mild abdominal pain, no  diarrhea, constipation GU: no dysuria, burning on urination, increased urinary frequency, hematuria  Ext: has leg edema Neuro: no unilateral weakness, numbness, or tingling, no vision change or hearing loss Skin: no rash, no skin tear. MSK: No muscle spasm, no deformity, no limitation of range of movement in spin Heme: No easy bruising.  Travel history: No recent long distant travel.  Allergy:  Allergies  Allergen Reactions  . Aspirin Other (See Comments)    Burns line in stomach    Past Medical History:  Diagnosis Date  . DDD (degenerative disc disease)    With chronic pain  . Diabetes mellitus without complication ([place])   . Diastolic dysfunction [id number]  . Gallstone   . Hyperlipidemia   . Hypertension   . Hypokalemia 10/06/2013   Secondary to lasix  . Hypoxia 10/07/2013   From mild resp depression from opioids  . Mitral valve prolapse 09/2013   Mild per echo  . Osteoporosis   . Prolonged Q-T interval on ECG 10/07/2013   Secondary to hypokalemia  . Psychosis   . Pulmonary embolism ([place])    dx 2016  . Thyroid disease   . Tobacco abuse 10/07/2013    Past Surgical History:  Procedure Laterality Date  . THYROID SURGERY      Social History:  reports that she has been smoking Cigarettes.  She has never used smokeless tobacco. She reports that she does not drink alcohol or use drugs.  Family History:  Family History  Problem Relation Age of Onset  . Schizophrenia Maternal Grandfather      Prior to Admission medications   Medication Sig Start Date End Date Taking? Authorizing Provider  albuterol (PROVENTIL HFA;VENTOLIN HFA) 108 (90 Base) MCG/ACT inhaler  Take 2 puffs 3 times daily for 5 more days and then every 6 hours as needed thereafter. Patient taking differently: Inhale 2 puffs into the lungs every 6 (six) hours as needed for wheezing or shortness of breath.  12/27/15  Yes Rexene Alberts, MD  albuterol (PROVENTIL) (2.5 MG/3ML) 0.083% nebulizer solution Take 2.5 mg  by nebulization 3 (three) times daily. 08/19/16  Yes Historical Provider, MD  ALPRAZolam Duanne Moron) 0.5 MG tablet Take 1 tablet (0.5 mg total) by mouth 3 (three) times daily. Patient taking differently: Take 0.5 mg by mouth 2 (two) times daily.  06/30/14  Yes Cloria Spring, MD  diclofenac sodium (VOLTAREN) 1 % GEL Apply 1 application topically 3 (three) times daily as needed (pain).  08/16/16  Yes Historical Provider, MD  DULoxetine (CYMBALTA) 60 MG capsule Take 60 mg by mouth daily before lunch.  06/08/15  Yes Historical Provider, MD  furosemide (LASIX) 40 MG tablet Take 40 mg by mouth daily.    Yes Historical Provider, MD  gabapentin (NEURONTIN) 400 MG capsule Take 800 mg by mouth every 6 (six) hours as needed (pain).  09/09/16  Yes Historical Provider, MD  glimepiride (AMARYL) 1 MG tablet For borderline diabetes. Take this medication daily if your blood sugar is greater than 160. Check your blood sugar daily. Patient taking differently: Take 1 mg by mouth daily as needed (CBG >160). For borderline diabetes. 12/27/15  Yes Rexene Alberts, MD  ibuprofen (ADVIL,MOTRIN) 200 MG tablet Take 400 mg by mouth daily as needed (pain).   Yes Historical Provider, MD  levothyroxine (SYNTHROID, LEVOTHROID) 175 MCG tablet Take 175 mcg by mouth daily before breakfast.  09/28/13  Yes Historical Provider, MD  losartan (COZAAR) 50 MG tablet Take 50 mg by mouth daily. 08/07/16  Yes Historical Provider, MD  metFORMIN (GLUCOPHAGE) 500 MG tablet Take 500 mg by mouth 2 (two) times daily. 09/13/16  Yes Historical Provider, MD  metoprolol tartrate (LOPRESSOR) 25 MG tablet Take 25 mg by mouth 2 (two) times daily.  08/24/13  Yes Historical Provider, MD  omeprazole (PRILOSEC) 40 MG capsule Take 40 mg by mouth daily as needed (heartburn/ acid reflux).  09/28/13  Yes Historical Provider, MD  potassium chloride (K-DUR) 10 MEQ tablet Take 10 mEq by mouth 2 (two) times daily.    Yes Historical Provider, MD  rivaroxaban (XARELTO) 20 MG TABS tablet  Take 20 mg by mouth daily with breakfast.    Yes Historical Provider, MD  Tiotropium Bromide-Olodaterol (STIOLTO RESPIMAT) 2.5-2.5 MCG/ACT AERS Inhale 1 puff into the lungs daily as needed (shortness of breath).   Yes Historical Provider, MD  traMADol (ULTRAM) 50 MG tablet Take 100 mg by mouth every 6 (six) hours as needed (pain).  09/09/16  Yes Historical Provider, MD  Vitamin D, Ergocalciferol, (DRISDOL) 50000 UNITS CAPS capsule Take 50,000 Units by mouth every Monday.  07/17/15  Yes Historical Provider, MD  amLODipine (NORVASC) 2.5 MG tablet Take 1 tablet (2.5 mg total) by mouth daily. Patient not taking: Reported on 09/23/2016 09/07/15   Orson Eva, MD    Physical Exam: Vitals:   09/24/16 0600 09/24/16 0700 09/24/16 0750 09/24/16 0809  BP: 150/58 (!) 134/54 (!) 134/54 (!) 126/53  Pulse: 82 80 80 83  Resp: 18 18 17 17   Temp:      TempSrc:      SpO2: 98% 97% 99% 99%   General: Not in acute distress. Pale looking. HEENT:       Eyes: PERRL, EOMI, no scleral icterus.  ENT: No discharge from the ears and nose, no pharynx injection, no tonsillar enlargement.        Neck: No JVD, no bruit, no mass felt. Heme: No neck lymph node enlargement. Cardiac: S1/S2, RRR, No murmurs, No gallops or rubs. Respiratory: No rales, wheezing, rhonchi or rubs. GI: has abdominal distension and mild tenderness over lower abdomen, no rebound pain, no organomegaly, BS present. GU: No hematuria Ext: 1+  pitting leg edema bilaterally. 2+DP/PT pulse bilaterally. Musculoskeletal: No joint deformities, No joint redness or warmth, no limitation of ROM in spin. Skin: No rashes.  Neuro: Alert, oriented X3, cranial nerves II-XII grossly intact, moves all extremities normally. Muscle strength 5/5 in all extremities. Psych: Patient is not psychotic, no suicidal or hemocidal ideation.  Labs on Admission: I have personally reviewed following labs and imaging studies  CBC:  Recent Labs Lab 09/23/16 1757  09/23/16 2016 09/24/16 0521 09/24/16 0721  WBC 12.2* 10.4 9.5 9.3  NEUTROABS  --  6.1  --   --   HGB 6.4* 6.1* 7.5* 7.2*  HCT 24.0* 23.1* 26.3* 25.9*  MCV 69.8* 70.0* 70.1* 70.8*  PLT 349 291 297 [id number]   Basic Metabolic Panel:  Recent Labs Lab 09/23/16 1757 09/23/16 1948 09/24/16 0721  NA 134*  --  136  K 3.4*  --  3.8  CL 101  --  103  CO2 21*  --  26  GLUCOSE 218*  --  171*  BUN <5*  --  <5*  CREATININE 1.09*  --  0.99  CALCIUM 8.8*  --  8.7*  MG  --  1.6*  --    GFR: CrCl cannot be calculated (Unknown ideal weight.). Liver Function Tests: No results for input(s): AST, ALT, ALKPHOS, BILITOT, PROT, ALBUMIN in the last 168 hours. No results for input(s): LIPASE, AMYLASE in the last 168 hours. No results for input(s): AMMONIA in the last 168 hours. Coagulation Profile:  Recent Labs Lab 09/24/16 0721  INR 1.70   Cardiac Enzymes: No results for input(s): CKTOTAL, CKMB, CKMBINDEX, TROPONINI in the last 168 hours. BNP (last 3 results) No results for input(s): PROBNP in the last 8760 hours. HbA1C: No results for input(s): HGBA1C in the last 72 hours. CBG:  Recent Labs Lab 09/24/16 0420  GLUCAP 125*   Lipid Profile: No results for input(s): CHOL, HDL, LDLCALC, TRIG, CHOLHDL, LDLDIRECT in the last 72 hours. Thyroid Function Tests: No results for input(s): TSH, T4TOTAL, FREET4, T3FREE, THYROIDAB in the last 72 hours. Anemia Panel: No results for input(s): VITAMINB12, FOLATE, FERRITIN, TIBC, IRON, RETICCTPCT in the last 72 hours. Urine analysis:    Component Value Date/Time   COLORURINE YELLOW 09/05/2015 1440   APPEARANCEUR CLEAR 09/05/2015 1440   LABSPEC 1.006 09/05/2015 1440   PHURINE 5.5 09/05/2015 1440   GLUCOSEU NEGATIVE 09/05/2015 1440   HGBUR NEGATIVE 09/05/2015 1440   BILIRUBINUR NEGATIVE 09/05/2015 1440   KETONESUR NEGATIVE 09/05/2015 1440   PROTEINUR NEGATIVE 09/05/2015 1440   UROBILINOGEN 1.0 09/05/2015 1440   NITRITE NEGATIVE 09/05/2015 1440    LEUKOCYTESUR NEGATIVE 09/05/2015 1440   Sepsis Labs: @LABRCNTIP (procalcitonin:4,lacticidven:4) )No results found for this or any previous visit (from the past 240 hour(s)).   Radiological Exams on Admission: Ct Abdomen Pelvis Wo Contrast  Result Date: 09/24/2016 CLINICAL DATA:  Decreased hemoglobin.  Abdominal pain EXAM: CT ABDOMEN AND PELVIS WITHOUT CONTRAST TECHNIQUE: Multidetector CT imaging of the abdomen and pelvis was performed following the standard protocol without IV contrast. COMPARISON:  09/06/2015 FINDINGS: Lower chest: Atelectasis in  the lung bases. Hepatobiliary: Enlarged lateral segment left lobe of liver with nodular contour consistent with hepatic cirrhosis. Cholelithiasis with single stone in the gallbladder. No gallbladder wall thickening or edema. No bile duct dilatation. Pancreas: Mild fatty infiltration. Spleen: Unenhanced appearance is unremarkable. Adrenals/Urinary Tract: No adrenal gland nodules. No hydronephrosis or hydroureter. No renal, ureteral, or bladder stones. Bladder wall is not thickened. Stomach/Bowel: Stomach and small bowel are decompressed. Diffusely stool-filled colon without abnormal distention or wall thickening. Appendix is not identified. Vascular/Lymphatic: Aortic atherosclerosis. No enlarged abdominal or pelvic lymph nodes. Reproductive: Uterus and ovaries are not enlarged. Other: Abdominal wall musculature appears intact. No free air or free fluid in the abdomen. No abdominal, mesenteric, or retroperitoneal collections. Musculoskeletal: No acute or significant osseous findings. IMPRESSION: No acute process demonstrated on noncontrast imaging of the abdomen or pelvis. No evidence for hematoma. Changes of hepatic cirrhosis. Cholelithiasis. Electronically Signed   By: Lucienne Capers M.D.   On: 09/24/2016 02:21   Dg Chest 2 View  Result Date: 09/23/2016 CLINICAL DATA:  Shortness of breath upon exertion for 3-4 weeks worsening today. History of hypertension -  on meds and diabetes. EXAM: CHEST  2 VIEW COMPARISON:  12/25/2015 FINDINGS: Heart size and pulmonary vascularity are normal for technique. Linear atelectasis in the lung bases. No focal airspace disease or consolidation in the lungs. No blunting of costophrenic angles. No pneumothorax. Central peribronchial thickening may indicate chronic bronchitis. IMPRESSION: Linear atelectasis in the lung bases. Chronic bronchitic changes. No evidence of active consolidation. Electronically Signed   By: Lucienne Capers M.D.   On: 09/23/2016 21:17     EKG: Independently reviewed.  QTC 482, ?sinus Rhythm, old LBBB, LAD, poor R-wave progression  Assessment/Plan Principal Problem:   Symptomatic anemia Active Problems:   Hypokalemia   Left ventricular diastolic dysfunction, NYHA class 2   Diabetes mellitus type 2, uncontrolled, without complications (HCC)   History of pulmonary embolism/ July 23, 2015   Anemia   COPD exacerbation (HCC)   Chronic anticoagulation   Hypothyroidism   HTN (hypertension)   GERD (gastroesophageal reflux disease)   Symptomatic anemia: Patient's shortness of breath and generalized weakness are likely caused by anemia. Hemoglobin dropped from 11.2 on 12/27/15-->6.4 today. FOBT is negative. no rectal bleeding per patient. CT abdomen/pelvis negative for intracranial abnormalities, less likely to have internal bleeding. Etiology is not clear. Currently hemodynamically stable.  -will place on tele bed -hold Xarelto now -check haptoglobin, LDH, peripheral smear -INR/PTT/type & screen -transfuse 1 unit of blood -IV hydroxyzin prn for nausea -cbc q6h  Chronic Left ventricular diastolic dysfunction, NYHA class 2: 2-D echo 09/06/15 showed EF 55-60%. Patient has 1+ leg edema, but no DVT. BNP 106. CHF seems to be compensated. -Continue Lasix 40 mg daily and metoprolol  DM-II: Last A1c 6.5 on 10/07/13, poorly fairly well controled. Patient is taking metformin and Amaryl at  home -SSI  History of pulmonary embolism/ July 23, 2015:  -Hold Xarelto due to severe anemia pending work up for etiology  COPD: stable.  -prn albuterol nebs  Hypothyroidism: Last TSH was 2.25 on 04/27/14 -Continue home Synthroid  HTN: Blood pressure 133/52 -continue Lasix, Cozaar, metoprolol  GERD: -Protonix  Depression and anxiety: Stable, no suicidal or homicidal ideations. -Continue home medications: cymbalta and xanax  Hypokalemia and hypomagnesemia: K=3.4 and Mg 1.6  on admission. - Repleted both  Tobacco abuse: -Did counseling about importance of quitting smoking -Nicotine patch   DVT ppx: SCd Code Status: Full code Family Communication: None at  bed side.  Disposition Plan:  Anticipate discharge back to previous home environment Consults called:  none Admission status: Obs / tele   Date of Service 09/24/2016    Ivor Costa [facility] Pager [phone]  If 7PM-7AM, please contact night-coverage [email or web address] Password [email or web address] 09/24/2016, 8:42 AM

## 2016-09-23 NOTE — ED Notes (Signed)
Lab contacted to add on magnesium. 

## 2016-09-23 NOTE — ED Triage Notes (Signed)
Pt presents with c/o malaise and SOB. Onset of symptoms about 3 days ago. She reports cough, BLE edema. She denies fevers, pain. She is alert, breathing mildly labored at rest but she can speak full sentences. Her PCP sent her today after her CXR showed fluid on lungs in the office

## 2016-09-23 NOTE — ED Provider Notes (Signed)
[facility] DEPT Provider Note   CSN: [id number] Arrival date & time: 09/23/16  1741     History   Chief Complaint Chief Complaint  Patient presents with  . Shortness of Breath    HPI Rhonda Santos is a 70 y.o. female.  The history is provided by the patient and a relative (son).  Shortness of Breath  This is a new problem. The average episode lasts 3 days. The problem occurs continuously.The current episode started more than 2 days ago. The problem has been gradually worsening. Associated symptoms include orthopnea and leg swelling. Pertinent negatives include no fever, no headaches, no cough, no sputum production, no hemoptysis, no chest pain, no vomiting, no abdominal pain, no rash and no leg pain. Associated symptoms comments: Orthopnea x 1 year, however, DOE new x 3 days, associated with fatigue.. Precipitated by: denies recent URI or infectiuos sx. She has tried nothing for the symptoms.    Past Medical History:  Diagnosis Date  . DDD (degenerative disc disease)    With chronic pain  . Diabetes mellitus without complication ([place])   . Diastolic dysfunction 99991111  . Gallstone   . Hyperlipidemia   . Hypertension   . Hypokalemia 10/06/2013   Secondary to lasix  . Hypoxia 10/07/2013   From mild resp depression from opioids  . Mitral valve prolapse 09/2013   Mild per echo  . Osteoporosis   . Prolonged Q-T interval on ECG 10/07/2013   Secondary to hypokalemia  . Psychosis   . Pulmonary embolism ([place])    dx 2016  . Thyroid disease   . Tobacco abuse 10/07/2013    Patient Active Problem List   Diagnosis Date Noted  . COPD exacerbation ([place]) 12/25/2015  . Acute respiratory failure with hypoxia ([place]) 12/25/2015  . History of pulmonary embolus (PE) 12/25/2015  . Chronic anticoagulation 12/25/2015  . Hypothyroidism 12/25/2015  . HTN (hypertension) 12/25/2015  . GERD (gastroesophageal reflux disease) 12/25/2015  . Acute abdominal pain   . 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 Psychotic disorder 09/05/2015  . Sepsis with hypotension ([place]) 09/05/2015  . Elevated lactic acid level 09/05/2015  . Abdominal pain, acute 09/05/2015  . Acute renal failure superimposed on stage 3 chronic kidney disease ([place]) 09/05/2015  . History of pulmonary embolism/ July 23, 2015 09/05/2015  . Anemia 09/05/2015  . Blood poisoning ([place])   . 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 Diabetes mellitus type 2, uncontrolled, without complications ([place]) [id number]  . Prolonged Q-T interval on ECG 10/07/2013  . Metabolic alkalosis [id number]  . Tobacco abuse 10/07/2013  . Transaminitis 10/07/2013  . Left ventricular diastolic dysfunction, NYHA class 2 10/07/2013  . Hypokalemia 10/06/2013  . Weakness 10/06/2013  . Altered mental status 10/06/2013    Past Surgical History:  Procedure Laterality Date  . THYROID SURGERY      OB History    No data available       Home Medications    Prior to Admission medications   Medication Sig Start Date End Date Taking? Authorizing Provider  albuterol (PROVENTIL HFA;VENTOLIN HFA) 108 (90 Base) MCG/ACT inhaler Take 2 puffs 3 times daily for 5 more days and then every 6 hours as needed thereafter. Patient taking differently: Inhale 2 puffs into the lungs every 6 (six) hours as needed for wheezing or shortness of breath.  12/27/15  Yes Rexene Alberts, MD  albuterol (PROVENTIL) (2.5 MG/3ML) 0.083% nebulizer solution Take 2.5 mg by nebulization 3 (three) times daily. 08/19/16  Yes Historical Provider, MD  ALPRAZolam (XANAX) 0.5 MG tablet Take 1 tablet (0.5 mg total) by mouth 3 (three) times daily. Patient taking differently: Take 0.5 mg by mouth 2 (two) times daily.  06/30/14  Yes Cloria Spring, MD  diclofenac sodium (VOLTAREN) 1 % GEL Apply 1 application topically 3 (three) times daily as needed (pain).  08/16/16  Yes Historical Provider, MD  DULoxetine (CYMBALTA) 60 MG capsule Take 60 mg by mouth daily before lunch.  06/08/15  Yes  Historical Provider, MD  furosemide (LASIX) 40 MG tablet Take 40 mg by mouth daily.    Yes Historical Provider, MD  gabapentin (NEURONTIN) 400 MG capsule Take 800 mg by mouth every 6 (six) hours as needed (pain).  09/09/16  Yes Historical Provider, MD  glimepiride (AMARYL) 1 MG tablet For borderline diabetes. Take this medication daily if your blood sugar is greater than 160. Check your blood sugar daily. Patient taking differently: Take 1 mg by mouth daily as needed (CBG >160). For borderline diabetes. 12/27/15  Yes Rexene Alberts, MD  ibuprofen (ADVIL,MOTRIN) 200 MG tablet Take 400 mg by mouth daily as needed (pain).   Yes Historical Provider, MD  levothyroxine (SYNTHROID, LEVOTHROID) 175 MCG tablet Take 175 mcg by mouth daily before breakfast.  09/28/13  Yes Historical Provider, MD  losartan (COZAAR) 50 MG tablet Take 50 mg by mouth daily. 08/07/16  Yes Historical Provider, MD  metFORMIN (GLUCOPHAGE) 500 MG tablet Take 500 mg by mouth 2 (two) times daily. 09/13/16  Yes Historical Provider, MD  metoprolol tartrate (LOPRESSOR) 25 MG tablet Take 25 mg by mouth 2 (two) times daily.  08/24/13  Yes Historical Provider, MD  omeprazole (PRILOSEC) 40 MG capsule Take 40 mg by mouth daily as needed (heartburn/ acid reflux).  09/28/13  Yes Historical Provider, MD  potassium chloride (K-DUR) 10 MEQ tablet Take 10 mEq by mouth 2 (two) times daily.    Yes Historical Provider, MD  rivaroxaban (XARELTO) 20 MG TABS tablet Take 20 mg by mouth daily with breakfast.    Yes Historical Provider, MD  Tiotropium Bromide-Olodaterol (STIOLTO RESPIMAT) 2.5-2.5 MCG/ACT AERS Inhale 1 puff into the lungs daily as needed (shortness of breath).   Yes Historical Provider, MD  traMADol (ULTRAM) 50 MG tablet Take 100 mg by mouth every 6 (six) hours as needed (pain).  09/09/16  Yes Historical Provider, MD  Vitamin D, Ergocalciferol, (DRISDOL) 50000 UNITS CAPS capsule Take 50,000 Units by mouth every Monday.  07/17/15  Yes Historical Provider, MD    amLODipine (NORVASC) 2.5 MG tablet Take 1 tablet (2.5 mg total) by mouth daily. Patient not taking: Reported on 09/23/2016 09/07/15   Orson Eva, MD    Family History Family History  Problem Relation Age of Onset  . Schizophrenia Maternal Grandfather     Social History Social History  Substance Use Topics  . Smoking status: Current Every Day Smoker    Types: Cigarettes  . Smokeless tobacco: Never Used  . Alcohol use No     Allergies   Aspirin   Review of Systems Review of Systems  Constitutional: Positive for fatigue. Negative for chills, diaphoresis and fever.  HENT: Negative for congestion.   Respiratory: Positive for shortness of breath. Negative for cough, hemoptysis, sputum production and chest tightness.   Cardiovascular: Positive for orthopnea and leg swelling. Negative for chest pain.  Gastrointestinal: Negative for abdominal pain and vomiting.  Genitourinary: Negative for flank pain.  Musculoskeletal: Negative for arthralgias and myalgias.  Skin: Negative for rash.  Neurological: Negative for headaches.  Psychiatric/Behavioral: Negative for confusion.     Physical Exam Updated Vital Signs BP (!) 150/52   Pulse 87   Temp 98.3 F (36.8 C) (Oral)   Resp 20   SpO2 99%   Physical Exam  Constitutional: She is oriented to person, place, and time. She appears well-developed and well-nourished. No distress.  Pleasant, cooperative, overweight, otherwise non-toxic appearing  HENT:  Head: Normocephalic and atraumatic.  Eyes: No scleral icterus.  Pale conjunctiva  Neck: Normal range of motion. Neck supple. No JVD present.  Cardiovascular: Normal rate, regular rhythm and intact distal pulses.  Exam reveals no gallop and no friction rub.   Pulmonary/Chest: Effort normal and breath sounds normal. No respiratory distress. She has no rales.  Abdominal: Soft. She exhibits distension. There is no tenderness.  Musculoskeletal: Normal range of motion. She exhibits edema.  She exhibits no tenderness.  Non-pitting trace b/l edema, no calf swelling or tenderness  Neurological: She is alert and oriented to person, place, and time. She exhibits normal muscle tone. Coordination normal.  Skin: Skin is warm and dry. Capillary refill takes less than 2 seconds. No rash noted. She is not diaphoretic.  Psychiatric: She has a normal mood and affect.  Nursing note and vitals reviewed.    ED Treatments / Results  Labs (all labs ordered are listed, but only abnormal results are displayed) Labs Reviewed  BASIC METABOLIC PANEL - Abnormal; Notable for the following:       Result Value   Sodium 134 (*)    Potassium 3.4 (*)    CO2 21 (*)    Glucose, Bld 218 (*)    BUN <5 (*)    Creatinine, Ser 1.09 (*)    Calcium 8.8 (*)    GFR calc non Af Amer 49 (*)    GFR calc Af Amer 57 (*)    All other components within normal limits  CBC - Abnormal; Notable for the following:    WBC 12.2 (*)    RBC 3.44 (*)    Hemoglobin 6.4 (*)    HCT 24.0 (*)    MCV 69.8 (*)    MCH 18.6 (*)    MCHC 26.7 (*)    RDW 19.9 (*)    All other components within normal limits  MAGNESIUM - Abnormal; Notable for the following:    Magnesium 1.6 (*)    All other components within normal limits  CBC WITH DIFFERENTIAL/PLATELET - Abnormal; Notable for the following:    RBC 3.30 (*)    Hemoglobin 6.1 (*)    HCT 23.1 (*)    MCV 70.0 (*)    MCH 18.5 (*)    MCHC 26.4 (*)    RDW 19.8 (*)    All other components within normal limits  I-STAT TROPOININ, ED  POC OCCULT BLOOD, ED  TYPE AND SCREEN  ABO/RH  PREPARE RBC (CROSSMATCH)    EKG  EKG Interpretation  Date/Time:  Monday September 23 2016 17:45:36 EDT Ventricular Rate:  104 PR Interval:  208 QRS Duration: 146 QT Interval:  368 QTC Calculation: 483 R Axis:   -40 Text Interpretation:  Undetermined rhythm Left axis deviation Left bundle branch block Abnormal ECG No significant change since last tracing Confirmed by ALLEN  MD, ANTHONY ([id number])  on 09/23/2016 5:53:02 PM       Radiology Dg Chest 2 View  Result Date: 09/23/2016 CLINICAL DATA:  Shortness of breath upon exertion for 3-4 weeks worsening today. History of hypertension - on meds and diabetes.  EXAM: CHEST  2 VIEW COMPARISON:  12/25/2015 FINDINGS: Heart size and pulmonary vascularity are normal for technique. Linear atelectasis in the lung bases. No focal airspace disease or consolidation in the lungs. No blunting of costophrenic angles. No pneumothorax. Central peribronchial thickening may indicate chronic bronchitis. IMPRESSION: Linear atelectasis in the lung bases. Chronic bronchitic changes. No evidence of active consolidation. Electronically Signed   By: Lucienne Capers M.D.   On: 09/23/2016 21:17    Procedures Procedures (including critical care time)  Medications Ordered in ED Medications  0.9 %  sodium chloride infusion (not administered)     Initial Impression / Assessment and Plan / ED Course  I have reviewed the triage vital signs and the nursing notes.  Pertinent labs & imaging results that were available during my care of the patient were reviewed by me and considered in my medical decision making (see chart for details).  Clinical Course   ALIEAH WILLMORE is a 70 y.o. female who presents to ED for evaluation of 3 days of worsening DOE and fatigue. Denies fever/chills or infectious sx. Concern for HF or electrolyte derrangement. No chest pain or discomfort, doubt ACS. CXR without pulmonary edema. Hb 6.4, new anemia compared to Hb 11.2 in 12/2015. Denies hematochezia, melena, or recent trauma. No known bleeding. On xarelto. No bruising. Unclear etiology of anemia, doubt acute blood loss 2/2 not tachycardic or hypotensive. Also with low MCV indicating some ongoing component. Hemoccult negative. Type and crossed, consented for blood, released 1U to initiate in ED per hospitalist request. Admitted for further management.  Pt condition, course, and admission were  discussed with attending physician Dr. Delora Fuel.  Final Clinical Impressions(s) / ED Diagnoses   Final diagnoses:  Symptomatic anemia    New Prescriptions New Prescriptions   No medications on file     Paralee Cancel, MD [id number]    Delora Fuel, MD [id number]    David Glick, MD [id number] [id number]

## 2016-09-24 ENCOUNTER — Observation Stay (HOSPITAL_COMMUNITY): Payer: Medicare Other

## 2016-09-24 DIAGNOSIS — E1165 Type 2 diabetes mellitus with hyperglycemia: Secondary | ICD-10-CM | POA: Diagnosis not present

## 2016-09-24 DIAGNOSIS — R109 Unspecified abdominal pain: Secondary | ICD-10-CM | POA: Insufficient documentation

## 2016-09-24 DIAGNOSIS — D649 Anemia, unspecified: Secondary | ICD-10-CM | POA: Diagnosis not present

## 2016-09-24 DIAGNOSIS — Z7901 Long term (current) use of anticoagulants: Secondary | ICD-10-CM | POA: Diagnosis not present

## 2016-09-24 DIAGNOSIS — D509 Iron deficiency anemia, unspecified: Secondary | ICD-10-CM | POA: Diagnosis present

## 2016-09-24 DIAGNOSIS — J441 Chronic obstructive pulmonary disease with (acute) exacerbation: Secondary | ICD-10-CM | POA: Diagnosis not present

## 2016-09-24 LAB — CBC
HCT: 26.3 % — ABNORMAL LOW (ref 36.0–46.0)
HCT: 25.9 % — ABNORMAL LOW (ref 36.0–46.0)
HCT: 25.5 % — ABNORMAL LOW (ref 36.0–46.0)
HEMATOCRIT: 24.2 % — AB (ref 36.0–46.0)
HEMOGLOBIN: 7.2 g/dL — AB (ref 12.0–15.0)
HEMOGLOBIN: 6.8 g/dL — AB (ref 12.0–15.0)
Hemoglobin: 7.5 g/dL — ABNORMAL LOW (ref 12.0–15.0)
Hemoglobin: 7.1 g/dL — ABNORMAL LOW (ref 12.0–15.0)
MCH: 20 pg — ABNORMAL LOW (ref 26.0–34.0)
MCH: 19.7 pg — ABNORMAL LOW (ref 26.0–34.0)
MCH: 19.7 pg — ABNORMAL LOW (ref 26.0–34.0)
MCH: 19.8 pg — ABNORMAL LOW (ref 26.0–34.0)
MCHC: 28.5 g/dL — AB (ref 30.0–36.0)
MCHC: 27.8 g/dL — AB (ref 30.0–36.0)
MCHC: 27.8 g/dL — ABNORMAL LOW (ref 30.0–36.0)
MCHC: 28.1 g/dL — AB (ref 30.0–36.0)
MCV: 70.1 fL — AB (ref 78.0–100.0)
MCV: 70.8 fL — ABNORMAL LOW (ref 78.0–100.0)
MCV: 70.8 fL — AB (ref 78.0–100.0)
MCV: 70.6 fL — ABNORMAL LOW (ref 78.0–100.0)
PLATELETS: 297 10*3/uL (ref 150–400)
PLATELETS: 272 10*3/uL (ref 150–400)
PLATELETS: 290 10*3/uL (ref 150–400)
Platelets: 300 10*3/uL (ref 150–400)
RBC: 3.75 MIL/uL — ABNORMAL LOW (ref 3.87–5.11)
RBC: 3.66 MIL/uL — ABNORMAL LOW (ref 3.87–5.11)
RBC: 3.6 MIL/uL — AB (ref 3.87–5.11)
RBC: 3.43 MIL/uL — AB (ref 3.87–5.11)
RDW: 18.9 % — AB (ref 11.5–15.5)
RDW: 18.8 % — ABNORMAL HIGH (ref 11.5–15.5)
RDW: 19 % — ABNORMAL HIGH (ref 11.5–15.5)
RDW: 19.1 % — ABNORMAL HIGH (ref 11.5–15.5)
WBC: 9.5 10*3/uL (ref 4.0–10.5)
WBC: 9.3 10*3/uL (ref 4.0–10.5)
WBC: 8.6 10*3/uL (ref 4.0–10.5)
WBC: 8.6 10*3/uL (ref 4.0–10.5)

## 2016-09-24 LAB — HEPATIC FUNCTION PANEL
ALT: 19 U/L (ref 14–54)
AST: 51 U/L — ABNORMAL HIGH (ref 15–41)
Albumin: 2.8 g/dL — ABNORMAL LOW (ref 3.5–5.0)
Alkaline Phosphatase: 137 U/L — ABNORMAL HIGH (ref 38–126)
BILIRUBIN DIRECT: 0.3 mg/dL (ref 0.1–0.5)
BILIRUBIN TOTAL: 0.9 mg/dL (ref 0.3–1.2)
Indirect Bilirubin: 0.6 mg/dL (ref 0.3–0.9)
Total Protein: 6.3 g/dL — ABNORMAL LOW (ref 6.5–8.1)

## 2016-09-24 LAB — BASIC METABOLIC PANEL
Anion gap: 7 (ref 5–15)
CHLORIDE: 103 mmol/L (ref 101–111)
CO2: 26 mmol/L (ref 22–32)
Calcium: 8.7 mg/dL — ABNORMAL LOW (ref 8.9–10.3)
Creatinine, Ser: 0.99 mg/dL (ref 0.44–1.00)
GFR calc Af Amer: >60 mL/min (ref >60)
GFR calc non Af Amer: 56 mL/min — ABNORMAL LOW (ref >60)
GLUCOSE: 171 mg/dL — AB (ref 65–99)
POTASSIUM: 3.8 mmol/L (ref 3.5–5.1)
Sodium: 136 mmol/L (ref 135–145)

## 2016-09-24 LAB — CBG MONITORING, ED
GLUCOSE-CAPILLARY: 151 mg/dL — AB (ref 65–99)
GLUCOSE-CAPILLARY: 138 mg/dL — AB (ref 65–99)
Glucose-Capillary: 125 mg/dL — ABNORMAL HIGH (ref 65–99)

## 2016-09-24 LAB — PREPARE RBC (CROSSMATCH)

## 2016-09-24 LAB — GLUCOSE, CAPILLARY
GLUCOSE-CAPILLARY: 141 mg/dL — AB (ref 65–99)
Glucose-Capillary: 201 mg/dL — ABNORMAL HIGH (ref 65–99)

## 2016-09-24 LAB — PATHOLOGIST SMEAR REVIEW

## 2016-09-24 LAB — BRAIN NATRIURETIC PEPTIDE: B NATRIURETIC PEPTIDE 5: 101.6 pg/mL — AB (ref 0.0–100.0)

## 2016-09-24 LAB — PROTIME-INR
INR: 1.7
Prothrombin Time: 20.2 seconds — ABNORMAL HIGH (ref 11.4–15.2)

## 2016-09-24 LAB — LACTATE DEHYDROGENASE: LDH: 197 U/L — AB (ref 98–192)

## 2016-09-24 LAB — APTT: APTT: 42 s — AB (ref 24–36)

## 2016-09-24 MED ORDER — POTASSIUM CHLORIDE ER 10 MEQ PO TBCR: 10.0000 meq | EXTENDED_RELEASE_TABLET | Freq: Two times a day (BID) | ORAL | Status: DC

## 2016-09-24 MED ORDER — PANTOPRAZOLE SODIUM 40 MG PO TBEC
40.0000 mg | DELAYED_RELEASE_TABLET | Freq: Every day | ORAL | Status: DC
  Administered 2016-09-24 – 2016-09-27 (×4): 40 mg via ORAL
  Filled 2016-09-24 (×5): qty 1

## 2016-09-24 MED ORDER — LEVOTHYROXINE SODIUM 75 MCG PO TABS
175.0000 ug | ORAL_TABLET | Freq: Every day | ORAL | Status: DC
  Administered 2016-09-24 – 2016-09-27 (×4): 175 ug via ORAL
  Filled 2016-09-24 (×7): qty 1

## 2016-09-24 MED ORDER — INSULIN ASPART 100 UNIT/ML ~~LOC~~ SOLN
0.0000 [IU] | Freq: Every day | SUBCUTANEOUS | Status: DC
  Administered 2016-09-24: 2 [IU] via SUBCUTANEOUS

## 2016-09-24 MED ORDER — DICLOFENAC SODIUM 1 % TD GEL
1.0000 "application " | Freq: Three times a day (TID) | TRANSDERMAL | Status: DC | PRN
  Filled 2016-09-24: qty 100

## 2016-09-24 MED ORDER — SODIUM CHLORIDE 0.9 % IV SOLN
Freq: Once | INTRAVENOUS | Status: AC
  Administered 2016-09-24: 10 mL/h via INTRAVENOUS

## 2016-09-24 MED ORDER — METOPROLOL TARTRATE 25 MG PO TABS
25.0000 mg | ORAL_TABLET | Freq: Two times a day (BID) | ORAL | Status: DC
  Administered 2016-09-24 – 2016-09-27 (×7): 25 mg via ORAL
  Filled 2016-09-24 (×7): qty 1

## 2016-09-24 MED ORDER — ACETAMINOPHEN 650 MG RE SUPP: 650.0000 mg | Freq: Four times a day (QID) | RECTAL | Status: DC | PRN

## 2016-09-24 MED ORDER — HYDROXYZINE HCL 50 MG/ML IM SOLN
25.0000 mg | Freq: Four times a day (QID) | INTRAMUSCULAR | Status: DC | PRN
  Filled 2016-09-24: qty 0.5

## 2016-09-24 MED ORDER — ACETAMINOPHEN 325 MG PO TABS: 650.0000 mg | ORAL_TABLET | Freq: Four times a day (QID) | ORAL | Status: DC | PRN

## 2016-09-24 MED ORDER — ALPRAZOLAM 0.5 MG PO TABS
0.5000 mg | ORAL_TABLET | Freq: Two times a day (BID) | ORAL | Status: DC
  Administered 2016-09-24 – 2016-09-27 (×8): 0.5 mg via ORAL
  Filled 2016-09-24 (×8): qty 1

## 2016-09-24 MED ORDER — ZOLPIDEM TARTRATE 5 MG PO TABS: 5.0000 mg | ORAL_TABLET | Freq: Every evening | ORAL | Status: DC | PRN

## 2016-09-24 MED ORDER — LOSARTAN POTASSIUM 50 MG PO TABS
50.0000 mg | ORAL_TABLET | Freq: Every day | ORAL | Status: DC
  Administered 2016-09-24 – 2016-09-27 (×4): 50 mg via ORAL
  Filled 2016-09-24 (×5): qty 1

## 2016-09-24 MED ORDER — NICOTINE 21 MG/24HR TD PT24
21.0000 mg | MEDICATED_PATCH | Freq: Every day | TRANSDERMAL | Status: DC
  Administered 2016-09-24 – 2016-09-27 (×4): 21 mg via TRANSDERMAL
  Filled 2016-09-24 (×5): qty 1

## 2016-09-24 MED ORDER — DULOXETINE HCL 60 MG PO CPEP
60.0000 mg | ORAL_CAPSULE | Freq: Every day | ORAL | Status: DC
  Administered 2016-09-24 – 2016-09-27 (×3): 60 mg via ORAL
  Filled 2016-09-24 (×4): qty 1

## 2016-09-24 MED ORDER — ALBUTEROL SULFATE (2.5 MG/3ML) 0.083% IN NEBU
2.5000 mg | INHALATION_SOLUTION | Freq: Three times a day (TID) | RESPIRATORY_TRACT | Status: DC
  Administered 2016-09-24 – 2016-09-27 (×9): 2.5 mg via RESPIRATORY_TRACT
  Filled 2016-09-24 (×10): qty 3

## 2016-09-24 MED ORDER — SODIUM CHLORIDE 0.9% FLUSH
3.0000 mL | Freq: Two times a day (BID) | INTRAVENOUS | Status: DC
  Administered 2016-09-24 – 2016-09-27 (×8): 3 mL via INTRAVENOUS

## 2016-09-24 MED ORDER — PNEUMOCOCCAL VAC POLYVALENT 25 MCG/0.5ML IJ INJ
0.5000 mL | INJECTION | INTRAMUSCULAR | Status: AC
  Administered 2016-09-25: 0.5 mL via INTRAMUSCULAR
  Filled 2016-09-24: qty 0.5

## 2016-09-24 MED ORDER — POTASSIUM CHLORIDE 20 MEQ/15ML (10%) PO SOLN
20.0000 meq | Freq: Once | ORAL | Status: AC
  Administered 2016-09-24: 20 meq via ORAL
  Filled 2016-09-24: qty 15

## 2016-09-24 MED ORDER — MAGNESIUM SULFATE 2 GM/50ML IV SOLN
2.0000 g | Freq: Once | INTRAVENOUS | Status: AC
  Administered 2016-09-24: 2 g via INTRAVENOUS
  Filled 2016-09-24: qty 50

## 2016-09-24 MED ORDER — TRAMADOL HCL 50 MG PO TABS
100.0000 mg | ORAL_TABLET | Freq: Four times a day (QID) | ORAL | Status: DC | PRN
  Administered 2016-09-24 – 2016-09-27 (×5): 100 mg via ORAL
  Filled 2016-09-24 (×5): qty 2

## 2016-09-24 MED ORDER — GABAPENTIN 400 MG PO CAPS
800.0000 mg | ORAL_CAPSULE | Freq: Four times a day (QID) | ORAL | Status: DC | PRN
  Administered 2016-09-24 – 2016-09-26 (×2): 800 mg via ORAL
  Filled 2016-09-24 (×2): qty 2

## 2016-09-24 MED ORDER — FUROSEMIDE 40 MG PO TABS
40.0000 mg | ORAL_TABLET | Freq: Every day | ORAL | Status: DC
  Administered 2016-09-24 – 2016-09-25 (×2): 40 mg via ORAL
  Filled 2016-09-24: qty 2
  Filled 2016-09-24: qty 1
  Filled 2016-09-24: qty 2

## 2016-09-24 MED ORDER — INFLUENZA VAC SPLIT QUAD 0.5 ML IM SUSY
0.5000 mL | PREFILLED_SYRINGE | INTRAMUSCULAR | Status: AC
  Administered 2016-09-25: 0.5 mL via INTRAMUSCULAR

## 2016-09-24 MED ORDER — INSULIN ASPART 100 UNIT/ML ~~LOC~~ SOLN
0.0000 [IU] | Freq: Three times a day (TID) | SUBCUTANEOUS | Status: DC
  Administered 2016-09-24: 1 [IU] via SUBCUTANEOUS
  Administered 2016-09-24: 2 [IU] via SUBCUTANEOUS
  Administered 2016-09-24: 1 [IU] via SUBCUTANEOUS
  Administered 2016-09-25: 2 [IU] via SUBCUTANEOUS
  Administered 2016-09-26: 1 [IU] via SUBCUTANEOUS
  Filled 2016-09-24 (×2): qty 1

## 2016-09-24 NOTE — ED Notes (Signed)
Pt's CBG 151.  Informed Annabelle Harman, RN.

## 2016-09-24 NOTE — ED Notes (Signed)
Admitting MD at bedside.

## 2016-09-24 NOTE — Progress Notes (Signed)
PROGRESS NOTE    Rhonda Santos  [id number] DOB: Dec 17, 1946 DOA: 09/23/2016 PCP: [facility]     Brief Narrative:  Rhonda Santos is a 70 y.o. female with medical history significant of PE on Xarelto, hypertension, hyperlipidemia, diabetes mellitus, GERD, hypothyroidism, depression, anxiety, tobacco abuse, psychosis, mitral valve prolapse, dCHF, DDD, tobacco abuse, who presents with generalized weakness and shortness of breath. Patient states that she had negative colonoscopy approximately 15 years ago and negative EGD in [place] approximately 20 years ago. In the ED, her hemoglobin was found to be 6.4. Negative FOBT. CT Abdomen pelvis was negative for any acute intra-abdominal etiology. She was admitted for symptomatic anemia.  Assessment & Plan:   Principal Problem:   Symptomatic anemia Active Problems:   Hypokalemia   Left ventricular diastolic dysfunction, NYHA class 2   Diabetes mellitus type 2, uncontrolled, without complications (HCC)   History of pulmonary embolism/ July 23, 2015   Anemia   COPD exacerbation (HCC)   Chronic anticoagulation   Hypothyroidism   HTN (hypertension)   GERD (gastroesophageal reflux disease)   Symptomatic anemia: Patient's shortness of breath and generalized weakness are likely caused by anemia. Hemoglobin dropped from 11.2 on 12/27/15-->6.4. FOBT is negative. no rectal bleeding per patient. CT abdomen/pelvis negative for intracranial abnormalities, less likely to have internal bleeding. Etiology is not clear. Currently hemodynamically stable -FOBT negative  -check iron studies  -LDH elevated 197  -peripheral smear: Microcytic anemia with polychromasia -haptoglobin pending -check iron studies, folate, B12  -transfused 1u pRBC in ED  -cbc q6h  Cirrhosis: On CT abd/pelvis. No previous hx of cirrhosis or significant alcohol use per patient  -Check hepatitis    Chronic Left ventricular diastolic dysfunction, NYHA  class 2: 2-D echo 09/06/15 showed EF 55-60%. Patient has 1+ leg edema, but no DVT. BNP 106. CHF seems to be compensated. -Continue Lasix 40 mg daily and metoprolol  DM-II: Last A1c 6.5 on 10/07/13, fairly well controled. Patient is taking metformin and Amaryl at home -SSI  History of pulmonary embolism/ July 23, 2015 -Hold Xarelto due to severe anemia pending work up for etiology  COPD: stable.  -prn albuterol nebs  Hypothyroidism: Last TSH was 2.25 on 04/27/14 -Continue home Synthroid   HTN -continue Lasix, Cozaar, metoprolol  GERD: -Protonix  Depression and anxiety: Stable, no suicidal or homicidal ideations. -Continue home medications: cymbalta and xanax  Tobacco abuse: -Did counseling about importance of quitting smoking -Nicotine patch   DVT ppx: SCD Code Status: Full code Family Communication: None at bed side Disposition Plan: pending further work up and lab work.    Consultants:   None  Procedures:   None  Antimicrobials:   None    Subjective: Patient states she has felt increase in abdominal distention and thought it was weight gain or her CHF. She was evaluated by her PCP and then sent to the hospital. She admits to some dizziness, shortness of breath with exertion. She denies any vomiting blood, blood in stool, black tarry stool, vaginal bleed.   Objective: Vitals:   09/24/16 0900 09/24/16 1204 09/24/16 1209 09/24/16 1416  BP: (!) 136/51 (!) 119/47 (!) 119/47   Pulse: 80 74 71   Resp: 15 19 20    Temp:  98.8 F (37.1 C)    TempSrc:  Oral    SpO2: 98% 95% 95% 98%    Intake/Output Summary (Last 24 hours) at 09/24/16 1538 Last data filed at 09/24/16 0602  Gross per 24 hour  Intake             1408 ml  Output                0 ml  Net             1408 ml   There were no vitals filed for this visit.  Examination:  General exam: Appears calm and comfortable  Respiratory system: Clear to auscultation. Respiratory effort  normal. Cardiovascular system: S1 & S2 heard, RRR. No JVD, murmurs, rubs, gallops or clicks. No pedal edema. Gastrointestinal system: Abdomen is nondistended, soft and nontender. Obese. No organomegaly or masses felt. Normal bowel sounds heard. Central nervous system: Alert and oriented. No focal neurological deficits. Extremities: Symmetric 5 x 5 power. Skin: No rashes, lesions or ulcers Psychiatry: Judgement and insight appear normal. Mood & affect appropriate.   Data Reviewed: I have personally reviewed following labs and imaging studies  CBC:  Recent Labs Lab 09/23/16 1757 09/23/16 2016 09/24/16 0521 09/24/16 0721 09/24/16 1324  WBC 12.2* 10.4 9.5 9.3 8.6  NEUTROABS  --  6.1  --   --   --   HGB 6.4* 6.1* 7.5* 7.2* 7.1*  HCT 24.0* 23.1* 26.3* 25.9* 25.5*  MCV 69.8* 70.0* 70.1* 70.8* 70.8*  PLT 349 291 297 272 [id number]   Basic Metabolic Panel:  Recent Labs Lab 09/23/16 1757 09/23/16 1948 09/24/16 0721  NA 134*  --  136  K 3.4*  --  3.8  CL 101  --  103  CO2 21*  --  26  GLUCOSE 218*  --  171*  BUN <5*  --  <5*  CREATININE 1.09*  --  0.99  CALCIUM 8.8*  --  8.7*  MG  --  1.6*  --    GFR: CrCl cannot be calculated (Unknown ideal weight.). Liver Function Tests: No results for input(s): AST, ALT, ALKPHOS, BILITOT, PROT, ALBUMIN in the last 168 hours. No results for input(s): LIPASE, AMYLASE in the last 168 hours. No results for input(s): AMMONIA in the last 168 hours. Coagulation Profile:  Recent Labs Lab 09/24/16 0721  INR 1.70   Cardiac Enzymes: No results for input(s): CKTOTAL, CKMB, CKMBINDEX, TROPONINI in the last 168 hours. BNP (last 3 results) No results for input(s): PROBNP in the last 8760 hours. HbA1C: No results for input(s): HGBA1C in the last 72 hours. CBG:  Recent Labs Lab 09/24/16 0420 09/24/16 0745 09/24/16 1150  GLUCAP 125* 151* 138*   Lipid Profile: No results for input(s): CHOL, HDL, LDLCALC, TRIG, CHOLHDL, LDLDIRECT in the last 72  hours. Thyroid Function Tests: No results for input(s): TSH, T4TOTAL, FREET4, T3FREE, THYROIDAB in the last 72 hours. Anemia Panel: No results for input(s): VITAMINB12, FOLATE, FERRITIN, TIBC, IRON, RETICCTPCT in the last 72 hours. Sepsis Labs: No results for input(s): PROCALCITON, LATICACIDVEN in the last 168 hours.  No results found for this or any previous visit (from the past 240 hour(s)).     Radiology Studies: Ct Abdomen Pelvis Wo Contrast  Result Date: 09/24/2016 CLINICAL DATA:  Decreased hemoglobin.  Abdominal pain EXAM: CT ABDOMEN AND PELVIS WITHOUT CONTRAST TECHNIQUE: Multidetector CT imaging of the abdomen and pelvis was performed following the standard protocol without IV contrast. COMPARISON:  09/06/2015 FINDINGS: Lower chest: Atelectasis in the lung bases. Hepatobiliary: Enlarged lateral segment left lobe of liver with nodular contour consistent with hepatic cirrhosis. Cholelithiasis with single stone in the gallbladder. No gallbladder wall thickening or edema. No bile duct dilatation. Pancreas: Mild fatty infiltration.  Spleen: Unenhanced appearance is unremarkable. Adrenals/Urinary Tract: No adrenal gland nodules. No hydronephrosis or hydroureter. No renal, ureteral, or bladder stones. Bladder wall is not thickened. Stomach/Bowel: Stomach and small bowel are decompressed. Diffusely stool-filled colon without abnormal distention or wall thickening. Appendix is not identified. Vascular/Lymphatic: Aortic atherosclerosis. No enlarged abdominal or pelvic lymph nodes. Reproductive: Uterus and ovaries are not enlarged. Other: Abdominal wall musculature appears intact. No free air or free fluid in the abdomen. No abdominal, mesenteric, or retroperitoneal collections. Musculoskeletal: No acute or significant osseous findings. IMPRESSION: No acute process demonstrated on noncontrast imaging of the abdomen or pelvis. No evidence for hematoma. Changes of hepatic cirrhosis. Cholelithiasis.  Electronically Signed   By: Lucienne Capers M.D.   On: 09/24/2016 02:21   Dg Chest 2 View  Result Date: 09/23/2016 CLINICAL DATA:  Shortness of breath upon exertion for 3-4 weeks worsening today. History of hypertension - on meds and diabetes. EXAM: CHEST  2 VIEW COMPARISON:  12/25/2015 FINDINGS: Heart size and pulmonary vascularity are normal for technique. Linear atelectasis in the lung bases. No focal airspace disease or consolidation in the lungs. No blunting of costophrenic angles. No pneumothorax. Central peribronchial thickening may indicate chronic bronchitis. IMPRESSION: Linear atelectasis in the lung bases. Chronic bronchitic changes. No evidence of active consolidation. Electronically Signed   By: Lucienne Capers M.D.   On: 09/23/2016 21:17      Scheduled Meds: . albuterol  2.5 mg Nebulization TID  . ALPRAZolam  0.5 mg Oral BID  . DULoxetine  60 mg Oral QAC lunch  . furosemide  40 mg Oral Daily  . [START ON 09/25/2016] Influenza vac split quadrivalent PF  0.5 mL Intramuscular Tomorrow-1000  . insulin aspart  0-5 Units Subcutaneous QHS  . insulin aspart  0-9 Units Subcutaneous TID WC  . levothyroxine  175 mcg Oral QAC breakfast  . losartan  50 mg Oral Daily  . metoprolol tartrate  25 mg Oral BID  . nicotine  21 mg Transdermal Daily  . pantoprazole  40 mg Oral Daily  . [START ON 09/25/2016] pneumococcal 23 valent vaccine  0.5 mL Intramuscular Tomorrow-1000  . sodium chloride flush  3 mL Intravenous Q12H   Continuous Infusions:    LOS: 0 days    Time spent: 40 minutes    Dessa Phi, DO [facility] Pager [phone]  If 7PM-7AM, please contact night-coverage [email or web address] Password [email or web address] 09/24/2016, 3:38 PM

## 2016-09-24 NOTE — ED Notes (Signed)
Patient NPO at this time, no lunch was ordered.

## 2016-09-24 NOTE — ED Notes (Signed)
RN in CT with pt 

## 2016-09-24 NOTE — Evaluation (Signed)
Physical Therapy Evaluation Patient Details Name: Rhonda Santos MRN: [id number] DOB: 08-Feb-1946 Today's Date: 09/24/2016   History of Present Illness  Pt adm with weakness and SOB and found to have Hgb of 6.4. PMH -PE on Xarelto, hypertension, hyperlipidemia, diabetes mellitus, GERD, hypothyroidism, depression, anxiety, DDD.  Clinical Impression  Pt admitted with above diagnosis and presents to PT with functional limitations due to deficits listed below (See PT problem list). Pt needs skilled PT to maximize independence and safety to allow discharge to home. Expect she will return to baseline quickly. Can benefit from rollator at home to allow more community mobility which will result in incr activity tolerance and strength.     Follow Up Recommendations No PT follow up;Supervision - Intermittent    Equipment Recommendations  Other (comment) (rollator)    Recommendations for Other Services       Precautions / Restrictions Precautions Precautions: Fall      Mobility  Bed Mobility Overal bed mobility: Needs Assistance Bed Mobility: Supine to Sit;Sit to Supine     Supine to sit: Supervision Sit to supine: Supervision   General bed mobility comments: Incr time  Transfers Overall transfer level: Needs assistance Equipment used: 4-wheeled walker Transfers: Sit to/from Stand Sit to Stand: Min guard         General transfer comment: Assist for safety  Ambulation/Gait Ambulation/Gait assistance: Min guard Ambulation Distance (Feet): 200 Feet Assistive device: 4-wheeled walker Gait Pattern/deviations: Step-through pattern;Decreased stride length;Trunk flexed Gait velocity: decr Gait velocity interpretation: Below normal speed for age/gender General Gait Details: Assist for safety. Verbal cues to stand more erect  Stairs            Wheelchair Mobility    Modified Rankin (Stroke Patients Only)       Balance Overall balance assessment: Needs  assistance Sitting-balance support: No upper extremity supported;Feet supported Sitting balance-Leahy Scale: Good     Standing balance support: No upper extremity supported Standing balance-Leahy Scale: Fair                               Pertinent Vitals/Pain Pain Assessment: No/denies pain    Home Living Family/patient expects to be discharged to:: Private residence Living Arrangements: Children;Other (Comment) Available Help at Discharge: Family;Available PRN/intermittently Type of Home: House Home Access: Ramped entrance     Home Layout: One level Home Equipment: Walker - 2 wheels;Cane - single point;Bedside commode;Tub bench;Wheelchair - manual      Prior Function Level of Independence: Independent with assistive device(s)         Comments: Uses cane      Hand Dominance        Extremity/Trunk Assessment   Upper Extremity Assessment: Overall WFL for tasks assessed           Lower Extremity Assessment: Generalized weakness         Communication   Communication: No difficulties  Cognition Arousal/Alertness: Awake/alert Behavior During Therapy: WFL for tasks assessed/performed Overall Cognitive Status: Within Functional Limits for tasks assessed                      General Comments      Exercises     Assessment/Plan    PT Assessment Patient needs continued PT services  PT Problem List Decreased strength;Decreased activity tolerance;Decreased balance;Decreased mobility          PT Treatment Interventions DME instruction;Gait training;Therapeutic exercise;Balance training;Functional mobility training;Therapeutic activities;Patient/family  education    PT Goals (Current goals can be found in the Care Plan section)  Acute Rehab PT Goals Patient Stated Goal: return home and be able to walk outside PT Goal Formulation: With patient Time For Goal Achievement: 10/01/16 Potential to Achieve Goals: Good    Frequency Min  3X/week   Barriers to discharge        Co-evaluation               End of Session   Activity Tolerance: Patient tolerated treatment well Patient left: in bed;with call bell/phone within reach      Functional Assessment Tool Used: clinical judgement Functional Limitation: Mobility: Walking and moving around Mobility: Walking and Moving Around Current Status [id number]): At least 1 percent but less than 20 percent impaired, limited or restricted Mobility: Walking and Moving Around Goal Status [phone]): 0 percent impaired, limited or restricted    Time: [id number] PT Time Calculation (min) (ACUTE ONLY): 17 min   Charges:   PT Evaluation $PT Eval Low Complexity: 1 Procedure     PT G Codes:   PT G-Codes **NOT FOR INPATIENT CLASS** Functional Assessment Tool Used: clinical judgement Functional Limitation: Mobility: Walking and moving around Mobility: Walking and Moving Around Current Status [id number]): At least 1 percent but less than 20 percent impaired, limited or restricted Mobility: Walking and Moving Around Goal Status [phone]): 0 percent impaired, limited or restricted    [facility] 09/24/2016, 1:38 PM [facility] PT [phone]

## 2016-09-24 NOTE — Care Management Obs Status (Signed)
[place] NOTIFICATION   Patient Details  Name: RASCHEL BUDNER MRN: [id number] Date of Birth: [birth year]   Medicare Observation Status Notification Given:  Yes    Vergie Living, RN 09/24/2016, 10:24 AM

## 2016-09-24 NOTE — ED Notes (Signed)
Patient CBG was 138.

## 2016-09-24 NOTE — Progress Notes (Signed)
Critical low hemoglobin of 6.8 reported to MD via text page.

## 2016-09-25 ENCOUNTER — Encounter (HOSPITAL_COMMUNITY): Payer: Self-pay | Admitting: General Practice

## 2016-09-25 ENCOUNTER — Observation Stay (HOSPITAL_BASED_OUTPATIENT_CLINIC_OR_DEPARTMENT_OTHER): Payer: Medicare Other

## 2016-09-25 DIAGNOSIS — Z7901 Long term (current) use of anticoagulants: Secondary | ICD-10-CM | POA: Diagnosis not present

## 2016-09-25 DIAGNOSIS — D649 Anemia, unspecified: Secondary | ICD-10-CM

## 2016-09-25 DIAGNOSIS — E876 Hypokalemia: Secondary | ICD-10-CM | POA: Diagnosis not present

## 2016-09-25 DIAGNOSIS — K7460 Unspecified cirrhosis of liver: Secondary | ICD-10-CM | POA: Diagnosis not present

## 2016-09-25 DIAGNOSIS — I10 Essential (primary) hypertension: Secondary | ICD-10-CM | POA: Diagnosis not present

## 2016-09-25 DIAGNOSIS — E1165 Type 2 diabetes mellitus with hyperglycemia: Secondary | ICD-10-CM

## 2016-09-25 DIAGNOSIS — K219 Gastro-esophageal reflux disease without esophagitis: Secondary | ICD-10-CM | POA: Diagnosis not present

## 2016-09-25 DIAGNOSIS — K621 Rectal polyp: Secondary | ICD-10-CM | POA: Diagnosis not present

## 2016-09-25 DIAGNOSIS — E039 Hypothyroidism, unspecified: Secondary | ICD-10-CM | POA: Diagnosis not present

## 2016-09-25 DIAGNOSIS — D500 Iron deficiency anemia secondary to blood loss (chronic): Secondary | ICD-10-CM | POA: Diagnosis not present

## 2016-09-25 DIAGNOSIS — D509 Iron deficiency anemia, unspecified: Secondary | ICD-10-CM | POA: Diagnosis not present

## 2016-09-25 DIAGNOSIS — K922 Gastrointestinal hemorrhage, unspecified: Secondary | ICD-10-CM | POA: Diagnosis not present

## 2016-09-25 DIAGNOSIS — R609 Edema, unspecified: Secondary | ICD-10-CM | POA: Diagnosis not present

## 2016-09-25 DIAGNOSIS — Z538 Procedure and treatment not carried out for other reasons: Secondary | ICD-10-CM | POA: Diagnosis not present

## 2016-09-25 DIAGNOSIS — K635 Polyp of colon: Secondary | ICD-10-CM | POA: Diagnosis not present

## 2016-09-25 LAB — IRON AND TIBC
IRON: 25 ug/dL — AB (ref 28–170)
Saturation Ratios: 6 % — ABNORMAL LOW (ref 10.4–31.8)
TIBC: 435 ug/dL (ref 250–450)
UIBC: 410 ug/dL

## 2016-09-25 LAB — FOLATE RBC
FOLATE, RBC: 1460 ng/mL (ref >498)
Folate, Hemolysate: 340.1 ng/mL
Hematocrit: 23.3 % — ABNORMAL LOW (ref 34.0–46.6)

## 2016-09-25 LAB — CBC
HCT: 29.3 % — ABNORMAL LOW (ref 36.0–46.0)
HEMATOCRIT: 30 % — AB (ref 36.0–46.0)
HEMOGLOBIN: 8.8 g/dL — AB (ref 12.0–15.0)
Hemoglobin: 8.7 g/dL — ABNORMAL LOW (ref 12.0–15.0)
MCH: 21.8 pg — ABNORMAL LOW (ref 26.0–34.0)
MCH: 21.3 pg — ABNORMAL LOW (ref 26.0–34.0)
MCHC: 29.7 g/dL — ABNORMAL LOW (ref 30.0–36.0)
MCHC: 29.3 g/dL — ABNORMAL LOW (ref 30.0–36.0)
MCV: 73.4 fL — ABNORMAL LOW (ref 78.0–100.0)
MCV: 72.5 fL — ABNORMAL LOW (ref 78.0–100.0)
PLATELETS: 296 10*3/uL (ref 150–400)
Platelets: 277 10*3/uL (ref 150–400)
RBC: 3.99 MIL/uL (ref 3.87–5.11)
RBC: 4.14 MIL/uL (ref 3.87–5.11)
RDW: 19.9 % — ABNORMAL HIGH (ref 11.5–15.5)
RDW: 19.7 % — ABNORMAL HIGH (ref 11.5–15.5)
WBC: 8.8 10*3/uL (ref 4.0–10.5)
WBC: 10.3 10*3/uL (ref 4.0–10.5)

## 2016-09-25 LAB — TYPE AND SCREEN
ABO/RH(D): O POS
Antibody Screen: NEGATIVE
UNIT DIVISION: 0
Unit division: 0

## 2016-09-25 LAB — GLUCOSE, CAPILLARY
GLUCOSE-CAPILLARY: 136 mg/dL — AB (ref 65–99)
GLUCOSE-CAPILLARY: 155 mg/dL — AB (ref 65–99)
Glucose-Capillary: 121 mg/dL — ABNORMAL HIGH (ref 65–99)
Glucose-Capillary: 186 mg/dL — ABNORMAL HIGH (ref 65–99)

## 2016-09-25 LAB — HAPTOGLOBIN: Haptoglobin: 168 mg/dL (ref 34–200)

## 2016-09-25 LAB — VITAMIN B12: Vitamin B-12: 402 pg/mL (ref 180–914)

## 2016-09-25 LAB — FERRITIN: FERRITIN: 9 ng/mL — AB (ref 11–307)

## 2016-09-25 MED ORDER — PEG-KCL-NACL-NASULF-NA ASC-C 100 G PO SOLR
0.5000 | Freq: Once | ORAL | Status: AC
  Administered 2016-09-26: 100 g via ORAL
  Filled 2016-09-25: qty 1

## 2016-09-25 MED ORDER — METOCLOPRAMIDE HCL 5 MG/ML IJ SOLN
10.0000 mg | Freq: Once | INTRAMUSCULAR | Status: AC
  Administered 2016-09-26: 10 mg via INTRAVENOUS
  Filled 2016-09-25: qty 2

## 2016-09-25 MED ORDER — BISACODYL 5 MG PO TBEC
10.0000 mg | DELAYED_RELEASE_TABLET | Freq: Once | ORAL | Status: AC
  Administered 2016-09-25: 10 mg via ORAL
  Filled 2016-09-25: qty 2

## 2016-09-25 MED ORDER — PEG-KCL-NACL-NASULF-NA ASC-C 100 G PO SOLR: 1.0000 | Freq: Once | ORAL | Status: DC

## 2016-09-25 MED ORDER — PEG-KCL-NACL-NASULF-NA ASC-C 100 G PO SOLR
0.5000 | Freq: Once | ORAL | Status: AC
  Administered 2016-09-25: 100 g via ORAL
  Filled 2016-09-25: qty 1

## 2016-09-25 MED ORDER — METOCLOPRAMIDE HCL 5 MG/ML IJ SOLN
10.0000 mg | Freq: Once | INTRAMUSCULAR | Status: AC
  Administered 2016-09-25: 10 mg via INTRAVENOUS
  Filled 2016-09-25: qty 2

## 2016-09-25 NOTE — Progress Notes (Signed)
Spoke with patient daughter Anders Grant she wants update on her mother. Spoke with patient she states that it is ok to inform of her medical progress. Arthor Captain LPN

## 2016-09-25 NOTE — Consult Note (Signed)
                                                                           [facility] Consult: 9:51 AM 09/25/2016  LOS: 0 days    Referring Provider: Dr Ghimire  Primary Care Physician:  [facility] Primary Gastroenterologist:  unassigned   Reason for Consultation:  Microcytic anemia and cirrhosis.    HPI: Rhonda Santos is a 70 y.o. female.  PMH: hx hyperthyroidism (?[place]) and s/p thyroidectomy with subsequent hypothyroidism.  Hx PE 06/2015, on Xarelto, pt says she did not have DVT.  Depression, anxiety.  Prolonged QT in setting of hypokalemia 2014.  Opioid induced resp failure and encephalopathy in 2014.  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 Chronic pain, DDD.  HTN.  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 Fatty liver with elevated LFTs in 2014, cirrhosis seen on CT as of 08/2015.  Stage 3 CKD.  Normocytic anemia. COPD.  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  Diastolic heart failure, EF 55 to 60 %.   Listed meds include Diclofenac gel , Ibuprofen, prn Omeprazole. She only the gel and omeprazole, not Ibuprofen.  No narcotics/opiates either.    Present to ED on advisement of PMD with weakness, dyspnea for ~ 3 days.  Mild lower abdominal pain.  No N/V, diarrhea.  Hgb 6.1, MCV 70 .  Hgb 11.2, MCV 93 in 12/2015.  Iron low and ferritin 9 Albumin 2.8.  AST/ALT 51/19.  T bili 0.9.  Alk phos 137.   coags 20/1.7. Non-contrasted CT shows cirrhosis, no suspicious liver masses.  Solitary gallstone.    Hemoglobin is now 8.8 following transfusion PRBCs x 2.  Pt never told before now of the cirrhosis dx.  Patient has been a lifelong teetotaler. Never used any illicit drugs or participated in behaviors which would put her at risk for HIV or hepatitis. Remote screening colonoscopy in [place] 20 or more years ago, no pathology  per pt recall.  Periodic heartburn from specific foods.  No dysphagia.  Good appetite.  Weight stable. No new abdominal swelling but has stable, marked central obesity.  No BPR, melena.  No constipation, diarrhea, altered bowel habits. Dad died in his early 50s of a cancer, she does not know type.    Past Medical History:  Diagnosis Date  . DDD (degenerative disc disease)    With chronic pain  . Diabetes mellitus without complication (HCC)   . Diastolic dysfunction 09/2013  . Gallstone   . Hyperlipidemia   . Hypertension   . Hypokalemia 10/06/2013   Secondary to lasix  . Hypoxia 10/07/2013   From mild resp depression from opioids  . Mitral valve prolapse 09/2013   Mild per echo  . Osteoporosis   . Prolonged Q-T interval on ECG 10/07/2013   Secondary to hypokalemia  . Psychosis   . Pulmonary embolism (HCC)    dx 2016  . Thyroid disease   . Tobacco abuse 10/07/2013    Past Surgical History:  Procedure Laterality Date  . THYROID SURGERY      Prior to Admission medications   Medication Sig Start Date End Date Taking? Authorizing Provider  albuterol (PROVENTIL   HFA;VENTOLIN HFA) 108 (90 Base) MCG/ACT inhaler Take 2 puffs 3 times daily for 5 more days and then every 6 hours as needed thereafter. Patient taking differently: Inhale 2 puffs into the lungs every 6 (six) hours as needed for wheezing or shortness of breath.  12/27/15  Yes Denise Fisher, MD  albuterol (PROVENTIL) (2.5 MG/3ML) 0.083% nebulizer solution Take 2.5 mg by nebulization 3 (three) times daily. 08/19/16  Yes Historical Provider, MD  ALPRAZolam (XANAX) 0.5 MG tablet Take 1 tablet (0.5 mg total) by mouth 3 (three) times daily. Patient taking differently: Take 0.5 mg by mouth 2 (two) times daily.  06/30/14  Yes Deborah R Ross, MD  diclofenac sodium (VOLTAREN) 1 % GEL Apply 1 application topically 3 (three) times daily as needed (pain).  08/16/16  Yes Historical Provider, MD  DULoxetine (CYMBALTA) 60 MG capsule Take 60 mg by  mouth daily before lunch.  06/08/15  Yes Historical Provider, MD  furosemide (LASIX) 40 MG tablet Take 40 mg by mouth daily.    Yes Historical Provider, MD  gabapentin (NEURONTIN) 400 MG capsule Take 800 mg by mouth every 6 (six) hours as needed (pain).  09/09/16  Yes Historical Provider, MD  glimepiride (AMARYL) 1 MG tablet For borderline diabetes. Take this medication daily if your blood sugar is greater than 160. Check your blood sugar daily. Patient taking differently: Take 1 mg by mouth daily as needed (CBG >160). For borderline diabetes. 12/27/15  Yes Denise Fisher, MD  ibuprofen (ADVIL,MOTRIN) 200 MG tablet Take 400 mg by mouth daily as needed (pain).   Yes Historical Provider, MD  levothyroxine (SYNTHROID, LEVOTHROID) 175 MCG tablet Take 175 mcg by mouth daily before breakfast.  09/28/13  Yes Historical Provider, MD  losartan (COZAAR) 50 MG tablet Take 50 mg by mouth daily. 08/07/16  Yes Historical Provider, MD  metFORMIN (GLUCOPHAGE) 500 MG tablet Take 500 mg by mouth 2 (two) times daily. 09/13/16  Yes Historical Provider, MD  metoprolol tartrate (LOPRESSOR) 25 MG tablet Take 25 mg by mouth 2 (two) times daily.  08/24/13  Yes Historical Provider, MD  omeprazole (PRILOSEC) 40 MG capsule Take 40 mg by mouth daily as needed (heartburn/ acid reflux).  09/28/13  Yes Historical Provider, MD  potassium chloride (K-DUR) 10 MEQ tablet Take 10 mEq by mouth 2 (two) times daily.    Yes Historical Provider, MD  rivaroxaban (XARELTO) 20 MG TABS tablet Take 20 mg by mouth daily with breakfast.    Yes Historical Provider, MD  Tiotropium Bromide-Olodaterol (STIOLTO RESPIMAT) 2.5-2.5 MCG/ACT AERS Inhale 1 puff into the lungs daily as needed (shortness of breath).   Yes Historical Provider, MD  traMADol (ULTRAM) 50 MG tablet Take 100 mg by mouth every 6 (six) hours as needed (pain).  09/09/16  Yes Historical Provider, MD  Vitamin D, Ergocalciferol, (DRISDOL) 50000 UNITS CAPS capsule Take 50,000 Units by mouth every  Monday.  07/17/15  Yes Historical Provider, MD  amLODipine (NORVASC) 2.5 MG tablet Take 1 tablet (2.5 mg total) by mouth daily. Patient not taking: Reported on 09/23/2016 09/07/15   David Tat, MD    Scheduled Meds: . albuterol  2.5 mg Nebulization TID  . ALPRAZolam  0.5 mg Oral BID  . DULoxetine  60 mg Oral QAC lunch  . furosemide  40 mg Oral Daily  . Influenza vac split quadrivalent PF  0.5 mL Intramuscular Tomorrow-1000  . insulin aspart  0-5 Units Subcutaneous QHS  . insulin aspart  0-9 Units Subcutaneous TID WC  . levothyroxine    175 mcg Oral QAC breakfast  . losartan  50 mg Oral Daily  . metoprolol tartrate  25 mg Oral BID  . nicotine  21 mg Transdermal Daily  . pantoprazole  40 mg Oral Daily  . pneumococcal 23 valent vaccine  0.5 mL Intramuscular Tomorrow-1000  . sodium chloride flush  3 mL Intravenous Q12H   Infusions:   PRN Meds: acetaminophen **OR** acetaminophen, diclofenac sodium, gabapentin, traMADol, zolpidem   Allergies as of 09/23/2016 - Review Complete 09/23/2016  Allergen Reaction Noted  . Aspirin Other (See Comments) 10/06/2013    Family History  Problem Relation Age of Onset  . Schizophrenia Maternal Grandfather     Social History   Social History  . Marital status: Widowed    Spouse name: N/A  . Number of children: N/A  . Years of education: N/A   Occupational History  . Not on file.   Social History Main Topics  . Smoking status: Current Every Day Smoker    Types: Cigarettes  . Smokeless tobacco: Never Used  . Alcohol use No  . Drug use: No  . Sexual activity: No   Other Topics Concern  . Not on file   Social History Narrative  . No narrative on file    REVIEW OF SYSTEMS: Constitutional: recent onset fatigue ENT:  No nose bleeds Pulm:  Stable DOE with heavier housecleaning and longer walking.  No cough.  Only uses nebulizers prn CV:  No palpitations, No chest pain. Dependent lower extremity edema if she stands or is on her feet for a  long time. GU:  No hematuria, no frequency GI:  Per HPI Heme:  No Excessive bleeding or bruising. Does suffer from small purpura on the upper extremities if she knocks herself No previous requirements to take iron for anemia.   Transfusions:  Never before required transfusions. Neuro:  No headaches, no peripheral tingling or numbness Derm:  No itching, no rash or sores.  Endocrine:  No sweats or chills.  No polyuria or dysuria Immunization:  She is requesting a flu shot. She thinks she had Pneumovax about 5 years ago. Is not certain. Travel:  None beyond local counties in last few months.    PHYSICAL EXAM: Vital signs in last 24 hours: Vitals:   09/25/16 0616 09/25/16 0858  BP: (!) 120/59 (!) 134/54  Pulse: 70 72  Resp: 20 20  Temp: 98 F (36.7 C) 99.1 F (37.3 C)   Wt Readings from Last 3 Encounters:  09/24/16 86.2 kg (190 lb)  12/25/15 85.3 kg (188 lb)  09/07/15 85.4 kg (188 lb 3.2 oz)    General: Obese, pleasant, comfortable WF.  She is pale Head:  No facial asymmetry or swelling.  Eyes:  No scleral icterus. No conjunctival pallor. EOMI. Ears:  Not HOH  Nose:  No congestion or discharge. Mouth:  Tongue midline. Mucosa is moist and clear. Neck:  No mass, no JVD. Long thyroidectomy scar evident at the base of the neck. Lungs:  Clear bilaterally. No cough. No dyspnea. Heart: RRR. No MRG. Abdomen:  Marketed central obesity. Abdomen is soft. Nontender. There are the beginnings of an umbilicus hernia. No hepatosplenomegaly. Active bowel sounds..   Rectal: Deferred. Stool sent to the lab tested FOBT negative.   Musc/Skeltl: No joint redness or swelling. No significant contracture deformities. Some kyphosis in the spine. Extremities:  No CCE.  Neurologic:  Patient is alert. Oriented 3. No tremor. No asterixis. Limb strength not tested but moves all 4 limbs easily.   Skin:  No jaundice, no telangiectasia, no sores. No significant bruising. Tattoos:  None Nodes:  No cervical  adenopathy.   Psych:  Pleasant, cooperative. Slightly anxious at the discussion of cirrhosis.  Intake/Output from previous day: 10/03 0701 - 10/04 0700 In: 1523.7 [P.O.:720; Blood:803.7] Out: -  Intake/Output this shift: No intake/output data recorded.  LAB RESULTS:  Recent Labs  09/24/16 1923 09/25/16 0306 09/25/16 0910  WBC 8.6 8.8 10.3  HGB 6.8* 8.7* 8.8*  HCT 24.2* 29.3* 30.0*  PLT 300 296 277   BMET Lab Results  Component Value Date   NA 136 09/24/2016   NA 134 (L) 09/23/2016   NA 141 12/27/2015   K 3.8 09/24/2016   K 3.4 (L) 09/23/2016   K 4.3 12/27/2015   CL 103 09/24/2016   CL 101 09/23/2016   CL 107 12/27/2015   CO2 26 09/24/2016   CO2 21 (L) 09/23/2016   CO2 28 12/27/2015   GLUCOSE 171 (H) 09/24/2016   GLUCOSE 218 (H) 09/23/2016   GLUCOSE 217 (H) 12/27/2015   BUN <5 (L) 09/24/2016   BUN <5 (L) 09/23/2016   BUN 22 (H) 12/27/2015   CREATININE 0.99 09/24/2016   CREATININE 1.09 (H) 09/23/2016   CREATININE 1.01 (H) 12/27/2015   CALCIUM 8.7 (L) 09/24/2016   CALCIUM 8.8 (L) 09/23/2016   CALCIUM 8.8 (L) 12/27/2015   LFT  Recent Labs  09/24/16 1923  PROT 6.3*  ALBUMIN 2.8*  AST 51*  ALT 19  ALKPHOS 137*  BILITOT 0.9  BILIDIR 0.3  IBILI 0.6   PT/INR Lab Results  Component Value Date   INR 1.70 09/24/2016   INR 1.97 (H) 09/05/2015   INR 1.17 01/19/2014   Hepatitis Panel No results for input(s): HEPBSAG, HCVAB, HEPAIGM, HEPBIGM in the last 72 hours. C-Diff No components found for: CDIFF    RADIOLOGY STUDIES: Ct Abdomen Pelvis Wo Contrast  Result Date: 09/24/2016 CLINICAL DATA:  Decreased hemoglobin.  Abdominal pain EXAM: CT ABDOMEN AND PELVIS WITHOUT CONTRAST TECHNIQUE: Multidetector CT imaging of the abdomen and pelvis was performed following the standard protocol without IV contrast. COMPARISON:  09/06/2015 FINDINGS: Lower chest: Atelectasis in the lung bases. Hepatobiliary: Enlarged lateral segment left lobe of liver with nodular  contour consistent with hepatic cirrhosis. Cholelithiasis with single stone in the gallbladder. No gallbladder wall thickening or edema. No bile duct dilatation. Pancreas: Mild fatty infiltration. Spleen: Unenhanced appearance is unremarkable. Adrenals/Urinary Tract: No adrenal gland nodules. No hydronephrosis or hydroureter. No renal, ureteral, or bladder stones. Bladder wall is not thickened. Stomach/Bowel: Stomach and small bowel are decompressed. Diffusely stool-filled colon without abnormal distention or wall thickening. Appendix is not identified. Vascular/Lymphatic: Aortic atherosclerosis. No enlarged abdominal or pelvic lymph nodes. Reproductive: Uterus and ovaries are not enlarged. Other: Abdominal wall musculature appears intact. No free air or free fluid in the abdomen. No abdominal, mesenteric, or retroperitoneal collections. Musculoskeletal: No acute or significant osseous findings. IMPRESSION: No acute process demonstrated on noncontrast imaging of the abdomen or pelvis. No evidence for hematoma. Changes of hepatic cirrhosis. Cholelithiasis. Electronically Signed   By: William  Stevens M.D.   On: 09/24/2016 02:21   Dg Chest 2 View  Result Date: 09/23/2016 CLINICAL DATA:  Shortness of breath upon exertion for 3-4 weeks worsening today. History of hypertension - on meds and diabetes. EXAM: CHEST  2 VIEW COMPARISON:  12/25/2015 FINDINGS: Heart size and pulmonary vascularity are normal for technique. Linear atelectasis in the lung bases. No focal airspace disease or consolidation in the lungs.   No blunting of costophrenic angles. No pneumothorax. Central peribronchial thickening may indicate chronic bronchitis. IMPRESSION: Linear atelectasis in the lung bases. Chronic bronchitic changes. No evidence of active consolidation. Electronically Signed   By: William  Stevens M.D.   On: 09/23/2016 21:17     IMPRESSION:   *  Microcytic anemia.  Though FOBT negative x 1, need to r/o chronic GI blood loss.  Rule out colonic neoplasia, rule out portal gastropathy, rule out gastritis/ulcers. Her GI review of systems is unremarkable. Hemoglobin improved following transfusion  *  Cirrhosis of liver per CT scan.  This is likely the evolution of [place]/NAFLD as she is a lifelong teetotaler and has no risk factors for hepatitis B or C. PT/INR are slightly elevated, hard to say whether this is the result of the Xarelto or whether it is the result of some hepatic synthetic dysfunction.  *  Hx PE on chronic Xarelto. This is on hold. Last dose 10/2.   PLAN:     *  Patient should undergo upper endoscopy as well as colonoscopy.  Question whether we get this done tomorrow or on 10/6, will need to look at the schedule. Procedures are not urgent but need to get done before she discharges. She looks stable to undergo these procedures. She will have been off Xarelto greater than 48 hours by then.  Patient had been planning to get a colonoscopy so she is happy that this can be done while she is an inpatient.  *   labs ordered:   antimitochondrial and smooth muscle antibodies, and a, hepatitis B surface antigen and surface antibody as well as hepatitis C antibody.  CBC in AM.  *  Once daily Protonix had been added by the admitting physician, this seems reasonable for the time being. After endoscopy we can decide whether or not she needs this chronically.     Quincy Prisco  09/25/2016, 9:51 AM Pager: [phone]     

## 2016-09-25 NOTE — Progress Notes (Signed)
*  Preliminary Results* Bilateral lower extremity venous duplex completed. Bilateral lower extremities are negative for deep vein thrombosis. There is no evidence of Baker's cyst bilaterally.  09/25/2016 4:08 PM Maudry Mayhew, BS, RVT, RDCS, RDMS

## 2016-09-25 NOTE — Progress Notes (Signed)
PROGRESS NOTE        PATIENT DETAILS Name: Rhonda Santos Age: 70 y.o. Sex: female Date of Birth: August 16, 1946 Admit Date: 09/23/2016 Admitting Physician Ivor Costa, MD PCP:[facility]  Brief Narrative: Patient is a 70 y.o. female with history of one episode of pulmonary embolism (July 2016) on Xarelto, hypertension, dyslipidemia, diabetes who presented to the ED for evaluation of shortness of breath, she was found to have a hemoglobin of 6.4. She was admitted for further evaluation and treatment.  Subjective: Feels better-denies any recent hematochezia or melena.  No chest pain No abdominal pain  Assessment/Plan: Principal Problem: New onset symptomatic iron deficiency anemia: High suspicion for occult/chronic GI bleeding-given the fact that she is on anticoagulation-and there is some suggestion of cirrhosis on a CT scan. 2 units of PRBC transfused since admission. Have consulted GI for endoscopic evaluation.  Active Problems: Suspected chronic GI bleeding: See above. GI consult and for endoscopic evaluation. She is status post 2 units of PRBCs so far. Continue PPI for now.  ? Liver cirrhosis: Seen on CT scan of the abdomen. No history of EtOH use. Hepatitis serology, ANA/antimitochondrial antibody, anti-smooth muscle antibody are all pending.  Chronic diastolic heart failure: Compensated, continue Lasix and metoprolol. Follow weights.  History of pulmonary embolism July 2016: First episode of venous thromboembolism-has already completed 1 year of anticoagulation-check lower extremity Dopplers. Xarelto remains on hold-? Resume on discharge as she has already completed 1 year of anticoagulation treatment.  Type 2 diabetes: CBGs stable on SSI, resume oral hypoglycemic agents on discharge.  Hypothyroidism: Continue Synthroid  Hypertension: Controlled, continue metoprolol and losartan  Chronic pain syndrome/chronic back  pain/arthritic pain: Follows with pain management-continue Neurontin, Cymbalta and as needed tramadol.  Anxiety/depression: Stable, continue as needed Xanax and Cymbalta.  COPD: Appear stable-lungs are clear, continue as needed bronchodilators  Tobacco abuse: Counseled  DVT Prophylaxis: SCD's  Code Status: Full code   Family Communication: None at bedside-patient is awake and alert-and understanding of the above noted plan.  Disposition Plan: Remain inpatient-may need home health services on discharge  Antimicrobial agents: None  Procedures: None  CONSULTS:  None  Time spent: 25- minutes-Greater than 50% of this time was spent in counseling, explanation of diagnosis, planning of further management, and coordination of care.  MEDICATIONS: Anti-infectives    None      Scheduled Meds: . albuterol  2.5 mg Nebulization TID  . ALPRAZolam  0.5 mg Oral BID  . DULoxetine  60 mg Oral QAC lunch  . furosemide  40 mg Oral Daily  . Influenza vac split quadrivalent PF  0.5 mL Intramuscular Tomorrow-1000  . insulin aspart  0-5 Units Subcutaneous QHS  . insulin aspart  0-9 Units Subcutaneous TID WC  . levothyroxine  175 mcg Oral QAC breakfast  . losartan  50 mg Oral Daily  . metoprolol tartrate  25 mg Oral BID  . nicotine  21 mg Transdermal Daily  . pantoprazole  40 mg Oral Daily  . pneumococcal 23 valent vaccine  0.5 mL Intramuscular Tomorrow-1000  . sodium chloride flush  3 mL Intravenous Q12H   Continuous Infusions:  PRN Meds:.acetaminophen **OR** acetaminophen, diclofenac sodium, gabapentin, traMADol, zolpidem   PHYSICAL EXAM: Vital signs: Vitals:   09/25/16 0224 09/25/16 0616 09/25/16 0746 09/25/16 0858  BP: 126/61 (!) 120/59  (!) 134/54  Pulse:  70 70  72  Resp:  20  20  Temp: 98 F (36.7 C) 98 F (36.7 C)  99.1 F (37.3 C)  TempSrc: Oral   Oral  SpO2: 94% 98% 95% 97%  Weight:       Filed Weights   09/24/16 2103  Weight: 86.2 kg (190 lb)   Body mass  index is 35.9 kg/m.   General appearance :Awake, alert, not in any distress. Speech Clear. Not toxic Looking Eyes:, pupils equally reactive to light and accomodation,no scleral icterus.Pink conjunctiva HEENT: Atraumatic and Normocephalic Neck: supple, no JVD. No cervical lymphadenopathy. No thyromegaly Resp:Good air entry bilaterally, no added sounds  CVS: S1 S2 regular, no murmurs.  GI: Bowel sounds present, Non tender and not distended with no gaurding, rigidity or rebound.No organomegaly Extremities: B/L Lower Ext shows no edema, both legs are warm to touch Neurology:  speech clear,Non focal, sensation is grossly intact. Psychiatric: Normal judgment and insight. Alert and oriented x 3. Normal mood. Musculoskeletal:gait appears to be normal.No digital cyanosis Skin:No Rash, warm and dry Wounds:N/A  I have personally reviewed following labs and imaging studies  LABORATORY DATA: CBC:  Recent Labs Lab 09/23/16 2016  09/24/16 0721 09/24/16 1324 09/24/16 1923 09/25/16 0306 09/25/16 0910  WBC 10.4  < > 9.3 8.6 8.6 8.8 10.3  NEUTROABS 6.1  --   --   --   --   --   --   HGB 6.1*  < > 7.2* 7.1* 6.8* 8.7* 8.8*  HCT 23.1*  < > 25.9* 25.5* 24.2* 29.3* 30.0*  MCV 70.0*  < > 70.8* 70.8* 70.6* 73.4* 72.5*  PLT 291  < > 272 290 300 296 277  < > = values in this interval not displayed.  Basic Metabolic Panel:  Recent Labs Lab 09/23/16 1757 09/23/16 1948 09/24/16 0721  NA 134*  --  136  K 3.4*  --  3.8  CL 101  --  103  CO2 21*  --  26  GLUCOSE 218*  --  171*  BUN <5*  --  <5*  CREATININE 1.09*  --  0.99  CALCIUM 8.8*  --  8.7*  MG  --  1.6*  --     GFR: CrCl cannot be calculated (Unknown ideal weight.).  Liver Function Tests:  Recent Labs Lab 09/24/16 1923  AST 51*  ALT 19  ALKPHOS 137*  BILITOT 0.9  PROT 6.3*  ALBUMIN 2.8*   No results for input(s): LIPASE, AMYLASE in the last 168 hours. No results for input(s): AMMONIA in the last 168 hours.  Coagulation  Profile:  Recent Labs Lab 09/24/16 0721  INR 1.70    Cardiac Enzymes: No results for input(s): CKTOTAL, CKMB, CKMBINDEX, TROPONINI in the last 168 hours.  BNP (last 3 results) No results for input(s): PROBNP in the last 8760 hours.  HbA1C: No results for input(s): HGBA1C in the last 72 hours.  CBG:  Recent Labs Lab 09/24/16 1150 09/24/16 1628 09/24/16 2217 09/25/16 0746 09/25/16 1214  GLUCAP 138* 141* 201* 121* 186*    Lipid Profile: No results for input(s): CHOL, HDL, LDLCALC, TRIG, CHOLHDL, LDLDIRECT in the last 72 hours.  Thyroid Function Tests: No results for input(s): TSH, T4TOTAL, FREET4, T3FREE, THYROIDAB in the last 72 hours.  Anemia Panel:  Recent Labs  09/24/16 1923  VITAMINB12 402  FERRITIN 9*  TIBC 435  IRON 25*    Urine analysis:    Component Value Date/Time   COLORURINE YELLOW 09/05/2015 1440  APPEARANCEUR CLEAR 09/05/2015 1440   LABSPEC 1.006 09/05/2015 1440   PHURINE 5.5 09/05/2015 1440   GLUCOSEU NEGATIVE 09/05/2015 1440   HGBUR NEGATIVE 09/05/2015 1440   BILIRUBINUR NEGATIVE 09/05/2015 1440   KETONESUR NEGATIVE 09/05/2015 1440   PROTEINUR NEGATIVE 09/05/2015 1440   UROBILINOGEN 1.0 09/05/2015 1440   NITRITE NEGATIVE 09/05/2015 1440   LEUKOCYTESUR NEGATIVE 09/05/2015 1440    Sepsis Labs: Lactic Acid, Venous    Component Value Date/Time   LATICACIDVEN 1.3 09/05/2015 2035    MICROBIOLOGY: No results found for this or any previous visit (from the past 240 hour(s)).  RADIOLOGY STUDIES/RESULTS: Ct Abdomen Pelvis Wo Contrast  Result Date: 09/24/2016 CLINICAL DATA:  Decreased hemoglobin.  Abdominal pain EXAM: CT ABDOMEN AND PELVIS WITHOUT CONTRAST TECHNIQUE: Multidetector CT imaging of the abdomen and pelvis was performed following the standard protocol without IV contrast. COMPARISON:  09/06/2015 FINDINGS: Lower chest: Atelectasis in the lung bases. Hepatobiliary: Enlarged lateral segment left lobe of liver with nodular contour  consistent with hepatic cirrhosis. Cholelithiasis with single stone in the gallbladder. No gallbladder wall thickening or edema. No bile duct dilatation. Pancreas: Mild fatty infiltration. Spleen: Unenhanced appearance is unremarkable. Adrenals/Urinary Tract: No adrenal gland nodules. No hydronephrosis or hydroureter. No renal, ureteral, or bladder stones. Bladder wall is not thickened. Stomach/Bowel: Stomach and small bowel are decompressed. Diffusely stool-filled colon without abnormal distention or wall thickening. Appendix is not identified. Vascular/Lymphatic: Aortic atherosclerosis. No enlarged abdominal or pelvic lymph nodes. Reproductive: Uterus and ovaries are not enlarged. Other: Abdominal wall musculature appears intact. No free air or free fluid in the abdomen. No abdominal, mesenteric, or retroperitoneal collections. Musculoskeletal: No acute or significant osseous findings. IMPRESSION: No acute process demonstrated on noncontrast imaging of the abdomen or pelvis. No evidence for hematoma. Changes of hepatic cirrhosis. Cholelithiasis. Electronically Signed   By: Lucienne Capers M.D.   On: 09/24/2016 02:21   Dg Chest 2 View  Result Date: 09/23/2016 CLINICAL DATA:  Shortness of breath upon exertion for 3-4 weeks worsening today. History of hypertension - on meds and diabetes. EXAM: CHEST  2 VIEW COMPARISON:  12/25/2015 FINDINGS: Heart size and pulmonary vascularity are normal for technique. Linear atelectasis in the lung bases. No focal airspace disease or consolidation in the lungs. No blunting of costophrenic angles. No pneumothorax. Central peribronchial thickening may indicate chronic bronchitis. IMPRESSION: Linear atelectasis in the lung bases. Chronic bronchitic changes. No evidence of active consolidation. Electronically Signed   By: Lucienne Capers M.D.   On: 09/23/2016 21:17     LOS: 0 days   Oren Binet, MD  [facility] Pager:[phone]  If 7PM-7AM, please contact  night-coverage [email or web address] Password [email or web address] 09/25/2016, 1:48 PM

## 2016-09-25 NOTE — Evaluation (Signed)
Occupational Therapy Evaluation Patient Details Name: Rhonda Santos MRN: [id number] DOB: March 03, 1946 Today's Date: 09/25/2016    History of Present Illness Pt adm with weakness and SOB and found to have Hgb of 6.4. PMH -PE on Xarelto, hypertension, hyperlipidemia, diabetes mellitus, GERD, hypothyroidism, depression, anxiety, DDD.   Clinical Impression   Pt was performing ADL and IADL at a modified independent level prior to admission. Overall, performing ADL and mobility at a supervision level. Pt with audible wheezing and crepitus in B knees. Will follow acutely to instruct in energy conservation and home safety/fall prevention with ADL transfers.     Follow Up Recommendations  No OT follow up    Equipment Recommendations   (4 wheel walker)    Recommendations for Other Services       Precautions / Restrictions Precautions Precautions: Fall      Mobility Bed Mobility Overal bed mobility: Modified Independent             General bed mobility comments: Incr time  Transfers Overall transfer level: Needs assistance Equipment used: 1 person hand held assist Transfers: Sit to/from Stand Sit to Stand: Supervision         General transfer comment: Assist for safety, no dizziness reported, instructed to stand several seconds to assess dizziness    Balance Overall balance assessment: Needs assistance   Sitting balance-Leahy Scale: Good       Standing balance-Leahy Scale: Fair                              ADL Overall ADL's : Needs assistance/impaired Eating/Feeding: Independent;Sitting   Grooming: Oral care;Wash/dry hands;Wash/dry face;Brushing hair;Sitting;Standing;Set up;Supervision/safety   Upper Body Bathing: Set up;Sitting   Lower Body Bathing: Supervison/ safety;Sit to/from stand   Upper Body Dressing : Set up;Sitting   Lower Body Dressing: Supervision/safety;Sit to/from stand   Toilet Transfer: Min guard;Ambulation   Toileting-  Clothing Manipulation and Hygiene: Supervision/safety;Sit to/from stand       Functional mobility during ADLs: Min guard (no device used in room) General ADL Comments: Pt sponge bathed and dressed seated at sink, stood for part of grooming. Pt with wheezing. Reports arthritis in knees and osteoporosis.      Vision     Perception     Praxis      Pertinent Vitals/Pain Pain Assessment: Faces Faces Pain Scale: Hurts little more Pain Location: knees Pain Descriptors / Indicators: Sore (arthritic pain) Pain Intervention(s): Monitored during session;Repositioned     Hand Dominance Right   Extremity/Trunk Assessment Upper Extremity Assessment Upper Extremity Assessment: Generalized weakness (arthritic changes in hands)   Lower Extremity Assessment Lower Extremity Assessment: Defer to PT evaluation (crepitus in knees)       Communication Communication Communication: No difficulties   Cognition Arousal/Alertness: Awake/alert Behavior During Therapy: WFL for tasks assessed/performed Overall Cognitive Status: Within Functional Limits for tasks assessed                     General Comments       Exercises       Shoulder Instructions      Home Living Family/patient expects to be discharged to:: Private residence Living Arrangements: Children (and son's fiance) Available Help at Discharge: Family;Available PRN/intermittently Type of Home: House Home Access: Ramped entrance     Home Layout: One level     Bathroom Shower/Tub: Tub/shower unit;Curtain   Biochemist, clinical: Standard     Home Equipment:  Walker - 2 wheels;Cane - single point;Bedside commode;Tub bench;Wheelchair - manual          Prior Functioning/Environment Level of Independence: Independent with assistive device(s)        Comments: Uses cane         OT Problem List: Decreased activity tolerance;Impaired balance (sitting and/or standing);Decreased knowledge of use of DME or  AE;Cardiopulmonary status limiting activity   OT Treatment/Interventions: Self-care/ADL training;Energy conservation;Patient/family education;Balance training;DME and/or AE instruction    OT Goals(Current goals can be found in the care plan section) Acute Rehab OT Goals Patient Stated Goal: return home and be able to walk outside OT Goal Formulation: With patient Time For Goal Achievement: 10/02/16 Potential to Achieve Goals: Good ADL Goals Pt Will Perform Grooming: with modified independence;standing Pt Will Transfer to Toilet: with modified independence;ambulating;bedside commode (over toilet) Pt Will Perform Toileting - Clothing Manipulation and hygiene: with modified independence;sit to/from stand Pt Will Perform Tub/Shower Transfer: Tub transfer;with modified independence;ambulating;shower seat Additional ADL Goal #1: Pt will state 3 energy conservation strategies after instruction. Additional ADL Goal #2: Pt will gather items around room necessary for ADL modified independently.  OT Frequency: Min 2X/week   Barriers to D/C:            Co-evaluation              End of Session Nurse Communication:  (ok to give diet sprite)  Activity Tolerance: Patient limited by fatigue Patient left: in chair;with call bell/phone within reach   Time: 1000-1033 OT Time Calculation (min): 33 min Charges:  OT General Charges $OT Visit: 1 Procedure OT Evaluation $OT Eval Moderate Complexity: 1 Procedure OT Treatments $Self Care/Home Management : 8-22 mins G-Codes: OT G-codes **NOT FOR INPATIENT CLASS** Functional Assessment Tool Used: clinical judgement Functional Limitation: Self care Self Care Current Status [id number]): At least 1 percent but less than 20 percent impaired, limited or restricted Self Care Goal Status [id number]): At least 1 percent but less than 20 percent impaired, limited or restricted  [name] 09/25/2016, 10:49 AM  [phone]

## 2016-09-26 ENCOUNTER — Encounter (HOSPITAL_COMMUNITY)
Admission: EM | Disposition: A | Discharge status: Home / Self Care | Payer: Self-pay | Source: Home / Self Care | Attending: Internal Medicine

## 2016-09-26 ENCOUNTER — Observation Stay (HOSPITAL_COMMUNITY): Payer: Medicare Other | Admitting: Anesthesiology

## 2016-09-26 ENCOUNTER — Encounter (HOSPITAL_COMMUNITY): Payer: Self-pay | Admitting: Anesthesiology

## 2016-09-26 DIAGNOSIS — D509 Iron deficiency anemia, unspecified: Principal | ICD-10-CM

## 2016-09-26 DIAGNOSIS — Z538 Procedure and treatment not carried out for other reasons: Secondary | ICD-10-CM

## 2016-09-26 DIAGNOSIS — I10 Essential (primary) hypertension: Secondary | ICD-10-CM | POA: Diagnosis not present

## 2016-09-26 DIAGNOSIS — K621 Rectal polyp: Secondary | ICD-10-CM

## 2016-09-26 DIAGNOSIS — D649 Anemia, unspecified: Secondary | ICD-10-CM | POA: Diagnosis not present

## 2016-09-26 DIAGNOSIS — D122 Benign neoplasm of ascending colon: Secondary | ICD-10-CM | POA: Diagnosis not present

## 2016-09-26 DIAGNOSIS — E1165 Type 2 diabetes mellitus with hyperglycemia: Secondary | ICD-10-CM | POA: Diagnosis not present

## 2016-09-26 DIAGNOSIS — E876 Hypokalemia: Secondary | ICD-10-CM | POA: Diagnosis not present

## 2016-09-26 DIAGNOSIS — K635 Polyp of colon: Secondary | ICD-10-CM | POA: Diagnosis not present

## 2016-09-26 HISTORY — PX: COLONOSCOPY: SHX5424

## 2016-09-26 HISTORY — PX: ESOPHAGOGASTRODUODENOSCOPY: SHX5428

## 2016-09-26 LAB — CBC
HCT: 32.3 % — ABNORMAL LOW (ref 36.0–46.0)
Hemoglobin: 9.4 g/dL — ABNORMAL LOW (ref 12.0–15.0)
MCH: 21.5 pg — AB (ref 26.0–34.0)
MCHC: 29.1 g/dL — AB (ref 30.0–36.0)
MCV: 73.9 fL — AB (ref 78.0–100.0)
PLATELETS: 296 10*3/uL (ref 150–400)
RBC: 4.37 MIL/uL (ref 3.87–5.11)
RDW: 20.5 % — AB (ref 11.5–15.5)
WBC: 9.4 10*3/uL (ref 4.0–10.5)

## 2016-09-26 LAB — HEPATITIS C ANTIBODY

## 2016-09-26 LAB — HEPATITIS PANEL, ACUTE
Hep A IgM: NEGATIVE
Hep B C IgM: NEGATIVE
Hepatitis B Surface Ag: NEGATIVE

## 2016-09-26 LAB — GLUCOSE, CAPILLARY
GLUCOSE-CAPILLARY: 149 mg/dL — AB (ref 65–99)
GLUCOSE-CAPILLARY: 118 mg/dL — AB (ref 65–99)
GLUCOSE-CAPILLARY: 112 mg/dL — AB (ref 65–99)
Glucose-Capillary: 119 mg/dL — ABNORMAL HIGH (ref 65–99)

## 2016-09-26 LAB — HCV COMMENT:

## 2016-09-26 LAB — HEPATITIS B SURFACE ANTIBODY,QUALITATIVE: Hep B S Ab: NONREACTIVE

## 2016-09-26 LAB — ANTINUCLEAR ANTIBODIES, IFA: ANTINUCLEAR ANTIBODIES, IFA: NEGATIVE

## 2016-09-26 LAB — ANTI-SMOOTH MUSCLE ANTIBODY, IGG: F-Actin IgG: 29 Units — ABNORMAL HIGH (ref 0–19)

## 2016-09-26 LAB — HEPATITIS A ANTIBODY, TOTAL: HEP A TOTAL AB: POSITIVE — AB

## 2016-09-26 LAB — MITOCHONDRIAL ANTIBODIES: MITOCHONDRIAL M2 AB, IGG: 9.4 U (ref 0.0–20.0)

## 2016-09-26 LAB — HEPATITIS B SURFACE ANTIBODY, QUANTITATIVE

## 2016-09-26 LAB — HEPATITIS B SURFACE ANTIGEN: HEP B S AG: NEGATIVE

## 2016-09-26 LAB — HEPATITIS C ANTIBODY (REFLEX): HCV Ab: <0.1 s/co ratio (ref 0.0–0.9)

## 2016-09-26 SURGERY — COLONOSCOPY
Anesthesia: Monitor Anesthesia Care

## 2016-09-26 MED ORDER — PEG 3350-KCL-NABCB-NACL-NASULF 236 G PO SOLR
4000.0000 mL | Freq: Once | ORAL | Status: DC
  Filled 2016-09-26: qty 4000

## 2016-09-26 MED ORDER — LACTATED RINGERS IV SOLN
INTRAVENOUS | Status: DC
  Administered 2016-09-26: 12:00:00 via INTRAVENOUS

## 2016-09-26 MED ORDER — BACITRACIN ZINC 500 UNIT/GM EX OINT
TOPICAL_OINTMENT | CUTANEOUS | Status: AC
  Filled 2016-09-26: qty 0.9

## 2016-09-26 MED ORDER — PEG-KCL-NACL-NASULF-NA ASC-C 100 G PO SOLR: 1.0000 | Freq: Once | ORAL | Status: DC

## 2016-09-26 MED ORDER — SODIUM CHLORIDE 0.9 % IV SOLN: INTRAVENOUS | Status: DC

## 2016-09-26 MED ORDER — FENTANYL CITRATE (PF) 100 MCG/2ML IJ SOLN: 25.0000 ug | INTRAMUSCULAR | Status: DC | PRN

## 2016-09-26 MED ORDER — PEG-KCL-NACL-NASULF-NA ASC-C 100 G PO SOLR
0.5000 | Freq: Once | ORAL | Status: AC
  Administered 2016-09-26: 100 g via ORAL
  Filled 2016-09-26 (×2): qty 1

## 2016-09-26 MED ORDER — LACTATED RINGERS IV SOLN
INTRAVENOUS | Status: DC | PRN
  Administered 2016-09-26: 13:00:00 via INTRAVENOUS

## 2016-09-26 MED ORDER — PEG-KCL-NACL-NASULF-NA ASC-C 100 G PO SOLR
0.5000 | Freq: Once | ORAL | Status: DC
  Filled 2016-09-26: qty 1

## 2016-09-26 MED ORDER — LIDOCAINE HCL (CARDIAC) 20 MG/ML IV SOLN
INTRAVENOUS | Status: DC | PRN
  Administered 2016-09-26: 50 mg via INTRATRACHEAL

## 2016-09-26 MED ORDER — PROPOFOL 10 MG/ML IV BOLUS
INTRAVENOUS | Status: DC | PRN
  Administered 2016-09-26 (×2): 20 mg via INTRAVENOUS
  Administered 2016-09-26: 30 mg via INTRAVENOUS
  Administered 2016-09-26: 20 mg via INTRAVENOUS
  Administered 2016-09-26: 30 mg via INTRAVENOUS
  Administered 2016-09-26 (×2): 50 mg via INTRAVENOUS

## 2016-09-26 MED ORDER — PEG-KCL-NACL-NASULF-NA ASC-C 100 G PO SOLR
0.5000 | Freq: Once | ORAL | Status: AC
  Administered 2016-09-27: 100 g via ORAL
  Filled 2016-09-26: qty 1

## 2016-09-26 NOTE — Progress Notes (Signed)
Physical Therapy Treatment Patient Details Name: Rhonda Santos MRN: [id number] DOB: 18-Dec-1946 Today's Date: 10-10-16    History of Present Illness Pt adm with weakness and SOB and found to have Hgb of 6.4. PMH -PE on Xarelto, hypertension, hyperlipidemia, diabetes mellitus, GERD, hypothyroidism, depression, anxiety, DDD.    PT Comments    Making good progress with mobility and gait.  Follow Up Recommendations  No PT follow up;Supervision - Intermittent     Equipment Recommendations  Other (comment) (rollator)    Recommendations for Other Services       Precautions / Restrictions Precautions Precautions: Fall Restrictions Weight Bearing Restrictions: No    Mobility  Bed Mobility Overal bed mobility: Modified Independent                Transfers Overall transfer level: Modified independent   Transfers: Sit to/from Stand Sit to Stand: Modified independent (Device/Increase time)         General transfer comment: Patient walking in room on her own to bathroom  Ambulation/Gait Ambulation/Gait assistance: Min guard Ambulation Distance (Feet): 200 Feet Assistive device: Rolling walker (2 wheeled);None Gait Pattern/deviations: Step-through pattern;Decreased stride length;Trunk flexed Gait velocity: decreased Gait velocity interpretation: Below normal speed for age/gender General Gait Details: Patient able to ambulate short distances in room with no assistive device.  Patient using RW for longer distances for balance.   Stairs            Wheelchair Mobility    Modified Rankin (Stroke Patients Only)       Balance     Sitting balance-Leahy Scale: Good       Standing balance-Leahy Scale: Good                      Cognition Arousal/Alertness: Awake/alert Behavior During Therapy: WFL for tasks assessed/performed Overall Cognitive Status: Within Functional Limits for tasks assessed                      Exercises       General Comments        Pertinent Vitals/Pain Pain Assessment: Faces Faces Pain Scale: Hurts little more Pain Location: Generalized Pain Descriptors / Indicators: Aching Pain Intervention(s): Monitored during session;Repositioned    Home Living                      Prior Function            PT Goals (current goals can now be found in the care plan section) Acute Rehab PT Goals Patient Stated Goal: return home and be able to walk outside Progress towards PT goals: Progressing toward goals    Frequency    Min 3X/week      PT Plan Current plan remains appropriate    Co-evaluation             End of Session Equipment Utilized During Treatment: Gait belt Activity Tolerance: Patient tolerated treatment well Patient left: in bed;with call bell/phone within reach     Time: [id number] PT Time Calculation (min) (ACUTE ONLY): 13 min  Charges:  $Gait Training: 8-22 mins                    G Codes:      Despina Pole 10-10-16, 11:50 AM Carita Pian. Sanjuana Kava, [place] Pager [phone]

## 2016-09-26 NOTE — Anesthesia Postprocedure Evaluation (Signed)
Anesthesia Post Note  Patient: Rhonda Santos  Procedure(s) Performed: Procedure(s) (LRB): COLONOSCOPY (N/A) ESOPHAGOGASTRODUODENOSCOPY (EGD) (N/A)  Patient location during evaluation: PACU Anesthesia Type: MAC Level of consciousness: awake and alert Pain management: pain level controlled Vital Signs Assessment: post-procedure vital signs reviewed and stable Respiratory status: spontaneous breathing, nonlabored ventilation, respiratory function stable and patient connected to nasal cannula oxygen Cardiovascular status: stable and blood pressure returned to baseline Anesthetic complications: no    Last Vitals:  Vitals:   09/26/16 1340 09/26/16 1344  BP: (!) 136/43   Pulse: 71 73  Resp: 16 18  Temp:      Last Pain:  Vitals:   09/26/16 1321  TempSrc: Oral  PainSc:                  Sondi Desch,W. EDMOND

## 2016-09-26 NOTE — Transfer of Care (Signed)
Immediate Anesthesia Transfer of Care Note  Patient: Rhonda Santos  Procedure(s) Performed: Procedure(s): COLONOSCOPY (N/A) ESOPHAGOGASTRODUODENOSCOPY (EGD) (N/A)  Patient Location: Endoscopy Unit  Anesthesia Type:MAC  Level of Consciousness: awake, alert  and oriented  Airway & Oxygen Therapy: Patient Spontanous Breathing and Patient connected to nasal cannula oxygen  Post-op Assessment: Report given to RN, Post -op Vital signs reviewed and stable and Patient moving all extremities X 4  Post vital signs: Reviewed and stable  Last Vitals:  Vitals:   09/26/16 1000 09/26/16 1155  BP: (!) 147/58 (!) 130/42  Pulse: 77 70  Resp: 18 (!) 21  Temp: 37 C (!) 38 C    Last Pain:  Vitals:   09/26/16 1155  TempSrc: Oral  PainSc:          Complications: No apparent anesthesia complications

## 2016-09-26 NOTE — Progress Notes (Signed)
Occupational Therapy Treatment Patient Details Name: Rhonda Santos MRN: [id number] DOB: 1946-02-02 Today's Date: 09/26/2016    History of present illness Pt adm with weakness and SOB and found to have Hgb of 6.4. PMH -PE on Xarelto, hypertension, hyperlipidemia, diabetes mellitus, GERD, hypothyroidism, depression, anxiety, DDD.   OT comments  Focus of session on standing grooming, tub transfer and education in energy conservation. Pt progressing well. No complaints of dizziness when up. Eager to have colonoscopy Santos she can drink.  Follow Up Recommendations  No OT follow up    Equipment Recommendations       Recommendations for Other Services      Precautions / Restrictions Precautions Precautions: Fall Restrictions Weight Bearing Restrictions: No       Mobility Bed Mobility Overal bed mobility: Modified Independent                Transfers Overall transfer level: Needs assistance   Transfers: Sit to/from Stand Sit to Stand: Modified independent (Device/Increase time)         General transfer comment: from bed    Balance     Sitting balance-Leahy Scale: Good       Standing balance-Leahy Scale: Fair                     ADL Overall ADL's : Needs assistance/impaired     Grooming: Oral care;Wash/dry hands;Wash/dry face;Standing;Modified independent                     Armed forces technical officer Details (indicate cue type and reason): reports walking back and forth to the bathroom all night without assist as she prepared for colonoscopy     Tub/ Shower Transfer: Supervision/safety;Ambulation Tub/Shower Transfer Details (indicate cue type and reason): simulated over trash can Functional mobility during ADLs: Supervision/safety (no device, within room) General ADL Comments: pt c/o dry mouth as she is NPO. Educated in energy conservation strategies and reinforced with handout.      Vision                     Perception     Praxis       Cognition   Behavior During Therapy: WFL for tasks assessed/performed Overall Cognitive Status: Within Functional Limits for tasks assessed                       Extremity/Trunk Assessment               Exercises     Shoulder Instructions       General Comments      Pertinent Vitals/ Pain       Pain Assessment: Faces Faces Pain Scale: Hurts little more Pain Location: knees--chronic Pain Descriptors / Indicators:  (arthritic) Pain Intervention(s): Monitored during session;Repositioned  Home Living                                          Prior Functioning/Environment              Frequency  Min 2X/week        Progress Toward Goals  OT Goals(current goals can now be found in the care plan section)  Progress towards OT goals: Progressing toward goals  Acute Rehab OT Goals Patient Stated Goal: return home and be able to walk outside Time For Goal Achievement: 10/02/16 Potential  to Achieve Goals: Good  Plan Discharge plan remains appropriate    Co-evaluation                 End of Session     Activity Tolerance Patient tolerated treatment well   Patient Left in bed;with call bell/phone within reach   Nurse Communication          Time: [id number] OT Time Calculation (min): 14 min  Charges: OT General Charges $OT Visit: 1 Procedure OT Treatments $Self Care/Home Management : 8-22 mins  Rhonda Santos 09/26/2016, 11:36 AM [phone]

## 2016-09-26 NOTE — Op Note (Addendum)
[facility] Patient Name: Rhonda Santos Procedure Date : 09/26/2016 MRN: [id number] Attending MD: Mauri Pole , MD Date of Birth: 09/30/1946 CSN: [id number] Age: 70 Admit Type: Inpatient Procedure:                Colonoscopy Indications:              Unexplained iron deficiency anemia Providers:                Mauri Pole, MD, Carolynn Comment, RN,                            Elspeth Cho Tech., Technician, Elige Ko                            CRNA, CRNA Referring MD:              Medicines:                Monitored Anesthesia Care Complications:            No immediate complications. Estimated Blood Loss:     Estimated blood loss was minimal. Procedure:                Pre-Anesthesia Assessment:                           - Prior to the procedure, a History and Physical                            was performed, and patient medications and                            allergies were reviewed. [name] tolerance of                            previous anesthesia was also reviewed. The risks                            and benefits of the procedure and the sedation                            options and risks were discussed with the patient.                            All questions were answered, and informed consent                            was obtained. Prior Anticoagulants: The patient                            last took Xarelto (rivaroxaban) 2 days prior to the                            procedure. ASA Grade Assessment: IV - A patient  with severe systemic disease that is a constant                            threat to life. After reviewing the risks and                            benefits, the patient was deemed in satisfactory                            condition to undergo the procedure.                           - Prior to the procedure, a History and Physical                            was performed, and patient  medications and                            allergies were reviewed. [name] tolerance of                            previous anesthesia was also reviewed. The risks                            and benefits of the procedure and the sedation                            options and risks were discussed with the patient.                            All questions were answered, and informed consent                            was obtained. Prior Anticoagulants: The patient                            last took Xarelto (rivaroxaban) 2 days prior to the                            procedure. ASA Grade Assessment: IV - A patient                            with severe systemic disease that is a constant                            threat to life. After reviewing the risks and                            benefits, the patient was deemed in satisfactory                            condition to undergo the procedure.  After obtaining informed consent, the colonoscope                            was passed under direct vision. Throughout the                            procedure, the patient's blood pressure, pulse, and                            oxygen saturations were monitored continuously. The                            [id number]) scope was introduced through                            the anus and advanced to the the terminal ileum,                            with identification of the appendiceal orifice and                            IC valve. The colonoscopy was technically difficult                            and complex due to poor bowel prep with stool                            present. The patient tolerated the procedure well.                            The quality of the bowel preparation was poor. The                            ileocecal valve and the rectum were photographed. Scope In: 12:59:00 PM Scope Out: 1:12:00 PM Scope Withdrawal Time: 0 hours 6  minutes 0 seconds  Total Procedure Duration: 0 hours 13 minutes 0 seconds  Findings:      The perianal and digital rectal examinations were normal.      Two sessile polyps were found in the rectum and ascending colon. The       polyps were 4 to 7 mm in size. These polyps were removed with a cold       snare. Resection and retrieval were complete.      A large amount of stool was found in the entire colon, interfering with       visualization. Impression:               - Preparation of the colon was poor.                           - Two 4 to 7 mm polyps in the rectum and in the                            ascending colon, removed with a cold snare.  Resected and retrieved.                           - Stool in the entire examined colon. Moderate Sedation:      N/A Recommendation:           - Clear liquid diet today.                           - Continue present medications.                           - Await pathology results.                           - Repeat colonoscopy tomorrow because the bowel                            preparation was poor. Procedure Code(s):        --- Professional ---                           [phone], Colonoscopy, flexible; with removal of                            tumor(s), polyp(s), or other lesion(s) by snare                            technique Diagnosis Code(s):        --- Professional ---                           K62.1, Rectal polyp                           D12.2, Benign neoplasm of ascending colon                           D50.9, Iron deficiency anemia, unspecified CPT copyright 2016 [employer]. All rights reserved. The codes documented in this report are preliminary and upon coder review may  be revised to meet current compliance requirements. Mauri Pole, MD 09/26/2016 1:26:41 PM This report has been signed electronically. Number of Addenda: 0

## 2016-09-26 NOTE — Op Note (Signed)
[facility] Patient Name: Rhonda Santos Procedure Date : 09/26/2016 MRN: [id number] Attending MD: Mauri Pole , MD Date of Birth: 08/27/1946 CSN: [id number] Age: 70 Admit Type: Inpatient Procedure:                Upper GI endoscopy Indications:              Suspected upper gastrointestinal bleeding in                            patient with unexplained iron deficiency anemia Providers:                Mauri Pole, MD, Carolynn Comment, RN, Neldon Newport CRNA, CRNA, Otilio Saber,                            Technician Referring MD:              Medicines:                Monitored Anesthesia Care Complications:            No immediate complications. Estimated Blood Loss:     Estimated blood loss: none. Procedure:                Pre-Anesthesia Assessment:                           - Prior to the procedure, a History and Physical                            was performed, and patient medications and                            allergies were reviewed. [name] tolerance of                            previous anesthesia was also reviewed. The risks                            and benefits of the procedure and the sedation                            options and risks were discussed with the patient.                            All questions were answered, and informed consent                            was obtained. Prior Anticoagulants: The patient                            last took Xarelto (rivaroxaban) 2 days prior to the  procedure. ASA Grade Assessment: IV - A patient                            with severe systemic disease that is a constant                            threat to life. After reviewing the risks and                            benefits, the patient was deemed in satisfactory                            condition to undergo the procedure.                           After obtaining  informed consent, the endoscope was                            passed under direct vision. Throughout the                            procedure, the patient's blood pressure, pulse, and                            oxygen saturations were monitored continuously. The                            [id number]) scope was introduced through the                            mouth, and advanced to the second part of duodenum.                            The upper GI endoscopy was accomplished without                            difficulty. The patient tolerated the procedure                            well. Scope In: Scope Out: Findings:      The esophagus was normal.      The stomach was normal.      The examined duodenum was normal. Impression:               - Normal esophagus.                           - Normal stomach.                           - Normal examined duodenum.                           - No specimens collected. Moderate Sedation:      N/A Recommendation:           -  Clear liquid diet today.                           - Continue present medications.                           - Await pathology results.                           - Repeat upper endoscopy tomorrow because the                            preparation was poor. Procedure Code(s):        --- Professional ---                           [phone], Esophagogastroduodenoscopy, flexible,                            transoral; diagnostic, including collection of                            specimen(s) by brushing or washing, when performed                            (separate procedure) Diagnosis Code(s):        --- Professional ---                           D50.9, Iron deficiency anemia, unspecified CPT copyright 2016 American [employer] Association. All rights reserved. The codes documented in this report are preliminary and upon coder review may  be revised to meet current compliance requirements. Mauri Pole, MD 09/26/2016  1:23:43 PM This report has been signed electronically. Number of Addenda: 0

## 2016-09-26 NOTE — Interval H&P Note (Signed)
History and Physical Interval Note:  09/26/2016 12:37 PM  Rhonda Santos  has presented today for surgery, with the diagnosis of Iron deficiency anemia. Cirrhosis. FOBT negative.  The various methods of treatment have been discussed with the patient and family. After consideration of risks, benefits and other options for treatment, the patient has consented to  Procedure(s): COLONOSCOPY (N/A) ESOPHAGOGASTRODUODENOSCOPY (EGD) (N/A) as a surgical intervention .  [name] history has been reviewed, patient examined, no change in status, stable for surgery.  I have reviewed the patient's chart and labs.  Questions were answered to the patient's satisfaction.     Allesha Aronoff

## 2016-09-26 NOTE — Anesthesia Preprocedure Evaluation (Addendum)
Anesthesia Evaluation  Patient identified by MRN, date of birth, ID band Patient awake    Reviewed: Allergy & Precautions, NPO status , Patient's Chart, lab work & pertinent test results  History of Anesthesia Complications Negative for: history of anesthetic complications  Airway Mallampati: II  TM Distance: >3 FB Neck ROM: Full    Dental  (+) Edentulous Upper, Edentulous Lower, Upper Dentures   Pulmonary COPD,  COPD inhaler, Current Smoker, PE (Xarelto)   breath sounds clear to auscultation       Cardiovascular hypertension, Pt. on medications and Pt. on home beta blockers (-) angina+ dysrhythmias (long QT) + Valvular Problems/Murmurs MVP  Rhythm:Regular Rate:Normal  '16 ECHO: EF 55-60%, valves OK   Neuro/Psych PSYCHIATRIC DISORDERS negative neurological ROS     GI/Hepatic negative GI ROS, Neg liver ROS, GERD  Medicated and Controlled,  Endo/Other  diabetes (glu 149), Type 2, Oral Hypoglycemic AgentsHypothyroidism Morbid obesity  Renal/GU Renal InsufficiencyRenal disease     Musculoskeletal   Abdominal (+) + obese,   Peds  Hematology  (+) Blood dyscrasia (Hb 9.4), anemia ,   Anesthesia Other Findings   Reproductive/Obstetrics                           Anesthesia Physical Anesthesia Plan  ASA: III  Anesthesia Plan: MAC   Post-op Pain Management:    Induction: Intravenous  Airway Management Planned: Natural Airway and Simple Face Mask  Additional Equipment:   Intra-op Plan:   Post-operative Plan:   Informed Consent: I have reviewed the patients History and Physical, chart, labs and discussed the procedure including the risks, benefits and alternatives for the proposed anesthesia with the patient or authorized representative who has indicated his/her understanding and acceptance.   Dental Advisory Given  Plan Discussed with: CRNA, Surgeon and Anesthesiologist  Anesthesia Plan  Comments: (Plan routine monitors, MAC )       Anesthesia Quick Evaluation

## 2016-09-26 NOTE — H&P (View-Only) (Signed)
[facility] Consult: 9:51 AM 09/25/2016  LOS: 0 days    Referring Provider: Dr Sloan Leiter  Primary Care Physician:  [facility] Primary Gastroenterologist:  unassigned   Reason for Consultation:  Microcytic anemia and cirrhosis.    HPI: Rhonda Santos is a 70 y.o. female.  PMH: hx hyperthyroidism (?[place]) and s/p thyroidectomy with subsequent hypothyroidism.  Hx PE 06/2015, on Xarelto, pt says she did not have DVT.  Depression, anxiety.  Prolonged QT in setting of hypokalemia 2014.  Opioid induced resp failure and encephalopathy in 2014.  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 Chronic pain, DDD.  HTN.  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 Fatty liver with elevated LFTs in 2014, cirrhosis seen on CT as of 08/2015.  Stage 3 CKD.  Normocytic anemia. COPD.  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  Diastolic heart failure, EF 55 to 60 %.   Listed meds include Diclofenac gel , Ibuprofen, prn Omeprazole. She only the gel and omeprazole, not Ibuprofen.  No narcotics/opiates either.    Present to ED on advisement of PMD with weakness, dyspnea for ~ 3 days.  Mild lower abdominal pain.  No N/V, diarrhea.  Hgb 6.1, MCV 70 .  Hgb 11.2, MCV 93 in 12/2015.  Iron low and ferritin 9 Albumin 2.8.  AST/ALT 51/19.  T bili 0.9.  Alk phos 137.   coags 20/1.7. Non-contrasted CT shows cirrhosis, no suspicious liver masses.  Solitary gallstone.    Hemoglobin is now 8.8 following transfusion PRBCs x 2.  Pt never told before now of the cirrhosis dx.  Patient has been a lifelong teetotaler. Never used any illicit drugs or participated in behaviors which would put her at risk for HIV or hepatitis. Remote screening colonoscopy in  20 or more years ago, no pathology  per pt recall.  Periodic heartburn from specific foods.  No dysphagia.  Good appetite.  Weight stable. No new abdominal swelling but has stable, marked central obesity.  No BPR, melena.  No constipation, diarrhea, altered bowel habits. Dad died in his early 43s of a cancer, she does not know type.    Past Medical History:  Diagnosis Date  . DDD (degenerative disc disease)    With chronic pain  . Diabetes mellitus without complication ([place])   . Diastolic dysfunction 01/5851  . Gallstone   . Hyperlipidemia   . Hypertension   . Hypokalemia 10/06/2013   Secondary to lasix  . Hypoxia 10/07/2013   From mild resp depression from opioids  . Mitral valve prolapse 09/2013   Mild per echo  . Osteoporosis   . Prolonged Q-T interval on ECG 10/07/2013   Secondary to hypokalemia  . Psychosis   . Pulmonary embolism ([place])    dx 2016  . Thyroid disease   . Tobacco abuse 10/07/2013    Past Surgical History:  Procedure Laterality Date  . THYROID SURGERY      Prior to Admission medications   Medication Sig Start Date End Date Taking? Authorizing Provider  albuterol (PROVENTIL  HFA;VENTOLIN HFA) 108 (90 Base) MCG/ACT inhaler Take 2 puffs 3 times daily for 5 more days and then every 6 hours as needed thereafter. Patient taking differently: Inhale 2 puffs into the lungs every 6 (six) hours as needed for wheezing or shortness of breath.  12/27/15  Yes Rexene Alberts, MD  albuterol (PROVENTIL) (2.5 MG/3ML) 0.083% nebulizer solution Take 2.5 mg by nebulization 3 (three) times daily. 08/19/16  Yes Historical Provider, MD  ALPRAZolam Duanne Moron) 0.5 MG tablet Take 1 tablet (0.5 mg total) by mouth 3 (three) times daily. Patient taking differently: Take 0.5 mg by mouth 2 (two) times daily.  06/30/14  Yes Cloria Spring, MD  diclofenac sodium (VOLTAREN) 1 % GEL Apply 1 application topically 3 (three) times daily as needed (pain).  08/16/16  Yes Historical Provider, MD  DULoxetine (CYMBALTA) 60 MG capsule Take 60 mg by  mouth daily before lunch.  06/08/15  Yes Historical Provider, MD  furosemide (LASIX) 40 MG tablet Take 40 mg by mouth daily.    Yes Historical Provider, MD  gabapentin (NEURONTIN) 400 MG capsule Take 800 mg by mouth every 6 (six) hours as needed (pain).  09/09/16  Yes Historical Provider, MD  glimepiride (AMARYL) 1 MG tablet For borderline diabetes. Take this medication daily if your blood sugar is greater than 160. Check your blood sugar daily. Patient taking differently: Take 1 mg by mouth daily as needed (CBG >160). For borderline diabetes. 12/27/15  Yes Rexene Alberts, MD  ibuprofen (ADVIL,MOTRIN) 200 MG tablet Take 400 mg by mouth daily as needed (pain).   Yes Historical Provider, MD  levothyroxine (SYNTHROID, LEVOTHROID) 175 MCG tablet Take 175 mcg by mouth daily before breakfast.  09/28/13  Yes Historical Provider, MD  losartan (COZAAR) 50 MG tablet Take 50 mg by mouth daily. 08/07/16  Yes Historical Provider, MD  metFORMIN (GLUCOPHAGE) 500 MG tablet Take 500 mg by mouth 2 (two) times daily. 09/13/16  Yes Historical Provider, MD  metoprolol tartrate (LOPRESSOR) 25 MG tablet Take 25 mg by mouth 2 (two) times daily.  08/24/13  Yes Historical Provider, MD  omeprazole (PRILOSEC) 40 MG capsule Take 40 mg by mouth daily as needed (heartburn/ acid reflux).  09/28/13  Yes Historical Provider, MD  potassium chloride (K-DUR) 10 MEQ tablet Take 10 mEq by mouth 2 (two) times daily.    Yes Historical Provider, MD  rivaroxaban (XARELTO) 20 MG TABS tablet Take 20 mg by mouth daily with breakfast.    Yes Historical Provider, MD  Tiotropium Bromide-Olodaterol (STIOLTO RESPIMAT) 2.5-2.5 MCG/ACT AERS Inhale 1 puff into the lungs daily as needed (shortness of breath).   Yes Historical Provider, MD  traMADol (ULTRAM) 50 MG tablet Take 100 mg by mouth every 6 (six) hours as needed (pain).  09/09/16  Yes Historical Provider, MD  Vitamin D, Ergocalciferol, (DRISDOL) 50000 UNITS CAPS capsule Take 50,000 Units by mouth every  Monday.  07/17/15  Yes Historical Provider, MD  amLODipine (NORVASC) 2.5 MG tablet Take 1 tablet (2.5 mg total) by mouth daily. Patient not taking: Reported on 09/23/2016 09/07/15   Orson Eva, MD    Scheduled Meds: . albuterol  2.5 mg Nebulization TID  . ALPRAZolam  0.5 mg Oral BID  . DULoxetine  60 mg Oral QAC lunch  . furosemide  40 mg Oral Daily  . Influenza vac split quadrivalent PF  0.5 mL Intramuscular Tomorrow-1000  . insulin aspart  0-5 Units Subcutaneous QHS  . insulin aspart  0-9 Units Subcutaneous TID WC  . levothyroxine  175 mcg Oral QAC breakfast  . losartan  50 mg Oral Daily  . metoprolol tartrate  25 mg Oral BID  . nicotine  21 mg Transdermal Daily  . pantoprazole  40 mg Oral Daily  . pneumococcal 23 valent vaccine  0.5 mL Intramuscular Tomorrow-1000  . sodium chloride flush  3 mL Intravenous Q12H   Infusions:   PRN Meds: acetaminophen **OR** acetaminophen, diclofenac sodium, gabapentin, traMADol, zolpidem   Allergies as of 09/23/2016 - Review Complete 09/23/2016  Allergen Reaction Noted  . Aspirin Other (See Comments) 10/06/2013    Family History  Problem Relation Age of Onset  . Schizophrenia Maternal Grandfather     Social History   Social History  . Marital status: Widowed    Spouse name: N/A  . Number of children: N/A  . Years of education: N/A   Occupational History  . Not on file.   Social History Main Topics  . Smoking status: Current Every Day Smoker    Types: Cigarettes  . Smokeless tobacco: Never Used  . Alcohol use No  . Drug use: No  . Sexual activity: No   Other Topics Concern  . Not on file   Social History Narrative  . No narrative on file    REVIEW OF SYSTEMS: Constitutional: recent onset fatigue ENT:  No nose bleeds Pulm:  Stable DOE with heavier housecleaning and longer walking.  No cough.  Only uses nebulizers prn CV:  No palpitations, No chest pain. Dependent lower extremity edema if she stands or is on her feet for a  long time. GU:  No hematuria, no frequency GI:  Per HPI Heme:  No Excessive bleeding or bruising. Does suffer from small purpura on the upper extremities if she knocks herself No previous requirements to take iron for anemia.   Transfusions:  Never before required transfusions. Neuro:  No headaches, no peripheral tingling or numbness Derm:  No itching, no rash or sores.  Endocrine:  No sweats or chills.  No polyuria or dysuria Immunization:  She is requesting a flu shot. She thinks she had Pneumovax about 5 years ago. Is not certain. Travel:  None beyond local counties in last few months.    PHYSICAL EXAM: Vital signs in last 24 hours: Vitals:   09/25/16 0616 09/25/16 0858  BP: (!) 120/59 (!) 134/54  Pulse: 70 72  Resp: 20 20  Temp: 98 F (36.7 C) 99.1 F (37.3 C)   Wt Readings from Last 3 Encounters:  09/24/16 86.2 kg (190 lb)  12/25/15 85.3 kg (188 lb)  09/07/15 85.4 kg (188 lb 3.2 oz)    General: Obese, pleasant, comfortable WF.  She is pale Head:  No facial asymmetry or swelling.  Eyes:  No scleral icterus. No conjunctival pallor. EOMI. Ears:  Not HOH  Nose:  No congestion or discharge. Mouth:  Tongue midline. Mucosa is moist and clear. Neck:  No mass, no JVD. Long thyroidectomy scar evident at the base of the neck. Lungs:  Clear bilaterally. No cough. No dyspnea. Heart: RRR. No MRG. Abdomen:  Marketed central obesity. Abdomen is soft. Nontender. There are the beginnings of an umbilicus hernia. No hepatosplenomegaly. Active bowel sounds..   Rectal: Deferred. Stool sent to the lab tested FOBT negative.   Musc/Skeltl: No joint redness or swelling. No significant contracture deformities. Some kyphosis in the spine. Extremities:  No CCE.  Neurologic:  Patient is alert. Oriented 3. No tremor. No asterixis. Limb strength not tested but moves all 4 limbs easily.  Skin:  No jaundice, no telangiectasia, no sores. No significant bruising. Tattoos:  None Nodes:  No cervical  adenopathy.   Psych:  Pleasant, cooperative. Slightly anxious at the discussion of cirrhosis.  Intake/Output from previous day: 10/03 0701 - 10/04 0700 In: 1523.7 [P.O.:720; Blood:803.7] Out: -  Intake/Output this shift: No intake/output data recorded.  LAB RESULTS:  Recent Labs  09/24/16 1923 09/25/16 0306 09/25/16 0910  WBC 8.6 8.8 10.3  HGB 6.8* 8.7* 8.8*  HCT 24.2* 29.3* 30.0*  PLT 300 296 277   BMET Lab Results  Component Value Date   NA 136 09/24/2016   NA 134 (L) 09/23/2016   NA 141 12/27/2015   K 3.8 09/24/2016   K 3.4 (L) 09/23/2016   K 4.3 12/27/2015   CL 103 09/24/2016   CL 101 09/23/2016   CL 107 12/27/2015   CO2 26 09/24/2016   CO2 21 (L) 09/23/2016   CO2 28 12/27/2015   GLUCOSE 171 (H) 09/24/2016   GLUCOSE 218 (H) 09/23/2016   GLUCOSE 217 (H) 12/27/2015   BUN <5 (L) 09/24/2016   BUN <5 (L) 09/23/2016   BUN 22 (H) 12/27/2015   CREATININE 0.99 09/24/2016   CREATININE 1.09 (H) 09/23/2016   CREATININE 1.01 (H) 12/27/2015   CALCIUM 8.7 (L) 09/24/2016   CALCIUM 8.8 (L) 09/23/2016   CALCIUM 8.8 (L) 12/27/2015   LFT  Recent Labs  09/24/16 1923  PROT 6.3*  ALBUMIN 2.8*  AST 51*  ALT 19  ALKPHOS 137*  BILITOT 0.9  BILIDIR 0.3  IBILI 0.6   PT/INR Lab Results  Component Value Date   INR 1.70 09/24/2016   INR 1.97 (H) 09/05/2015   INR 1.17 01/19/2014   Hepatitis Panel No results for input(s): HEPBSAG, HCVAB, HEPAIGM, HEPBIGM in the last 72 hours. C-Diff No components found for: CDIFF    RADIOLOGY STUDIES: Ct Abdomen Pelvis Wo Contrast  Result Date: 09/24/2016 CLINICAL DATA:  Decreased hemoglobin.  Abdominal pain EXAM: CT ABDOMEN AND PELVIS WITHOUT CONTRAST TECHNIQUE: Multidetector CT imaging of the abdomen and pelvis was performed following the standard protocol without IV contrast. COMPARISON:  09/06/2015 FINDINGS: Lower chest: Atelectasis in the lung bases. Hepatobiliary: Enlarged lateral segment left lobe of liver with nodular  contour consistent with hepatic cirrhosis. Cholelithiasis with single stone in the gallbladder. No gallbladder wall thickening or edema. No bile duct dilatation. Pancreas: Mild fatty infiltration. Spleen: Unenhanced appearance is unremarkable. Adrenals/Urinary Tract: No adrenal gland nodules. No hydronephrosis or hydroureter. No renal, ureteral, or bladder stones. Bladder wall is not thickened. Stomach/Bowel: Stomach and small bowel are decompressed. Diffusely stool-filled colon without abnormal distention or wall thickening. Appendix is not identified. Vascular/Lymphatic: Aortic atherosclerosis. No enlarged abdominal or pelvic lymph nodes. Reproductive: Uterus and ovaries are not enlarged. Other: Abdominal wall musculature appears intact. No free air or free fluid in the abdomen. No abdominal, mesenteric, or retroperitoneal collections. Musculoskeletal: No acute or significant osseous findings. IMPRESSION: No acute process demonstrated on noncontrast imaging of the abdomen or pelvis. No evidence for hematoma. Changes of hepatic cirrhosis. Cholelithiasis. Electronically Signed   By: Lucienne Capers M.D.   On: 09/24/2016 02:21   Dg Chest 2 View  Result Date: 09/23/2016 CLINICAL DATA:  Shortness of breath upon exertion for 3-4 weeks worsening today. History of hypertension - on meds and diabetes. EXAM: CHEST  2 VIEW COMPARISON:  12/25/2015 FINDINGS: Heart size and pulmonary vascularity are normal for technique. Linear atelectasis in the lung bases. No focal airspace disease or consolidation in the lungs.  No blunting of costophrenic angles. No pneumothorax. Central peribronchial thickening may indicate chronic bronchitis. IMPRESSION: Linear atelectasis in the lung bases. Chronic bronchitic changes. No evidence of active consolidation. Electronically Signed   By: Lucienne Capers M.D.   On: 09/23/2016 21:17     IMPRESSION:   *  Microcytic anemia.  Though FOBT negative x 1, need to r/o chronic GI blood loss.  Rule out colonic neoplasia, rule out portal gastropathy, rule out gastritis/ulcers. Her GI review of systems is unremarkable. Hemoglobin improved following transfusion  *  Cirrhosis of liver per CT scan.  This is likely the evolution of [place]/NAFLD as she is a lifelong teetotaler and has no risk factors for hepatitis B or C. PT/INR are slightly elevated, hard to say whether this is the result of the Xarelto or whether it is the result of some hepatic synthetic dysfunction.  *  Hx PE on chronic Xarelto. This is on hold. Last dose 10/2.   PLAN:     *  Patient should undergo upper endoscopy as well as colonoscopy.  Question whether we get this done tomorrow or on 10/6, will need to look at the schedule. Procedures are not urgent but need to get done before she discharges. She looks stable to undergo these procedures. She will have been off Xarelto greater than 48 hours by then.  Patient had been planning to get a colonoscopy so she is happy that this can be done while she is an inpatient.  *   labs ordered:   antimitochondrial and smooth muscle antibodies, and a, hepatitis B surface antigen and surface antibody as well as hepatitis C antibody.  CBC in AM.  *  Once daily Protonix had been added by the admitting physician, this seems reasonable for the time being. After endoscopy we can decide whether or not she needs this chronically.     Azucena Freed  09/25/2016, 9:51 AM Pager: [phone]

## 2016-09-26 NOTE — Progress Notes (Signed)
PROGRESS NOTE        PATIENT DETAILS Name: Rhonda Santos Age: 70 y.o. Sex: female Date of Birth: 07/07/1946 Admit Date: 09/23/2016 Admitting Physician Ivor Costa, MD PCP:[facility]  Brief Narrative: Patient is a 70 y.o. female with history of one episode of pulmonary embolism (July 2016) on Xarelto, hypertension, dyslipidemia, diabetes who presented to the ED for evaluation of shortness of breath, she was found to have a hemoglobin of 6.4. She was admitted for further evaluation and treatment.  Subjective: Numerous loose stools-on a colonoscopy preparation overnight. Denies hematochezia or melena   No chest pain No abdominal pain  Assessment/Plan: Principal Problem: New onset symptomatic iron deficiency anemia: High suspicion for occult/chronic GI bleeding-given the fact that she is on anticoagulation-and there is some suggestion of cirrhosis on a CT scan. 2 units of PRBC transfused since admission. Hemoglobin stable at 9.4. GI consulted endoscopic evaluation-scheduled for later today  Active Problems: Suspected chronic GI bleeding: See above. GI consult and for endoscopic evaluation. She is status post 2 units of PRBCs so far. Continue PPI for now.  ? Liver cirrhosis: Seen on CT scan of the abdomen. No history of EtOH use. Hepatitis serology negative, ANA/antimitochondrial antibody, anti-smooth muscle antibody are all pending.  Chronic diastolic heart failure: Compensated, continue Lasix and metoprolol. Weight appears stable.   History of pulmonary embolism July 2016: First episode of venous thromboembolism-has already completed 1 year of anticoagulation- lower extremity Dopplers negative for DVT. Xarelto remains on hold-? Resume on discharge as she has already completed 1 year of anticoagulation treatment.  Type 2 diabetes: CBGs stable on SSI, resume oral hypoglycemic agents on discharge.  Hypothyroidism: Continue  Synthroid  Hypertension: Controlled, continue metoprolol and losartan  Chronic pain syndrome/chronic back pain/arthritic pain: Follows with pain management-continue Neurontin, Cymbalta and as needed tramadol.  Anxiety/depression: Stable, continue as needed Xanax and Cymbalta.  COPD: Appear stable-lungs are clear, continue as needed bronchodilators  Tobacco abuse: Counseled  DVT Prophylaxis: SCD's  Code Status: Full code   Family Communication: None at bedside-patient is awake and alert-and understanding of the above noted plan.  Disposition Plan: Remain inpatient-home once GI workup is complete.  Antimicrobial agents: None  Procedures: None  CONSULTS:  None  Time spent: 25- minutes-Greater than 50% of this time was spent in counseling, explanation of diagnosis, planning of further management, and coordination of care.  MEDICATIONS: Anti-infectives    None      Scheduled Meds: . albuterol  2.5 mg Nebulization TID  . ALPRAZolam  0.5 mg Oral BID  . DULoxetine  60 mg Oral QAC lunch  . furosemide  40 mg Oral Daily  . insulin aspart  0-5 Units Subcutaneous QHS  . insulin aspart  0-9 Units Subcutaneous TID WC  . levothyroxine  175 mcg Oral QAC breakfast  . losartan  50 mg Oral Daily  . metoprolol tartrate  25 mg Oral BID  . nicotine  21 mg Transdermal Daily  . pantoprazole  40 mg Oral Daily  . sodium chloride flush  3 mL Intravenous Q12H   Continuous Infusions:  PRN Meds:.acetaminophen **OR** acetaminophen, diclofenac sodium, gabapentin, traMADol, zolpidem   PHYSICAL EXAM: Vital signs: Vitals:   09/25/16 2104 09/26/16 0446 09/26/16 0509 09/26/16 0839  BP: (!) 145/53  (!) 159/63   Pulse: 63  74   Resp: 19  20   Temp: 98.5 F (36.9 C)  98.1 F (36.7 C)   TempSrc: Oral  Oral   SpO2: 98%  100% 99%  Weight: 86.5 kg (190 lb 11.2 oz) 86.5 kg (190 lb 11.2 oz)    Height: 5' (1.524 m)      Filed Weights   09/24/16 2103 09/25/16 2104 09/26/16 0446  Weight:  86.2 kg (190 lb) 86.5 kg (190 lb 11.2 oz) 86.5 kg (190 lb 11.2 oz)   Body mass index is 37.24 kg/m.   General appearance :Awake, alert, not in any distress. Speech Clear. Not toxic Looking Eyes:, pupils equally reactive to light and accomodation,no scleral icterus.Pink conjunctiva HEENT: Atraumatic and Normocephalic Neck: supple, no JVD. No cervical lymphadenopathy. No thyromegaly Resp:Good air entry bilaterally, no added sounds  CVS: S1 S2 regular, no murmurs.  GI: Bowel sounds present, Non tender and not distended with no gaurding, rigidity or rebound.No organomegaly Extremities: B/L Lower Ext shows no edema, both legs are warm to touch Neurology:  speech clear,Non focal, sensation is grossly intact. Psychiatric: Normal judgment and insight. Alert and oriented x 3. Normal mood. Musculoskeletal:gait appears to be normal.No digital cyanosis Skin:No Rash, warm and dry Wounds:N/A  I have personally reviewed following labs and imaging studies  LABORATORY DATA: CBC:  Recent Labs Lab 09/23/16 2016  09/24/16 1324 09/24/16 1530 09/24/16 1923 09/25/16 0306 09/25/16 0910 09/26/16 0442  WBC 10.4  < > 8.6  --  8.6 8.8 10.3 9.4  NEUTROABS 6.1  --   --   --   --   --   --   --   HGB 6.1*  < > 7.1*  --  6.8* 8.7* 8.8* 9.4*  HCT 23.1*  < > 25.5* 23.3* 24.2* 29.3* 30.0* 32.3*  MCV 70.0*  < > 70.8*  --  70.6* 73.4* 72.5* 73.9*  PLT 291  < > 290  --  300 296 277 296  < > = values in this interval not displayed.  Basic Metabolic Panel:  Recent Labs Lab 09/23/16 1757 09/23/16 1948 09/24/16 0721  NA 134*  --  136  K 3.4*  --  3.8  CL 101  --  103  CO2 21*  --  26  GLUCOSE 218*  --  171*  BUN <5*  --  <5*  CREATININE 1.09*  --  0.99  CALCIUM 8.8*  --  8.7*  MG  --  1.6*  --     GFR: Estimated Creatinine Clearance: 50.2 mL/min (by C-G formula based on SCr of 0.99 mg/dL).  Liver Function Tests:  Recent Labs Lab 09/24/16 1923  AST 51*  ALT 19  ALKPHOS 137*  BILITOT 0.9   PROT 6.3*  ALBUMIN 2.8*   No results for input(s): LIPASE, AMYLASE in the last 168 hours. No results for input(s): AMMONIA in the last 168 hours.  Coagulation Profile:  Recent Labs Lab 09/24/16 0721  INR 1.70    Cardiac Enzymes: No results for input(s): CKTOTAL, CKMB, CKMBINDEX, TROPONINI in the last 168 hours.  BNP (last 3 results) No results for input(s): PROBNP in the last 8760 hours.  HbA1C: No results for input(s): HGBA1C in the last 72 hours.  CBG:  Recent Labs Lab 09/25/16 0746 09/25/16 1214 09/25/16 1720 09/25/16 2101 09/26/16 0757  GLUCAP 121* 186* 136* 155* 149*    Lipid Profile: No results for input(s): CHOL, HDL, LDLCALC, TRIG, CHOLHDL, LDLDIRECT in the last 72 hours.  Thyroid Function Tests: No results for input(s): TSH, T4TOTAL,  FREET4, T3FREE, THYROIDAB in the last 72 hours.  Anemia Panel:  Recent Labs  09/24/16 1923  VITAMINB12 402  FERRITIN 9*  TIBC 435  IRON 25*    Urine analysis:    Component Value Date/Time   COLORURINE YELLOW 09/05/2015 1440   APPEARANCEUR CLEAR 09/05/2015 1440   LABSPEC 1.006 09/05/2015 1440   PHURINE 5.5 09/05/2015 1440   GLUCOSEU NEGATIVE 09/05/2015 1440   HGBUR NEGATIVE 09/05/2015 1440   BILIRUBINUR NEGATIVE 09/05/2015 1440   KETONESUR NEGATIVE 09/05/2015 1440   PROTEINUR NEGATIVE 09/05/2015 1440   UROBILINOGEN 1.0 09/05/2015 1440   NITRITE NEGATIVE 09/05/2015 1440   LEUKOCYTESUR NEGATIVE 09/05/2015 1440    Sepsis Labs: Lactic Acid, Venous    Component Value Date/Time   LATICACIDVEN 1.3 09/05/2015 2035    MICROBIOLOGY: No results found for this or any previous visit (from the past 240 hour(s)).  RADIOLOGY STUDIES/RESULTS: Ct Abdomen Pelvis Wo Contrast  Result Date: 09/24/2016 CLINICAL DATA:  Decreased hemoglobin.  Abdominal pain EXAM: CT ABDOMEN AND PELVIS WITHOUT CONTRAST TECHNIQUE: Multidetector CT imaging of the abdomen and pelvis was performed following the standard protocol without IV  contrast. COMPARISON:  09/06/2015 FINDINGS: Lower chest: Atelectasis in the lung bases. Hepatobiliary: Enlarged lateral segment left lobe of liver with nodular contour consistent with hepatic cirrhosis. Cholelithiasis with single stone in the gallbladder. No gallbladder wall thickening or edema. No bile duct dilatation. Pancreas: Mild fatty infiltration. Spleen: Unenhanced appearance is unremarkable. Adrenals/Urinary Tract: No adrenal gland nodules. No hydronephrosis or hydroureter. No renal, ureteral, or bladder stones. Bladder wall is not thickened. Stomach/Bowel: Stomach and small bowel are decompressed. Diffusely stool-filled colon without abnormal distention or wall thickening. Appendix is not identified. Vascular/Lymphatic: Aortic atherosclerosis. No enlarged abdominal or pelvic lymph nodes. Reproductive: Uterus and ovaries are not enlarged. Other: Abdominal wall musculature appears intact. No free air or free fluid in the abdomen. No abdominal, mesenteric, or retroperitoneal collections. Musculoskeletal: No acute or significant osseous findings. IMPRESSION: No acute process demonstrated on noncontrast imaging of the abdomen or pelvis. No evidence for hematoma. Changes of hepatic cirrhosis. Cholelithiasis. Electronically Signed   By: Lucienne Capers M.D.   On: 09/24/2016 02:21   Dg Chest 2 View  Result Date: 09/23/2016 CLINICAL DATA:  Shortness of breath upon exertion for 3-4 weeks worsening today. History of hypertension - on meds and diabetes. EXAM: CHEST  2 VIEW COMPARISON:  12/25/2015 FINDINGS: Heart size and pulmonary vascularity are normal for technique. Linear atelectasis in the lung bases. No focal airspace disease or consolidation in the lungs. No blunting of costophrenic angles. No pneumothorax. Central peribronchial thickening may indicate chronic bronchitis. IMPRESSION: Linear atelectasis in the lung bases. Chronic bronchitic changes. No evidence of active consolidation. Electronically Signed    By: Lucienne Capers M.D.   On: 09/23/2016 21:17     LOS: 0 days   Oren Binet, MD  [facility] Pager:[phone]  If 7PM-7AM, please contact night-coverage [email or web address] Password [email or web address] 09/26/2016, 11:06 AM

## 2016-09-27 ENCOUNTER — Observation Stay (HOSPITAL_COMMUNITY): Payer: Medicare Other | Admitting: Certified Registered Nurse Anesthetist

## 2016-09-27 ENCOUNTER — Encounter (HOSPITAL_COMMUNITY): Payer: Self-pay | Admitting: *Deleted

## 2016-09-27 ENCOUNTER — Encounter (HOSPITAL_COMMUNITY)
Admission: EM | Disposition: A | Discharge status: Home / Self Care | Payer: Self-pay | Source: Home / Self Care | Attending: Internal Medicine

## 2016-09-27 DIAGNOSIS — E1165 Type 2 diabetes mellitus with hyperglycemia: Secondary | ICD-10-CM | POA: Diagnosis not present

## 2016-09-27 DIAGNOSIS — K635 Polyp of colon: Secondary | ICD-10-CM | POA: Diagnosis not present

## 2016-09-27 DIAGNOSIS — D122 Benign neoplasm of ascending colon: Secondary | ICD-10-CM | POA: Diagnosis not present

## 2016-09-27 DIAGNOSIS — D509 Iron deficiency anemia, unspecified: Secondary | ICD-10-CM | POA: Diagnosis not present

## 2016-09-27 DIAGNOSIS — K922 Gastrointestinal hemorrhage, unspecified: Secondary | ICD-10-CM | POA: Diagnosis not present

## 2016-09-27 DIAGNOSIS — Z7901 Long term (current) use of anticoagulants: Secondary | ICD-10-CM | POA: Diagnosis not present

## 2016-09-27 DIAGNOSIS — D500 Iron deficiency anemia secondary to blood loss (chronic): Secondary | ICD-10-CM | POA: Diagnosis not present

## 2016-09-27 DIAGNOSIS — K621 Rectal polyp: Secondary | ICD-10-CM | POA: Diagnosis not present

## 2016-09-27 HISTORY — PX: COLONOSCOPY: SHX5424

## 2016-09-27 LAB — CBC
HEMATOCRIT: 33.5 % — AB (ref 36.0–46.0)
HEMOGLOBIN: 9.8 g/dL — AB (ref 12.0–15.0)
MCH: 21.7 pg — ABNORMAL LOW (ref 26.0–34.0)
MCHC: 29.3 g/dL — ABNORMAL LOW (ref 30.0–36.0)
MCV: 74.1 fL — AB (ref 78.0–100.0)
PLATELETS: 287 10*3/uL (ref 150–400)
RBC: 4.52 MIL/uL (ref 3.87–5.11)
RDW: 21.5 % — ABNORMAL HIGH (ref 11.5–15.5)
WBC: 7.4 10*3/uL (ref 4.0–10.5)

## 2016-09-27 LAB — GLUCOSE, CAPILLARY: Glucose-Capillary: 96 mg/dL (ref 65–99)

## 2016-09-27 SURGERY — COLONOSCOPY
Anesthesia: Monitor Anesthesia Care

## 2016-09-27 MED ORDER — LACTATED RINGERS IV SOLN
INTRAVENOUS | Status: DC | PRN
  Administered 2016-09-27: 12:00:00 via INTRAVENOUS

## 2016-09-27 MED ORDER — PROPOFOL 10 MG/ML IV BOLUS
INTRAVENOUS | Status: DC | PRN
  Administered 2016-09-27: 30 mg via INTRAVENOUS

## 2016-09-27 MED ORDER — FERROUS SULFATE 325 (65 FE) MG PO TABS: 325.0000 mg | ORAL_TABLET | Freq: Three times a day (TID) | ORAL | 0 refills | Status: DC

## 2016-09-27 MED ORDER — PROPOFOL 500 MG/50ML IV EMUL
INTRAVENOUS | Status: DC | PRN
  Administered 2016-09-27: 50 ug/kg/min via INTRAVENOUS

## 2016-09-27 NOTE — Op Note (Signed)
[facility] Patient Name: Rhonda Santos Procedure Date : 09/27/2016 MRN: [id number] Attending MD: Mauri Pole , MD Date of Birth: 1946/06/25 CSN: [id number] Age: 70 Admit Type: Inpatient Procedure:                Colonoscopy Indications:              Unexplained iron deficiency anemia Providers:                Mauri Pole, MD, Vista Lawman, RN, Otilio Saber, Technician, Rejeana Brock, CRNA Referring MD:              Medicines:                Monitored Anesthesia Care Complications:            No immediate complications. Estimated Blood Loss:     Estimated blood loss was minimal. Procedure:                Pre-Anesthesia Assessment:                           - Prior to the procedure, a History and Physical                            was performed, and patient medications and                            allergies were reviewed. [name] tolerance of                            previous anesthesia was also reviewed. The risks                            and benefits of the procedure and the sedation                            options and risks were discussed with the patient.                            All questions were answered, and informed consent                            was obtained. Prior Anticoagulants: The patient                            last took Xarelto (rivaroxaban) 4 days prior to the                            procedure. ASA Grade Assessment: IV - A patient                            with severe systemic disease that is a constant  threat to life. After reviewing the risks and                            benefits, the patient was deemed in satisfactory                            condition to undergo the procedure.                           After obtaining informed consent, the colonoscope                            was passed under direct vision. Throughout the       procedure, the patient's blood pressure, pulse, and                            oxygen saturations were monitored continuously. The                            [id number] scope was introduced through                            the anus and advanced to the the cecum, identified                            by appendiceal orifice and ileocecal valve. The                            colonoscopy was performed without difficulty. The                            patient tolerated the procedure well. The quality                            of the bowel preparation was adequate. The                            ileocecal valve, appendiceal orifice, and rectum                            were photographed. Scope In: 1:00:31 PM Scope Out: 1:27:53 PM Scope Withdrawal Time: 0 hours 22 minutes 8 seconds  Total Procedure Duration: 0 hours 27 minutes 22 seconds  Findings:      The perianal and digital rectal examinations were normal.      A 3 mm polyp was found in the transverse colon. The polyp was sessile.       The polyp was removed with a cold biopsy forceps. Resection and       retrieval were complete.      A 15 mm polyp was found in the ascending colon. The polyp was sessile.       The polyp was removed with a hot snare. Resection and retrieval were       complete.      Non-bleeding internal hemorrhoids were found during retroflexion. The  hemorrhoids were large. Impression:               - One 3 mm polyp in the transverse colon, removed                            with a cold biopsy forceps. Resected and retrieved.                           - One 15 mm polyp in the ascending colon, removed                            with a hot snare. Resected and retrieved.                           - Non-bleeding internal hemorrhoids. Moderate Sedation:      N/A Recommendation:           - Resume previous diet.                           - Continue present medications.                           -  Await pathology results.                           - Repeat colonoscopy in 3 years for surveillance.                           - Ferrous sulphate 325mg  TID with meals, follow up                            in 6-8 weeks in GI office, if perssitent anemia                            will consider small bowel video capsule for further                            investigation.                           -Will sign off, please call with any questions Procedure Code(s):        --- Professional ---                           [phone], Colonoscopy, flexible; with removal of                            tumor(s), polyp(s), or other lesion(s) by snare                            technique                           [id number], 59, Colonoscopy, flexible; with biopsy,  single or multiple Diagnosis Code(s):        --- Professional ---                           D12.3, Benign neoplasm of transverse colon (hepatic                            flexure or splenic flexure)                           D12.2, Benign neoplasm of ascending colon                           K64.8, Other hemorrhoids CPT copyright 2016 [employer]. All rights reserved. The codes documented in this report are preliminary and upon coder review may  be revised to meet current compliance requirements. Mauri Pole, MD 09/27/2016 1:40:17 PM This report has been signed electronically. Number of Addenda: 0

## 2016-09-27 NOTE — Anesthesia Preprocedure Evaluation (Addendum)
Anesthesia Evaluation  Patient identified by MRN, date of birth, ID band Patient awake    Reviewed: Allergy & Precautions, NPO status , Patient's Chart, lab work & pertinent test results  Airway Mallampati: II  TM Distance: >3 FB Neck ROM: Full    Dental  (+) Edentulous Upper   Pulmonary Current Smoker,    breath sounds clear to auscultation       Cardiovascular hypertension,  Rhythm:Regular Rate:Normal     Neuro/Psych    GI/Hepatic   Endo/Other  diabetes  Renal/GU      Musculoskeletal   Abdominal   Peds  Hematology   Anesthesia Other Findings   Reproductive/Obstetrics                            Anesthesia Physical Anesthesia Plan  ASA: III  Anesthesia Plan: MAC   Post-op Pain Management:    Induction: Intravenous  Airway Management Planned: Natural Airway and Nasal Cannula  Additional Equipment:   Intra-op Plan:   Post-operative Plan:   Informed Consent: I have reviewed the patients History and Physical, chart, labs and discussed the procedure including the risks, benefits and alternatives for the proposed anesthesia with the patient or authorized representative who has indicated his/her understanding and acceptance.     Plan Discussed with: CRNA and Anesthesiologist  Anesthesia Plan Comments:         Anesthesia Quick Evaluation

## 2016-09-27 NOTE — Anesthesia Postprocedure Evaluation (Signed)
Anesthesia Post Note  Patient: Rhonda Santos  Procedure(s) Performed: Procedure(s) (LRB): COLONOSCOPY (N/A)  Patient location during evaluation: Endoscopy Anesthesia Type: MAC Level of consciousness: awake and alert and oriented Pain management: pain level controlled Vital Signs Assessment: post-procedure vital signs reviewed and stable Respiratory status: spontaneous breathing, nonlabored ventilation and respiratory function stable Cardiovascular status: blood pressure returned to baseline Anesthetic complications: no    Last Vitals:  Vitals:   09/27/16 1338 09/27/16 1340  BP: 128/64 (!) 128/52  Pulse: 64   Resp: 20   Temp:      Last Pain:  Vitals:   09/27/16 1440  TempSrc:   PainSc: 6                  Emmory Solivan COKER

## 2016-09-27 NOTE — Interval H&P Note (Signed)
History and Physical Interval Note:  09/27/2016 12:46 PM  Rhonda Santos  has presented today for surgery, with the diagnosis of FOBT positive  The various methods of treatment have been discussed with the patient and family. After consideration of risks, benefits and other options for treatment, the patient has consented to  Procedure(s): COLONOSCOPY (N/A) as a surgical intervention .  [name] history has been reviewed, patient examined, no change in status, stable for surgery.  I have reviewed the patient's chart and labs.  Questions were answered to the patient's satisfaction.     [name]

## 2016-09-27 NOTE — Discharge Summary (Signed)
PATIENT DETAILS Name: Rhonda Santos Age: 70 y.o. Sex: female Date of Birth: November 19, 1946 MRN: [id number]. Admitting Physician: Ivor Costa, MD PCP:[facility] Date: 09/23/2016 Discharge date: 09/27/2016  Recommendations for Outpatient Follow-up:  1. Follow up with PCP in 1-2 weeks 2. Colonoscopy biopsy results are still pending-please follow 3. Please obtain BMP/CBC in one week  Admitted From:  Home  Disposition: [place]: No  Equipment/Devices: None  Discharge Condition: Stable  CODE STATUS: FULL CODE  Diet recommendation:  Heart Healthy / Carb Modified   Brief Summary: See H&P, Labs, Consult and Test reports for all details in brief, Patient is a 70 y.o. female with history of one episode of pulmonary embolism (July 2016) on Xarelto, hypertension, dyslipidemia, diabetes who presented to the ED for evaluation of shortness of breath, she was found to have a hemoglobin of 6.4. She was admitted for further evaluation and treatment.  Brief Hospital Course: New onset symptomatic iron deficiency anemia: High suspicion for occult/chronic GI bleeding-given the fact that she was on anticoagulation-and there is some suggestion of cirrhosis on a CT scan. 2 units of PRBC transfused since admission. Hemoglobin stable at 9.8. GI consulted endoscopic evaluation-underwent colonoscopy and endoscopy. Endoscopy did not show any major abnormalities, colonoscopy showed several polyps that were resected-biopsy results are still pending. No overt bleeding seen throughout the patient's hospital stay. No further recommendations from gastroenterology apart from continuing iron supplementation on discharge. GI will arrange for outpatient follow-up in 6-8 weeks.   Active Problems: Suspected chronic GI bleeding: See above. GI consulted-and underwent endoscopic evaluation-see above. No overt bleeding seen during this hospital stay, she is on PPI. No bleeding  foci seen during endoscopic evaluation. I have discontinued Xarelto as she has completed more than 1 year of therapy, if she were to have another episode of unexplained anemia or bleeding per GI she will need a capsule endoscopy. Please continue to follow CBC closely in the outpatient setting.   ? Liver cirrhosis: Seen on CT scan of the abdomen. No history of EtOH use. Anti-smooth muscle antibody is slightly positive, however hepatitis serology,  ANA/antimitochondrial antibody, anti-smooth muscle antibody are all negative. Defer further workup to the outpatient setting.  Chronic diastolic heart failure: Compensated, continue Lasix and metoprolol. Weight appears stable.   History of pulmonary embolism July 2016: First episode of venous thromboembolism-has already completed 1 year of anticoagulation- lower extremity Dopplers negative for DVT. Xarelto was held on admission, since she has already completed more than one year of treatment-and now has unexplained iron deficiency anemia-after extensive discussion with the patient we have decided to continue to hold Xarelto at this time. I attempted to call her PCPs office-but looks like it is already closed. I have asked the patient to follow-up with the PCP closely.  Type 2 diabetes: CBGs stable on SSI, resume oral hypoglycemic agents on discharge.  Hypothyroidism: Continue Synthroid  Hypertension: Controlled, continue metoprolol and losartan  Chronic pain syndrome/chronic back pain/arthritic pain: Follows with pain management-continue Neurontin, Cymbalta and as needed tramadol.  Anxiety/depression: Stable, continue as needed Xanax and Cymbalta.  COPD: Appear stable-lungs are clear, continue as needed bronchodilators  Procedures/Studies: EGD 10/5  Colonoscopy 10/6  Discharge Diagnoses:  Principal Problem:   Symptomatic anemia Active Problems:   Hypokalemia   Left ventricular diastolic dysfunction, NYHA class 2   Diabetes mellitus  type 2, uncontrolled, without complications ([place])   History of pulmonary embolism/ July 23, 2015   Anemia  COPD exacerbation (HCC)   Chronic anticoagulation   Hypothyroidism   HTN (hypertension)   GERD (gastroesophageal reflux disease)   Iron deficiency anemia   Discharge Instructions:  Activity:  As tolerated with Full fall precautions use walker/cane & assistance as needed  Discharge Instructions    Call MD for:  difficulty breathing, headache or visual disturbances    Complete by:  As directed    Call MD for:  extreme fatigue    Complete by:  As directed    Call MD for:  persistant nausea and vomiting    Complete by:  As directed    Diet - low sodium heart healthy    Complete by:  As directed    Diet Carb Modified    Complete by:  As directed    Increase activity slowly    Complete by:  As directed        Medication List    STOP taking these medications   ibuprofen 200 MG tablet Commonly known as:  ADVIL,MOTRIN   rivaroxaban 20 MG Tabs tablet Commonly known as:  XARELTO     TAKE these medications   albuterol 108 (90 Base) MCG/ACT inhaler Commonly known as:  PROVENTIL HFA;VENTOLIN HFA Take 2 puffs 3 times daily for 5 more days and then every 6 hours as needed thereafter. What changed:  how much to take  how to take this  when to take this  reasons to take this  additional instructions   albuterol (2.5 MG/3ML) 0.083% nebulizer solution Commonly known as:  PROVENTIL Take 2.5 mg by nebulization 3 (three) times daily. What changed:  Another medication with the same name was changed. Make sure you understand how and when to take each.   ALPRAZolam 0.5 MG tablet Commonly known as:  XANAX Take 1 tablet (0.5 mg total) by mouth 3 (three) times daily. What changed:  when to take this   amLODipine 2.5 MG tablet Commonly known as:  NORVASC Take 1 tablet (2.5 mg total) by mouth daily.   diclofenac sodium 1 % Gel Commonly known as:  VOLTAREN Apply 1  application topically 3 (three) times daily as needed (pain).   DULoxetine 60 MG capsule Commonly known as:  CYMBALTA Take 60 mg by mouth daily before lunch.   ferrous sulfate 325 (65 FE) MG tablet Take 1 tablet (325 mg total) by mouth 3 (three) times daily with meals.   furosemide 40 MG tablet Commonly known as:  LASIX Take 40 mg by mouth daily.   gabapentin 400 MG capsule Commonly known as:  NEURONTIN Take 800 mg by mouth every 6 (six) hours as needed (pain).   glimepiride 1 MG tablet Commonly known as:  AMARYL For borderline diabetes. Take this medication daily if your blood sugar is greater than 160. Check your blood sugar daily. What changed:  how much to take  how to take this  when to take this  reasons to take this  additional instructions   levothyroxine 175 MCG tablet Commonly known as:  SYNTHROID, LEVOTHROID Take 175 mcg by mouth daily before breakfast.   losartan 50 MG tablet Commonly known as:  COZAAR Take 50 mg by mouth daily.   metFORMIN 500 MG tablet Commonly known as:  GLUCOPHAGE Take 500 mg by mouth 2 (two) times daily.   metoprolol tartrate 25 MG tablet Commonly known as:  LOPRESSOR Take 25 mg by mouth 2 (two) times daily.   omeprazole 40 MG capsule Commonly known as:  PRILOSEC Take 40 mg  by mouth daily as needed (heartburn/ acid reflux).   potassium chloride 10 MEQ tablet Commonly known as:  K-DUR Take 10 mEq by mouth 2 (two) times daily.   STIOLTO RESPIMAT 2.5-2.5 MCG/ACT Aers Generic drug:  Tiotropium Bromide-Olodaterol Inhale 1 puff into the lungs daily as needed (shortness of breath).   traMADol 50 MG tablet Commonly known as:  ULTRAM Take 100 mg by mouth every 6 (six) hours as needed (pain).   Vitamin D (Ergocalciferol) 50000 units Caps capsule Commonly known as:  DRISDOL Take 50,000 Units by mouth every Monday.      [facility]. Schedule an appointment as soon as  possible for a visit today.   Contact information: [address] [place] [address] [phone]        Harl Bowie, MD. Schedule an appointment as soon as possible for a visit in 3 month(s).   Specialty:  Gastroenterology Contact information: [place] [address] [phone]          Allergies  Allergen Reactions  . Aspirin Other (See Comments)    Burns line in stomach    Consultations:   GI  Other Procedures/Studies: Ct Abdomen Pelvis Wo Contrast  Result Date: 09/24/2016 CLINICAL DATA:  Decreased hemoglobin.  Abdominal pain EXAM: CT ABDOMEN AND PELVIS WITHOUT CONTRAST TECHNIQUE: Multidetector CT imaging of the abdomen and pelvis was performed following the standard protocol without IV contrast. COMPARISON:  09/06/2015 FINDINGS: Lower chest: Atelectasis in the lung bases. Hepatobiliary: Enlarged lateral segment left lobe of liver with nodular contour consistent with hepatic cirrhosis. Cholelithiasis with single stone in the gallbladder. No gallbladder wall thickening or edema. No bile duct dilatation. Pancreas: Mild fatty infiltration. Spleen: Unenhanced appearance is unremarkable. Adrenals/Urinary Tract: No adrenal gland nodules. No hydronephrosis or hydroureter. No renal, ureteral, or bladder stones. Bladder wall is not thickened. Stomach/Bowel: Stomach and small bowel are decompressed. Diffusely stool-filled colon without abnormal distention or wall thickening. Appendix is not identified. Vascular/Lymphatic: Aortic atherosclerosis. No enlarged abdominal or pelvic lymph nodes. Reproductive: Uterus and ovaries are not enlarged. Other: Abdominal wall musculature appears intact. No free air or free fluid in the abdomen. No abdominal, mesenteric, or retroperitoneal collections. Musculoskeletal: No acute or significant osseous findings. IMPRESSION: No acute process demonstrated on noncontrast imaging of the abdomen or pelvis. No evidence for hematoma. Changes  of hepatic cirrhosis. Cholelithiasis. Electronically Signed   By: Lucienne Capers M.D.   On: 09/24/2016 02:21   Dg Chest 2 View  Result Date: 09/23/2016 CLINICAL DATA:  Shortness of breath upon exertion for 3-4 weeks worsening today. History of hypertension - on meds and diabetes. EXAM: CHEST  2 VIEW COMPARISON:  12/25/2015 FINDINGS: Heart size and pulmonary vascularity are normal for technique. Linear atelectasis in the lung bases. No focal airspace disease or consolidation in the lungs. No blunting of costophrenic angles. No pneumothorax. Central peribronchial thickening may indicate chronic bronchitis. IMPRESSION: Linear atelectasis in the lung bases. Chronic bronchitic changes. No evidence of active consolidation. Electronically Signed   By: Lucienne Capers M.D.   On: 09/23/2016 21:17      TODAY-DAY OF DISCHARGE:  Subjective:   Rhonda Santos today has no headache,no chest abdominal pain,no new weakness tingling or numbness, feels much better wants to go home today.   Objective:   Blood pressure (!) 128/52, pulse 64, temperature 98.9 F (37.2 C), temperature source Oral, resp. rate 20, height 5' (1.524 m), weight 86.2 kg (190 lb 0.6  oz), SpO2 99 %.  Intake/Output Summary (Last 24 hours) at 09/27/16 1417 Last data filed at 09/27/16 1335  Gross per 24 hour  Intake             3280 ml  Output                0 ml  Net             3280 ml   Filed Weights   09/26/16 0446 09/26/16 1155 09/26/16 2039  Weight: 86.5 kg (190 lb 11.2 oz) 86.5 kg (190 lb 11.2 oz) 86.2 kg (190 lb 0.6 oz)    Exam: Awake Alert, Oriented *3, No new F.N deficits, Normal affect [place].AT,PERRAL Supple Neck,No JVD, No cervical lymphadenopathy appriciated.  Symmetrical Chest wall movement, Good air movement bilaterally, CTAB RRR,No Gallops,Rubs or new Murmurs, No Parasternal Heave +ve B.Sounds, Abd Soft, Non tender, No organomegaly appriciated, No rebound -guarding or rigidity. No Cyanosis, Clubbing or edema, No  new Rash or bruise   PERTINENT RADIOLOGIC STUDIES: Ct Abdomen Pelvis Wo Contrast  Result Date: 09/24/2016 CLINICAL DATA:  Decreased hemoglobin.  Abdominal pain EXAM: CT ABDOMEN AND PELVIS WITHOUT CONTRAST TECHNIQUE: Multidetector CT imaging of the abdomen and pelvis was performed following the standard protocol without IV contrast. COMPARISON:  09/06/2015 FINDINGS: Lower chest: Atelectasis in the lung bases. Hepatobiliary: Enlarged lateral segment left lobe of liver with nodular contour consistent with hepatic cirrhosis. Cholelithiasis with single stone in the gallbladder. No gallbladder wall thickening or edema. No bile duct dilatation. Pancreas: Mild fatty infiltration. Spleen: Unenhanced appearance is unremarkable. Adrenals/Urinary Tract: No adrenal gland nodules. No hydronephrosis or hydroureter. No renal, ureteral, or bladder stones. Bladder wall is not thickened. Stomach/Bowel: Stomach and small bowel are decompressed. Diffusely stool-filled colon without abnormal distention or wall thickening. Appendix is not identified. Vascular/Lymphatic: Aortic atherosclerosis. No enlarged abdominal or pelvic lymph nodes. Reproductive: Uterus and ovaries are not enlarged. Other: Abdominal wall musculature appears intact. No free air or free fluid in the abdomen. No abdominal, mesenteric, or retroperitoneal collections. Musculoskeletal: No acute or significant osseous findings. IMPRESSION: No acute process demonstrated on noncontrast imaging of the abdomen or pelvis. No evidence for hematoma. Changes of hepatic cirrhosis. Cholelithiasis. Electronically Signed   By: Lucienne Capers M.D.   On: 09/24/2016 02:21   Dg Chest 2 View  Result Date: 09/23/2016 CLINICAL DATA:  Shortness of breath upon exertion for 3-4 weeks worsening today. History of hypertension - on meds and diabetes. EXAM: CHEST  2 VIEW COMPARISON:  12/25/2015 FINDINGS: Heart size and pulmonary vascularity are normal for technique. Linear atelectasis in  the lung bases. No focal airspace disease or consolidation in the lungs. No blunting of costophrenic angles. No pneumothorax. Central peribronchial thickening may indicate chronic bronchitis. IMPRESSION: Linear atelectasis in the lung bases. Chronic bronchitic changes. No evidence of active consolidation. Electronically Signed   By: Lucienne Capers M.D.   On: 09/23/2016 21:17     PERTINENT LAB RESULTS: CBC:  Recent Labs  09/26/16 0442 09/27/16 0427  WBC 9.4 7.4  HGB 9.4* 9.8*  HCT 32.3* 33.5*  PLT 296 287   CMET CMP     Component Value Date/Time   NA 136 09/24/2016 0721   K 3.8 09/24/2016 0721   CL 103 09/24/2016 0721   CO2 26 09/24/2016 0721   GLUCOSE 171 (H) 09/24/2016 0721   BUN <5 (L) 09/24/2016 0721   CREATININE 0.99 09/24/2016 0721   CALCIUM 8.7 (L) 09/24/2016 0721   PROT 6.3 (L)  09/24/2016 1923   ALBUMIN 2.8 (L) 09/24/2016 1923   AST 51 (H) 09/24/2016 1923   ALT 19 09/24/2016 1923   ALKPHOS 137 (H) 09/24/2016 1923   BILITOT 0.9 09/24/2016 1923   GFRNONAA 56 (L) 09/24/2016 0721   GFRAA >60 09/24/2016 0721    GFR Estimated Creatinine Clearance: 50.1 mL/min (by C-G formula based on SCr of 0.99 mg/dL). No results for input(s): LIPASE, AMYLASE in the last 72 hours. No results for input(s): CKTOTAL, CKMB, CKMBINDEX, TROPONINI in the last 72 hours. Invalid input(s): POCBNP No results for input(s): DDIMER in the last 72 hours. No results for input(s): HGBA1C in the last 72 hours. No results for input(s): CHOL, HDL, LDLCALC, TRIG, CHOLHDL, LDLDIRECT in the last 72 hours. No results for input(s): TSH, T4TOTAL, T3FREE, THYROIDAB in the last 72 hours.  Invalid input(s): FREET3  Recent Labs  09/24/16 1923  VITAMINB12 402  FERRITIN 9*  TIBC 435  IRON 25*   Coags: No results for input(s): INR in the last 72 hours.  Invalid input(s): PT Microbiology: No results found for this or any previous visit (from the past 240 hour(s)).  FURTHER DISCHARGE  INSTRUCTIONS:  Get Medicines reviewed and adjusted: Please take all your medications with you for your next visit with your Primary MD  Laboratory/radiological data: Please request your Primary MD to go over all hospital tests and procedure/radiological results at the follow up, please ask your Primary MD to get all Hospital records sent to his/her office.  In some cases, they will be blood work, cultures and biopsy results pending at the time of your discharge. Please request that your primary care M.D. goes through all the records of your hospital data and follows up on these results.  Also Note the following: If you experience worsening of your admission symptoms, develop shortness of breath, life threatening emergency, suicidal or homicidal thoughts you must seek medical attention immediately by calling 911 or calling your MD immediately  if symptoms less severe.  You must read complete instructions/literature along with all the possible adverse reactions/side effects for all the Medicines you take and that have been prescribed to you. Take any new Medicines after you have completely understood and accpet all the possible adverse reactions/side effects.   Do not drive when taking Pain medications or sleeping medications (Benzodaizepines)  Do not take more than prescribed Pain, Sleep and Anxiety Medications. It is not advisable to combine anxiety,sleep and pain medications without talking with your primary care practitioner  Special Instructions: If you have smoked or chewed Tobacco  in the last 2 yrs please stop smoking, stop any regular Alcohol  and or any Recreational drug use.  Wear Seat belts while driving.  Please note: You were cared for by a hospitalist during your hospital stay. Once you are discharged, your primary care physician will handle any further medical issues. Please note that NO REFILLS for any discharge medications will be authorized once you are discharged, as it is  imperative that you return to your primary care physician (or establish a relationship with a primary care physician if you do not have one) for your post hospital discharge needs so that they can reassess your need for medications and monitor your lab values.  Total Time spent coordinating discharge including counseling, education and face to face time equals 45 minutes.  SignedOren Binet 09/27/2016 2:17 PM

## 2016-09-27 NOTE — Transfer of Care (Signed)
Immediate Anesthesia Transfer of Care Note  Patient: Rhonda Santos  Procedure(s) Performed: Procedure(s): COLONOSCOPY (N/A)  Patient Location: PACU  Anesthesia Type:MAC  Level of Consciousness: awake, alert  and oriented  Airway & Oxygen Therapy: Patient Spontanous Breathing  Post-op Assessment: Report given to RN, Post -op Vital signs reviewed and stable and Patient moving all extremities X 4  Post vital signs: Reviewed and stable  Last Vitals:  Vitals:   09/27/16 1109 09/27/16 1338  BP: (!) 143/53 128/64  Pulse: 68 64  Resp: (!) 24 20  Temp:      Last Pain:  Vitals:   09/27/16 1109  TempSrc: Oral  PainSc:       Patients Stated Pain Goal: 2 ([id number])  Complications: No apparent anesthesia complications

## 2016-09-27 NOTE — Care Management Note (Signed)
Case Management Note  Patient Details  Name: MOSELLA FALTER MRN: [id number] Date of Birth: 27-Feb-1946  Subjective/Objective:        Anemia, GI bleed            Action/Plan: Discharge Planning: AVS reviewed Chart reviewed. NCM spoke to pt and lives at home with son. Pt states she has RW and 3n1 bedside commode at home. Pt requesting a Rollator for home. She will follow up with PCP and pick up at [facility] DME store.   PCP - [facility]   Expected Discharge Date:  09/27/2016               Expected Discharge Plan:  Home/Self Care  In-House Referral:  NA  Discharge planning Services  CM Consult  Post Acute Care Choice:  NA Choice offered to:  NA  DME Arranged:  N/A DME Agency:  NA  HH Arranged:  NA HH Agency:  NA  Status of Service:  Completed, signed off  If discussed at [place] of Stay Meetings, dates discussed:    Additional Comments:  Erenest Rasher, RN 09/27/2016, 5:21 PM

## 2016-09-27 NOTE — Progress Notes (Signed)
Patient discharge teaching given, including activity, diet, follow-up appoints, and medications. Patient verbalized understanding of all discharge instructions. IV access was d/c'd. Vitals are stable. Skin is intact except as charted in most recent assessments. Pt to be escorted out by NT, to be driven home by family.  Chris-RN 

## 2016-09-30 ENCOUNTER — Encounter (HOSPITAL_COMMUNITY): Payer: Self-pay | Admitting: Gastroenterology

## 2016-10-24 ENCOUNTER — Emergency Department (HOSPITAL_COMMUNITY)
Admission: EM | Admit: 2016-10-24 | Discharge: 2016-10-24 | Disposition: A | Discharge status: Home / Self Care | Payer: Medicare Other | Attending: Emergency Medicine | Admitting: Emergency Medicine

## 2016-10-24 ENCOUNTER — Encounter (HOSPITAL_COMMUNITY): Payer: Self-pay | Admitting: *Deleted

## 2016-10-24 DIAGNOSIS — J449 Chronic obstructive pulmonary disease, unspecified: Secondary | ICD-10-CM | POA: Diagnosis not present

## 2016-10-24 DIAGNOSIS — I129 Hypertensive chronic kidney disease with stage 1 through stage 4 chronic kidney disease, or unspecified chronic kidney disease: Secondary | ICD-10-CM | POA: Insufficient documentation

## 2016-10-24 DIAGNOSIS — Z711 Person with feared health complaint in whom no diagnosis is made: Secondary | ICD-10-CM | POA: Diagnosis not present

## 2016-10-24 DIAGNOSIS — Z7984 Long term (current) use of oral hypoglycemic drugs: Secondary | ICD-10-CM | POA: Diagnosis not present

## 2016-10-24 DIAGNOSIS — F17210 Nicotine dependence, cigarettes, uncomplicated: Secondary | ICD-10-CM | POA: Diagnosis not present

## 2016-10-24 DIAGNOSIS — N183 Chronic kidney disease, stage 3 (moderate): Secondary | ICD-10-CM | POA: Diagnosis not present

## 2016-10-24 DIAGNOSIS — R531 Weakness: Secondary | ICD-10-CM | POA: Diagnosis present

## 2016-10-24 DIAGNOSIS — E1122 Type 2 diabetes mellitus with diabetic chronic kidney disease: Secondary | ICD-10-CM | POA: Diagnosis not present

## 2016-10-24 DIAGNOSIS — E039 Hypothyroidism, unspecified: Secondary | ICD-10-CM | POA: Insufficient documentation

## 2016-10-24 LAB — BASIC METABOLIC PANEL
ANION GAP: 10 (ref 5–15)
BUN: 5 mg/dL — AB (ref 6–20)
CALCIUM: 9 mg/dL (ref 8.9–10.3)
CO2: 24 mmol/L (ref 22–32)
Chloride: 104 mmol/L (ref 101–111)
Creatinine, Ser: 1 mg/dL (ref 0.44–1.00)
GFR calc Af Amer: >60 mL/min (ref >60)
GFR calc non Af Amer: 55 mL/min — ABNORMAL LOW (ref >60)
GLUCOSE: 165 mg/dL — AB (ref 65–99)
POTASSIUM: 4.3 mmol/L (ref 3.5–5.1)
Sodium: 138 mmol/L (ref 135–145)

## 2016-10-24 LAB — URINALYSIS, ROUTINE W REFLEX MICROSCOPIC
BILIRUBIN URINE: NEGATIVE
Glucose, UA: NEGATIVE mg/dL
HGB URINE DIPSTICK: NEGATIVE
KETONES UR: NEGATIVE mg/dL
Leukocytes, UA: NEGATIVE
Nitrite: NEGATIVE
PH: 6 (ref 5.0–8.0)
Protein, ur: NEGATIVE mg/dL
SPECIFIC GRAVITY, URINE: 1.01 (ref 1.005–1.030)

## 2016-10-24 LAB — TYPE AND SCREEN
ABO/RH(D): O POS
Antibody Screen: NEGATIVE

## 2016-10-24 LAB — CBC
HEMATOCRIT: 31.7 % — AB (ref 36.0–46.0)
HEMOGLOBIN: 9.3 g/dL — AB (ref 12.0–15.0)
MCH: 22 pg — AB (ref 26.0–34.0)
MCHC: 29.3 g/dL — AB (ref 30.0–36.0)
MCV: 74.9 fL — ABNORMAL LOW (ref 78.0–100.0)
Platelets: 307 10*3/uL (ref 150–400)
RBC: 4.23 MIL/uL (ref 3.87–5.11)
RDW: 22.8 % — AB (ref 11.5–15.5)
WBC: 10.5 10*3/uL (ref 4.0–10.5)

## 2016-10-24 NOTE — ED Triage Notes (Signed)
Pt reports hx of admission here due to anemia and needing blood transfusions. Was sent here today by pcp for lab draw to recheck her hgb due to still having occ weakness. They were unable to locate the source of her blood loss during last admission.

## 2016-10-24 NOTE — Discharge Instructions (Signed)
Continue your current meds.   See your doctor for follow up   Return to ER if you have fever, severe abdominal pain, blood in stool or black stools, vomiting, chest pain.

## 2016-10-24 NOTE — ED Notes (Signed)
Papers reviewed and patient going to MD on Monday for follow up

## 2016-10-24 NOTE — ED Provider Notes (Signed)
[facility] DEPT Provider Note   CSN: [id number] Arrival date & time: 10/24/16  1151     History   Chief Complaint Chief Complaint  Patient presents with  . Weakness    HPI XANTHIA KURIHARA is a 70 y.o. female hx of cirrhosis, PE on xarelto, recent GI bleed, here with weakness. She was admitted about month ago and was found to have a GI bleed with a hemoglobin 6.5. She was transfused 2 units and had colonoscopy but no source of bleeding was found. Patient saw primary care doctor today and was sent here for evaluation. Patient states that she just has been feeling weak all over but this is baseline for her. She has chronic abdominal pain from the cirrhosis but that is not getting worse. Denies any fevers or chills or vomiting or chest pain. Denies passing out. Denies any blood in the stool or any melena.   The history is provided by the patient.    Past Medical History:  Diagnosis Date  . Anemia 09/2016  . Cirrhosis ([place]) 08/2015   Fatty liver seen on imaging of 2014.  Lifetime teetotaler.  . DDD (degenerative disc disease)    With chronic pain  . Diabetes mellitus without complication ([place])   . Diastolic dysfunction 99991111  . Gallstone 2014   Incidental finding, no complications.  . Hyperlipidemia   . Hypertension   . Hypokalemia 10/06/2013   Secondary to lasix  . Hypoxia 10/07/2013   From mild resp depression from opioids  . Mitral valve prolapse 09/2013   Mild per echo  . Opioid intoxication delirium ([place]) 2015   Unintentional overdose with acute psychoses  . Osteoporosis   . Prolonged Q-T interval on ECG 10/07/2013   Secondary to hypokalemia  . Pulmonary embolism ([place]) 2016   Started on Xarelto  . Thyroid disease    Initially hyperthyroid, question Graves' disease, underwent thyroidectomy and subsequently hypothyroid  . Tobacco abuse 10/07/2013    Patient Active Problem List   Diagnosis Date Noted  . Iron deficiency anemia   . Symptomatic anemia 09/24/2016   . Abdominal pain   . COPD exacerbation ([place]) 12/25/2015  . Acute respiratory failure with hypoxia ([place]) 12/25/2015  . History of pulmonary embolus (PE) 12/25/2015  . Chronic anticoagulation 12/25/2015  . Hypothyroidism 12/25/2015  . HTN (hypertension) 12/25/2015  . GERD (gastroesophageal reflux disease) 12/25/2015  . Acute abdominal pain   . 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 Psychotic disorder 09/05/2015  . Sepsis with hypotension ([place]) 09/05/2015  . Elevated lactic acid level 09/05/2015  . Abdominal pain, acute 09/05/2015  . Acute renal failure superimposed on stage 3 chronic kidney disease ([place]) 09/05/2015  . History of pulmonary embolism/ July 23, 2015 09/05/2015  . Anemia 09/05/2015  . Blood poisoning ([place])   . 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 Diabetes mellitus type 2, uncontrolled, without complications ([place]) 99991111  . Prolonged Q-T interval on ECG 10/07/2013  . Metabolic alkalosis [id number]  . Tobacco abuse 10/07/2013  . Transaminitis 10/07/2013  . Left ventricular diastolic dysfunction, NYHA class 2 10/07/2013  . Hypokalemia 10/06/2013  . Weakness 10/06/2013  . Altered mental status 10/06/2013    Past Surgical History:  Procedure Laterality Date  . COLONOSCOPY N/A 09/26/2016   Procedure: COLONOSCOPY;  Surgeon: Mauri Pole, MD;  Location: [facility];  Service: Endoscopy;  Laterality: N/A;  . COLONOSCOPY N/A 09/27/2016   Procedure: COLONOSCOPY;  Surgeon: Mauri Pole, MD;  Location: [facility];  Service: Endoscopy;  Laterality: N/A;  .  ESOPHAGOGASTRODUODENOSCOPY N/A 09/26/2016   Procedure: ESOPHAGOGASTRODUODENOSCOPY (EGD);  Surgeon: Mauri Pole, MD;  Location: [facility] ENDOSCOPY;  Service: Endoscopy;  Laterality: N/A;  . THYROID SURGERY      OB History    No data available       Home Medications    Prior to Admission medications   Medication Sig Start Date End Date Taking? Authorizing Provider  albuterol (PROVENTIL HFA;VENTOLIN  HFA) 108 (90 Base) MCG/ACT inhaler Take 2 puffs 3 times daily for 5 more days and then every 6 hours as needed thereafter. Patient taking differently: Inhale 2 puffs into the lungs every 6 (six) hours as needed for wheezing or shortness of breath.  12/27/15   Rexene Alberts, MD  albuterol (PROVENTIL) (2.5 MG/3ML) 0.083% nebulizer solution Take 2.5 mg by nebulization 3 (three) times daily. 08/19/16   Historical Provider, MD  ALPRAZolam Duanne Moron) 0.5 MG tablet Take 1 tablet (0.5 mg total) by mouth 3 (three) times daily. Patient taking differently: Take 0.5 mg by mouth 2 (two) times daily.  06/30/14   Cloria Spring, MD  amLODipine (NORVASC) 2.5 MG tablet Take 1 tablet (2.5 mg total) by mouth daily. Patient not taking: Reported on 09/23/2016 09/07/15   Orson Eva, MD  diclofenac sodium (VOLTAREN) 1 % GEL Apply 1 application topically 3 (three) times daily as needed (pain).  08/16/16   Historical Provider, MD  DULoxetine (CYMBALTA) 60 MG capsule Take 60 mg by mouth daily before lunch.  06/08/15   Historical Provider, MD  ferrous sulfate 325 (65 FE) MG tablet Take 1 tablet (325 mg total) by mouth 3 (three) times daily with meals. 09/27/16   Shanker Kristeen Mans, MD  furosemide (LASIX) 40 MG tablet Take 40 mg by mouth daily.     Historical Provider, MD  gabapentin (NEURONTIN) 400 MG capsule Take 800 mg by mouth every 6 (six) hours as needed (pain).  09/09/16   Historical Provider, MD  glimepiride (AMARYL) 1 MG tablet For borderline diabetes. Take this medication daily if your blood sugar is greater than 160. Check your blood sugar daily. Patient taking differently: Take 1 mg by mouth daily as needed (CBG >160). For borderline diabetes. 12/27/15   Rexene Alberts, MD  levothyroxine (SYNTHROID, LEVOTHROID) 175 MCG tablet Take 175 mcg by mouth daily before breakfast.  09/28/13   Historical Provider, MD  losartan (COZAAR) 50 MG tablet Take 50 mg by mouth daily. 08/07/16   Historical Provider, MD  metFORMIN (GLUCOPHAGE) 500 MG tablet  Take 500 mg by mouth 2 (two) times daily. 09/13/16   Historical Provider, MD  metoprolol tartrate (LOPRESSOR) 25 MG tablet Take 25 mg by mouth 2 (two) times daily.  08/24/13   Historical Provider, MD  omeprazole (PRILOSEC) 40 MG capsule Take 40 mg by mouth daily as needed (heartburn/ acid reflux).  09/28/13   Historical Provider, MD  potassium chloride (K-DUR) 10 MEQ tablet Take 10 mEq by mouth 2 (two) times daily.     Historical Provider, MD  Tiotropium Bromide-Olodaterol (STIOLTO RESPIMAT) 2.5-2.5 MCG/ACT AERS Inhale 1 puff into the lungs daily as needed (shortness of breath).    Historical Provider, MD  traMADol (ULTRAM) 50 MG tablet Take 100 mg by mouth every 6 (six) hours as needed (pain).  09/09/16   Historical Provider, MD  Vitamin D, Ergocalciferol, (DRISDOL) 50000 UNITS CAPS capsule Take 50,000 Units by mouth every Monday.  07/17/15   Historical Provider, MD    Family History Family History  Problem Relation Age of Onset  .  Schizophrenia Maternal Grandfather     Social History Social History  Substance Use Topics  . Smoking status: Current Every Day Smoker    Packs/day: 1.00    Years: 37.00    Types: Cigarettes  . Smokeless tobacco: Never Used  . Alcohol use No     Allergies   Aspirin   Review of Systems Review of Systems  Neurological: Positive for weakness.  All other systems reviewed and are negative.    Physical Exam Updated Vital Signs BP (!) 131/49 (BP Location: Right Arm)   Pulse 74   Temp 98.3 F (36.8 C) (Oral)   Resp 16   Ht 5\' 4"  (1.626 m)   Wt 189 lb (85.7 kg)   SpO2 96%   BMI 32.44 kg/m   Physical Exam  Constitutional: She is oriented to person, place, and time.  Chronically ill but not acutely ill.   HENT:  Head: Normocephalic.  Mouth/Throat: Oropharynx is clear and moist.  Eyes: EOM are normal. Pupils are equal, round, and reactive to light.  Neck: Normal range of motion. Neck supple.  Cardiovascular: Normal rate, regular rhythm and normal  heart sounds.   Pulmonary/Chest: Effort normal and breath sounds normal. No respiratory distress. She has no wheezes. She has no rales.  Abdominal: Soft. Bowel sounds are normal.  + fluid wave, nontender.   Musculoskeletal: Normal range of motion.  Neurological: She is alert and oriented to person, place, and time.  Skin: Skin is warm.  Psychiatric: She has a normal mood and affect.  Nursing note and vitals reviewed.    ED Treatments / Results  Labs (all labs ordered are listed, but only abnormal results are displayed) Labs Reviewed  BASIC METABOLIC PANEL - Abnormal; Notable for the following:       Result Value   Glucose, Bld 165 (*)    BUN 5 (*)    GFR calc non Af Amer 55 (*)    All other components within normal limits  CBC - Abnormal; Notable for the following:    Hemoglobin 9.3 (*)    HCT 31.7 (*)    MCV 74.9 (*)    MCH 22.0 (*)    MCHC 29.3 (*)    RDW 22.8 (*)    All other components within normal limits  URINALYSIS, ROUTINE W REFLEX MICROSCOPIC (NOT AT [facility])  TYPE AND SCREEN    EKG  EKG Interpretation  Date/Time:  Thursday October 24 2016 14:06:26 EDT Ventricular Rate:  74 PR Interval:    QRS Duration: 141 QT Interval:  453 QTC Calculation: 503 R Axis:   -53 Text Interpretation:  Sinus rhythm Borderline prolonged PR interval Left bundle branch block Baseline wander in lead(s) II III aVF No significant change since last tracing Confirmed by Liliauna Santoni  MD, Miliano Cotten ([id number]) on 10/24/2016 2:08:07 PM Also confirmed by Darl Householder  MD, Annarose Ouellet ([id number]), editor Rolla Plate, Joelene Millin [phone])  on 10/24/2016 2:44:15 PM       Radiology No results found.  Procedures Procedures (including critical care time)  Medications Ordered in ED Medications - No data to display   Initial Impression / Assessment and Plan / ED Course  I have reviewed the triage vital signs and the nursing notes.  Pertinent labs & imaging results that were available during my care of the patient were reviewed by me  and considered in my medical decision making (see chart for details).  Clinical Course   TORY LINDENMUTH is a 70 y.o. female here with weakness. Patient is  chronically ill but appears well. Vitals stable. Not orthostatic. No melena, no abdominal pain. Checked CBC and Hg baseline at 9.4 (was 9.8 at discharge), chemistry at baseline. UA nl. I think her symptoms are baseline and she felt well. Will dc home.    Final Clinical Impressions(s) / ED Diagnoses   Final diagnoses:  None    New Prescriptions New Prescriptions   No medications on file     Drenda Freeze, MD 10/24/16 1454

## 2016-11-29 ENCOUNTER — Ambulatory Visit: Payer: Self-pay | Admitting: Gastroenterology

## 2017-04-16 ENCOUNTER — Encounter (INDEPENDENT_AMBULATORY_CARE_PROVIDER_SITE_OTHER): Payer: Self-pay | Admitting: Internal Medicine

## 2017-04-29 ENCOUNTER — Ambulatory Visit (INDEPENDENT_AMBULATORY_CARE_PROVIDER_SITE_OTHER): Payer: Medicare Other | Admitting: Internal Medicine

## 2017-04-29 ENCOUNTER — Encounter (INDEPENDENT_AMBULATORY_CARE_PROVIDER_SITE_OTHER): Payer: Self-pay | Admitting: Internal Medicine

## 2017-04-29 VITALS — BP 116/48 | HR 64 | Temp 98.2°F | Ht 66.0 in | Wt 188.9 lb

## 2017-04-29 DIAGNOSIS — R748 Abnormal levels of other serum enzymes: Secondary | ICD-10-CM | POA: Diagnosis not present

## 2017-04-29 DIAGNOSIS — K760 Fatty (change of) liver, not elsewhere classified: Secondary | ICD-10-CM | POA: Diagnosis not present

## 2017-04-29 LAB — HEPATIC FUNCTION PANEL
ALT: 11 U/L (ref 6–29)
AST: 20 U/L (ref 10–35)
Albumin: 3.3 g/dL — ABNORMAL LOW (ref 3.6–5.1)
Alkaline Phosphatase: 188 U/L — ABNORMAL HIGH (ref 33–130)
BILIRUBIN INDIRECT: 0.4 mg/dL (ref 0.2–1.2)
BILIRUBIN TOTAL: 0.6 mg/dL (ref 0.2–1.2)
Bilirubin, Direct: 0.2 mg/dL (ref <=0.2)
TOTAL PROTEIN: 7 g/dL (ref 6.1–8.1)

## 2017-04-29 NOTE — Progress Notes (Signed)
Subjective:    Patient ID: Rhonda Santos, female    DOB: 06/16/1946, 71 y.o.   MRN: [id number]  HPIReferred by [facility] for elevated liver enzymes. Hx of same in past and per CT 2016 and 2017 has changes of hepatic cirrhosis. She denies prior hx of cirrhosis.  Her appetite is good. No weight loss. She does very little exercising. She has arthritis.  02/08/2017 AP 206, AST 19, ALT 10. Has been evaluated by Santina Evans last year for elevated liver eyzymes. Auto immune ruled out.   Hepatic Function Latest Ref Rng & Units 09/24/2016 09/06/2015 09/05/2015  Total Protein 6.5 - 8.1 g/dL 6.3(L) 5.6(L) 5.5(L)  Albumin 3.5 - 5.0 g/dL 2.8(L) 2.5(L) 2.7(L)  AST 15 - 41 U/L 51(H) 28 35  ALT 14 - 54 U/L _0 Alk Phosphatase 38 - 126 U/L 137(H) 101 112  Total Bilirubin 0.3 - 1.2 mg/dL 0.9 0.7 0.6  Bilirubin, Direct 0.1 - 0.5 mg/dL 0.3 - -   09/24/2016 CT abdomen/pelvis with CM:  Decreased hemoglobin/ abdominal pain; IMPRESSION: No acute process demonstrated on noncontrast imaging of the abdomen or pelvis. No evidence for hematoma. Changes of hepatic cirrhosis. Cholelithiasis.     H and H 10.9 and 35.1, MCV 80.9 09/24/2016 Acute Hepatitis Panel: normal.  09/25/2016 Hep C antibody negative. ANA negative, Mitochondrial antibody negative. Hepatitis B surface antigien negative, Hepatitis A antibody positive    Review of Systems     Past Medical History:  Diagnosis Date  . Anemia 09/2016  . Cirrhosis ([place]) 08/2015   Fatty liver seen on imaging of 2014.  Lifetime teetotaler.  . DDD (degenerative disc disease)    With chronic pain  . Diabetes mellitus without complication ([place])   . Diastolic dysfunction 83/3825  . Gallstone 2014   Incidental finding, no complications.  . Hyperlipidemia   . Hypertension   . Hypokalemia 10/06/2013   Secondary to lasix  . Hypoxia 10/07/2013   From mild resp depression from opioids  . Mitral valve prolapse 09/2013   Mild per echo  .  Opioid intoxication delirium ([place]) 2015   Unintentional overdose with acute psychoses  . Osteoporosis   . Prolonged Q-T interval on ECG 10/07/2013   Secondary to hypokalemia  . Pulmonary embolism ([place]) 2016   Started on Xarelto  . Thyroid disease    Initially hyperthyroid, question Graves' disease, underwent thyroidectomy and subsequently hypothyroid  . Tobacco abuse 10/07/2013    Past Surgical History:  Procedure Laterality Date  . COLONOSCOPY N/A 09/26/2016   Procedure: COLONOSCOPY;  Surgeon: Mauri Pole, MD;  Location: [facility] ENDOSCOPY;  Service: Endoscopy;  Laterality: N/A;  . COLONOSCOPY N/A 09/27/2016   Procedure: COLONOSCOPY;  Surgeon: Mauri Pole, MD;  Location: [facility] ENDOSCOPY;  Service: Endoscopy;  Laterality: N/A;  . ESOPHAGOGASTRODUODENOSCOPY N/A 09/26/2016   Procedure: ESOPHAGOGASTRODUODENOSCOPY (EGD);  Surgeon: Mauri Pole, MD;  Location: [facility] ENDOSCOPY;  Service: Endoscopy;  Laterality: N/A;  . THYROID SURGERY      Allergies  Allergen Reactions  . Aspirin Other (See Comments)    Burns line in stomach    Current Outpatient Prescriptions on File Prior to Visit  Medication Sig Dispense Refill  . albuterol (PROVENTIL HFA;VENTOLIN HFA) 108 (90 Base) MCG/ACT inhaler Take 2 puffs 3 times daily for 5 more days and then every 6 hours as needed thereafter. (Patient taking differently: Inhale 2 puffs into the lungs every 6 (six) hours as needed for wheezing or shortness of breath. )  1 Inhaler 2  . albuterol (PROVENTIL) (2.5 MG/3ML) 0.083% nebulizer solution Take 2.5 mg by nebulization 3 (three) times daily.    . diclofenac sodium (VOLTAREN) 1 % GEL Apply 1 application topically 3 (three) times daily as needed (pain).     . DULoxetine (CYMBALTA) 60 MG capsule Take 60 mg by mouth daily before lunch.     . ferrous sulfate 325 (65 FE) MG tablet Take 1 tablet (325 mg total) by mouth 3 (three) times daily with meals. 90 tablet 0  . furosemide (LASIX) 40 MG tablet Take 40 mg  by mouth daily.     Marland Kitchen gabapentin (NEURONTIN) 400 MG capsule Take 800 mg by mouth every 6 (six) hours as needed (pain).     Marland Kitchen levothyroxine (SYNTHROID, LEVOTHROID) 175 MCG tablet Take 175 mcg by mouth daily before breakfast.     . losartan (COZAAR) 50 MG tablet Take 50 mg by mouth daily.    . metFORMIN (GLUCOPHAGE) 500 MG tablet Take 500 mg by mouth 2 (two) times daily.    . metoprolol tartrate (LOPRESSOR) 25 MG tablet Take 25 mg by mouth 2 (two) times daily.     Marland Kitchen omeprazole (PRILOSEC) 40 MG capsule Take 40 mg by mouth daily as needed (heartburn/ acid reflux).     . potassium chloride (K-DUR) 10 MEQ tablet Take 10 mEq by mouth 2 (two) times daily.     . Tiotropium Bromide-Olodaterol (STIOLTO RESPIMAT) 2.5-2.5 MCG/ACT AERS Inhale 1 puff into the lungs daily as needed (shortness of breath).    . traMADol (ULTRAM) 50 MG tablet Take 100 mg by mouth every 6 (six) hours as needed (pain).     . Vitamin D, Ergocalciferol, (DRISDOL) 50000 UNITS CAPS capsule Take 50,000 Units by mouth every Monday.      No current facility-administered medications on file prior to visit.     Objective:   Physical Exam Vitals:   04/29/17 1052  Weight: 188 lb 14.4 oz (85.7 kg)  Height: _0  (1.676 m)  Alert and oriented. Skin warm and dry. Oral mucosa is moist.   . Sclera anicteric, conjunctivae is pink. Thyroid not enlarged. No cervical lymphadenopathy. Lungs clear. Heart regular rate and rhythm.  Abdomen is soft. Bowel sounds are positive. No hepatomegaly. Liver 3 fingerbreadth's below costal margin.  No abdominal masses felt. No tenderness.  No edema to lower extremities.         Assessment & Plan:  Elevated liver enzymes with cirrhosis. Diagnosed in 2016.   Will repeat Hepatic function today. Auto immune process ruled out.  OV in 6 months.

## 2017-04-29 NOTE — Patient Instructions (Addendum)
OV in 6 months. Nonalcoholic Fatty Liver Disease Diet Nonalcoholic fatty liver disease is a condition that causes fat to accumulate in and around the liver. The disease makes it harder for the liver to work the way that it should. Following a healthy diet can help to keep nonalcoholic fatty liver disease under control. It can also help to prevent or improve conditions that are associated with the disease, such as heart disease, diabetes, high blood pressure, and abnormal cholesterol levels. Along with regular exercise, this diet:  Promotes weight loss.  Helps to control blood sugar levels.  Helps to improve the way that the body uses insulin. What do I need to know about this diet?  Use the glycemic index (GI) to plan your meals. The index tells you how quickly a food will raise your blood sugar. Choose low-GI foods. These foods take a longer time to raise blood sugar.  Keep track of how many calories you take in. Eating the right amount of calories will help you to achieve a healthy weight.  You may want to follow a Mediterranean diet. This diet includes a lot of vegetables, lean meats or fish, whole grains, fruits, and healthy oils and fats. What foods can I eat? Grains  Whole grains, such as whole-wheat or whole-grain breads, crackers, tortillas, cereals, and pasta. Stone-ground whole wheat. Pumpernickel bread. Unsweetened oatmeal. Bulgur. Barley. Quinoa. Brown or wild rice. Corn or whole-wheat flour tortillas. Vegetables  Lettuce. Spinach. Peas. Beets. Cauliflower. Cabbage. Broccoli. Carrots. Tomatoes. Squash. Eggplant. Herbs. Peppers. Onions. Cucumbers. Brussels sprouts. Yams and sweet potatoes. Beans. Lentils. Fruits  Bananas. Apples. Oranges. Grapes. Papaya. Mango. Pomegranate. Kiwi. Grapefruit. Cherries. Meats and Other Protein Sources  Seafood and shellfish. Lean meats. Poultry. Tofu. Dairy  Low-fat or fat-free dairy products, such as yogurt, cottage cheese, and cheese. Beverages   Water. Sugar-free drinks. Tea. Coffee. Low-fat or skim milk. Milk alternatives, such as soy or almond milk. Real fruit juice. Condiments  Mustard. Relish. Low-fat, low-sugar ketchup and barbecue sauce. Low-fat or fat-free mayonnaise. Sweets and Desserts  Sugar-free sweets. Fats and Oils  Avocado. Canola or olive oil. Nuts and nut butters. Seeds. The items listed above may not be a complete list of recommended foods or beverages. Contact your dietitian for more options.  What foods are not recommended? Palm oil and coconut oil. Processed foods. Fried foods. Sweetened drinks, such as sweet tea, milkshakes, snow cones, iced sweet drinks, and sodas. Alcohol. Sweets. Foods that contain a lot of salt or sodium. The items listed above may not be a complete list of foods and beverages to avoid. Contact your dietitian for more information.  This information is not intended to replace advice given to you by your health care provider. Make sure you discuss any questions you have with your health care provider. Document Released: 04/25/2015 Document Revised: 05/16/2016 Document Reviewed: 01/03/2015 Elsevier Interactive Patient Education  2017 [employer]. Nonalcoholic Fatty Liver Disease Diet Nonalcoholic fatty liver disease is a condition that causes fat to accumulate in and around the liver. The disease makes it harder for the liver to work the way that it should. Following a healthy diet can help to keep nonalcoholic fatty liver disease under control. It can also help to prevent or improve conditions that are associated with the disease, such as heart disease, diabetes, high blood pressure, and abnormal cholesterol levels. Along with regular exercise, this diet:  Promotes weight loss.  Helps to control blood sugar levels.  Helps to improve the way that the  body uses insulin. What do I need to know about this diet?  Use the glycemic index (GI) to plan your meals. The index tells you how quickly a  food will raise your blood sugar. Choose low-GI foods. These foods take a longer time to raise blood sugar.  Keep track of how many calories you take in. Eating the right amount of calories will help you to achieve a healthy weight.  You may want to follow a Mediterranean diet. This diet includes a lot of vegetables, lean meats or fish, whole grains, fruits, and healthy oils and fats. What foods can I eat? Grains  Whole grains, such as whole-wheat or whole-grain breads, crackers, tortillas, cereals, and pasta. Stone-ground whole wheat. Pumpernickel bread. Unsweetened oatmeal. Bulgur. Barley. Quinoa. Brown or wild rice. Corn or whole-wheat flour tortillas. Vegetables  Lettuce. Spinach. Peas. Beets. Cauliflower. Cabbage. Broccoli. Carrots. Tomatoes. Squash. Eggplant. Herbs. Peppers. Onions. Cucumbers. Brussels sprouts. Yams and sweet potatoes. Beans. Lentils. Fruits  Bananas. Apples. Oranges. Grapes. Papaya. Mango. Pomegranate. Kiwi. Grapefruit. Cherries. Meats and Other Protein Sources  Seafood and shellfish. Lean meats. Poultry. Tofu. Dairy  Low-fat or fat-free dairy products, such as yogurt, cottage cheese, and cheese. Beverages  Water. Sugar-free drinks. Tea. Coffee. Low-fat or skim milk. Milk alternatives, such as soy or almond milk. Real fruit juice. Condiments  Mustard. Relish. Low-fat, low-sugar ketchup and barbecue sauce. Low-fat or fat-free mayonnaise. Sweets and Desserts  Sugar-free sweets. Fats and Oils  Avocado. Canola or olive oil. Nuts and nut butters. Seeds. The items listed above may not be a complete list of recommended foods or beverages. Contact your dietitian for more options.  What foods are not recommended? Palm oil and coconut oil. Processed foods. Fried foods. Sweetened drinks, such as sweet tea, milkshakes, snow cones, iced sweet drinks, and sodas. Alcohol. Sweets. Foods that contain a lot of salt or sodium. The items listed above may not be a complete list of foods  and beverages to avoid. Contact your dietitian for more information.  This information is not intended to replace advice given to you by your health care provider. Make sure you discuss any questions you have with your health care provider. Document Released: 04/25/2015 Document Revised: 05/16/2016 Document Reviewed: 01/03/2015 Elsevier Interactive Patient Education  2017 [employer].

## 2017-05-12 ENCOUNTER — Encounter (INDEPENDENT_AMBULATORY_CARE_PROVIDER_SITE_OTHER): Payer: Self-pay

## 2017-05-20 ENCOUNTER — Telehealth (INDEPENDENT_AMBULATORY_CARE_PROVIDER_SITE_OTHER): Payer: Self-pay | Admitting: Internal Medicine

## 2017-05-20 NOTE — Telephone Encounter (Signed)
Error

## 2017-05-20 NOTE — Telephone Encounter (Signed)
Tammy, ALP isoenzymes, Hepatic in 4 weeks.

## 2017-05-21 ENCOUNTER — Other Ambulatory Visit (INDEPENDENT_AMBULATORY_CARE_PROVIDER_SITE_OTHER): Payer: Self-pay | Admitting: *Deleted

## 2017-05-21 ENCOUNTER — Encounter (INDEPENDENT_AMBULATORY_CARE_PROVIDER_SITE_OTHER): Payer: Self-pay | Admitting: *Deleted

## 2017-05-21 DIAGNOSIS — D509 Iron deficiency anemia, unspecified: Secondary | ICD-10-CM

## 2017-05-21 DIAGNOSIS — K7469 Other cirrhosis of liver: Secondary | ICD-10-CM

## 2017-05-21 DIAGNOSIS — R748 Abnormal levels of other serum enzymes: Secondary | ICD-10-CM

## 2017-05-21 NOTE — Telephone Encounter (Signed)
Labs orders have been placed under the ordering provider, Lelon Perla. A letter has been sent as a reminder to the patient.

## 2017-06-23 LAB — HEPATIC FUNCTION PANEL
ALBUMIN: 3.5 g/dL — AB (ref 3.6–5.1)
ALT: 18 U/L (ref 6–29)
AST: 28 U/L (ref 10–35)
Alkaline Phosphatase: 168 U/L — ABNORMAL HIGH (ref 33–130)
BILIRUBIN DIRECT: 0.2 mg/dL (ref <=0.2)
Indirect Bilirubin: 0.5 mg/dL (ref 0.2–1.2)
TOTAL PROTEIN: 7.1 g/dL (ref 6.1–8.1)
Total Bilirubin: 0.7 mg/dL (ref 0.2–1.2)

## 2017-06-27 LAB — ALKALINE PHOSPHATASE ISOENZYMES
ALKALINE PHOSPHATASE (ALP ISO): 167 U/L — AB (ref 33–130)
Bone Isoenzymes: 40 % (ref 28–66)
INTESTINAL ISOENZYMES (ALP ISO): 8 % (ref 1–24)
Liver Isoenzymes: 52 % (ref 25–69)

## 2017-06-30 ENCOUNTER — Telehealth (INDEPENDENT_AMBULATORY_CARE_PROVIDER_SITE_OTHER): Payer: Self-pay | Admitting: Internal Medicine

## 2017-06-30 ENCOUNTER — Other Ambulatory Visit (INDEPENDENT_AMBULATORY_CARE_PROVIDER_SITE_OTHER): Payer: Self-pay | Admitting: *Deleted

## 2017-06-30 DIAGNOSIS — R748 Abnormal levels of other serum enzymes: Secondary | ICD-10-CM

## 2017-06-30 NOTE — Telephone Encounter (Signed)
err

## 2017-06-30 NOTE — Telephone Encounter (Signed)
Tammy, hepatic in 3 months.  Hope, OV in 6 months

## 2017-06-30 NOTE — Telephone Encounter (Signed)
Lab is noted for September 30 2017. A  Letter will be sent to the patient as a reminder.

## 2017-07-02 ENCOUNTER — Encounter (INDEPENDENT_AMBULATORY_CARE_PROVIDER_SITE_OTHER): Payer: Self-pay | Admitting: Internal Medicine

## 2017-07-02 NOTE — Telephone Encounter (Signed)
Appointment was given for 01/05/18 at 1:45pm.  A letter was mailed to the patient.

## 2017-07-02 NOTE — Progress Notes (Signed)
Appointment was given for 01/05/18 at 1:45pm.  A letter was mailed to the patient.

## 2017-09-18 ENCOUNTER — Other Ambulatory Visit (INDEPENDENT_AMBULATORY_CARE_PROVIDER_SITE_OTHER): Payer: Self-pay | Admitting: *Deleted

## 2017-09-18 ENCOUNTER — Encounter (INDEPENDENT_AMBULATORY_CARE_PROVIDER_SITE_OTHER): Payer: Self-pay | Admitting: *Deleted

## 2017-09-18 DIAGNOSIS — R748 Abnormal levels of other serum enzymes: Secondary | ICD-10-CM

## 2017-10-08 ENCOUNTER — Other Ambulatory Visit (INDEPENDENT_AMBULATORY_CARE_PROVIDER_SITE_OTHER): Payer: Self-pay | Admitting: Internal Medicine

## 2017-10-08 LAB — HEPATIC FUNCTION PANEL
AG RATIO: 1 (calc) (ref 1.0–2.5)
ALBUMIN MSPROF: 3.7 g/dL (ref 3.6–5.1)
ALT: 20 U/L (ref 6–29)
AST: 30 U/L (ref 10–35)
Alkaline phosphatase (APISO): 160 U/L — ABNORMAL HIGH (ref 33–130)
BILIRUBIN DIRECT: 0.2 mg/dL (ref 0.0–0.2)
BILIRUBIN TOTAL: 0.6 mg/dL (ref 0.2–1.2)
Globulin: 3.6 g/dL (calc) (ref 1.9–3.7)
Indirect Bilirubin: 0.4 mg/dL (calc) (ref 0.2–1.2)
Total Protein: 7.3 g/dL (ref 6.1–8.1)

## 2017-10-09 ENCOUNTER — Other Ambulatory Visit (INDEPENDENT_AMBULATORY_CARE_PROVIDER_SITE_OTHER): Payer: Self-pay | Admitting: *Deleted

## 2017-10-09 DIAGNOSIS — R740 Nonspecific elevation of levels of transaminase and lactic acid dehydrogenase [LDH]: Principal | ICD-10-CM

## 2017-10-09 DIAGNOSIS — R748 Abnormal levels of other serum enzymes: Secondary | ICD-10-CM

## 2017-10-09 DIAGNOSIS — R7401 Elevation of levels of liver transaminase levels: Secondary | ICD-10-CM

## 2017-10-30 ENCOUNTER — Ambulatory Visit (INDEPENDENT_AMBULATORY_CARE_PROVIDER_SITE_OTHER): Payer: Self-pay | Admitting: Internal Medicine

## 2018-01-05 ENCOUNTER — Ambulatory Visit (INDEPENDENT_AMBULATORY_CARE_PROVIDER_SITE_OTHER): Payer: Self-pay | Admitting: Internal Medicine

## 2018-01-15 ENCOUNTER — Encounter (INDEPENDENT_AMBULATORY_CARE_PROVIDER_SITE_OTHER): Payer: Self-pay | Admitting: Internal Medicine

## 2018-01-15 ENCOUNTER — Ambulatory Visit (INDEPENDENT_AMBULATORY_CARE_PROVIDER_SITE_OTHER): Payer: Medicare Other | Admitting: Internal Medicine

## 2018-01-15 ENCOUNTER — Encounter (INDEPENDENT_AMBULATORY_CARE_PROVIDER_SITE_OTHER): Payer: Self-pay | Admitting: *Deleted

## 2018-01-15 VITALS — BP 110/58 | HR 68 | Temp 97.6°F | Ht 66.0 in | Wt 194.3 lb

## 2018-01-15 DIAGNOSIS — K760 Fatty (change of) liver, not elsewhere classified: Secondary | ICD-10-CM

## 2018-01-15 NOTE — Patient Instructions (Signed)
Hepatic and US abdomen.  

## 2018-01-15 NOTE — Progress Notes (Signed)
Subjective:    Patient ID: Rhonda Santos, female    DOB: Jun 13, 1946, 72 y.o.   MRN: [id number]  HPI Here today for f/u. Last seen in May of last year with elevated liver enzymes Hx of same in past and per CDT in 2016 and 2017. Has changes of hepatic cirrhosis.  Has been evaluated by Santina Evans last year for elevated liver eyzymes. Auto immune ruled out.  She says she is doing okay. She is walking with a walker. She walks for exercise but very little. Her appetite is good. She has gained from 188 to 194. She c/o back pain today. She has a BM x 1 a day.   CBC Latest Ref Rng & Units 10/24/2016 09/27/2016 09/26/2016  WBC 4.0 - 10.5 K/uL 10.5 7.4 9.4  Hemoglobin 12.0 - 15.0 g/dL 9.3(L) 9.8(L) 9.4(L)  Hematocrit 36.0 - 46.0 % 31.7(L) 33.5(L) 32.3(L)  Platelets 150 - 400 K/uL 307 287 296      06/23/2017  CMP Latest Ref Rng & Units 10/08/2017 06/23/2017 04/29/2017  Glucose 65 - 99 mg/dL - - -  BUN 6 - 20 mg/dL - - -  Creatinine 0.44 - 1.00 mg/dL - - -  Sodium 135 - 145 mmol/L - - -  Potassium 3.5 - 5.1 mmol/L - - -  Chloride 101 - 111 mmol/L - - -  CO2 22 - 32 mmol/L - - -  Calcium 8.9 - 10.3 mg/dL - - -  Total Protein 6.1 - 8.1 g/dL 7.3 7.1 7.0  Total Bilirubin 0.2 - 1.2 mg/dL 0.6 0.7 0.6  Alkaline Phos 33 - 130 U/L - 168(H) 188(H)  AST 10 - 35 U/L 30 28 20   ALT 6 - 29 U/L 20 18 11       09/24/2016 CT abdomen/pelvis with CM:  Decreased hemoglobin/ abdominal pain; IMPRESSION: No acute process demonstrated on noncontrast imaging of the abdomen or pelvis. No evidence for hematoma. Changes of hepatic cirrhosis. Cholelithiasis.   H and H 10.9 and 35.1, MCV 80.9 09/24/2016 Acute Hepatitis Panel: normal.  09/25/2016 Hep C antibody negative. ANA negative, Mitochondrial antibody negative. Hepatitis B surface antigien negative, Hepatitis A antibody positive Vitamin B12 402, ferritin 9, iron 25, TIBC 435.     09/26/2016 EGD: Suspected GI bleed with unexplained IDA.  Normal exam.    09/26/2016 Colonoscopy: Unexplained IDA                            - Two 4 to 7 mm polyps in the rectum and in the                            ascending colon, removed with a cold snare.                            Resected and retrieved.                           - Stool in the entire examined colon. Polyps were hyperplastic  09/27/2016 Colonoscopy:   Impression:               - One 3 mm polyp in the transverse colon, removed  with a cold biopsy forceps. Resected and retrieved.                           - One 15 mm polyp in the ascending colon, removed                            with a hot snare. Resected and retrieved.                           - Non-bleeding internal hemorrhoids. Polyp was hyperplastic      Review of Systems Past Medical History:  Diagnosis Date  . Anemia 09/2016  . Cirrhosis ([place]) 08/2015   Fatty liver seen on imaging of 2014.  Lifetime teetotaler.  . DDD (degenerative disc disease)    With chronic pain  . Diabetes mellitus without complication ([place])   . Diastolic dysfunction 80/9983  . Gallstone 2014   Incidental finding, no complications.  . Hyperlipidemia   . Hypertension   . Hypokalemia 10/06/2013   Secondary to lasix  . Hypoxia 10/07/2013   From mild resp depression from opioids  . Mitral valve prolapse 09/2013   Mild per echo  . Opioid intoxication delirium ([place]) 2015   Unintentional overdose with acute psychoses  . Osteoporosis   . Prolonged Q-T interval on ECG 10/07/2013   Secondary to hypokalemia  . Pulmonary embolism ([place]) 2016   Started on Xarelto  . Thyroid disease    Initially hyperthyroid, question Graves' disease, underwent thyroidectomy and subsequently hypothyroid  . Tobacco abuse 10/07/2013    Past Surgical History:  Procedure Laterality Date  . COLONOSCOPY N/A 09/26/2016   Procedure: COLONOSCOPY;  Surgeon: Mauri Pole, MD;  Location: [facility];  Service: Endoscopy;  Laterality: N/A;  .  COLONOSCOPY N/A 09/27/2016   Procedure: COLONOSCOPY;  Surgeon: Mauri Pole, MD;  Location: [facility];  Service: Endoscopy;  Laterality: N/A;  . ESOPHAGOGASTRODUODENOSCOPY N/A 09/26/2016   Procedure: ESOPHAGOGASTRODUODENOSCOPY (EGD);  Surgeon: Mauri Pole, MD;  Location: [facility] END[facility]COPY;  Service: Endoscopy;  Laterality: N/A;  . THYROID SURGERY      Allergies  Allergen Reactions  . Aspirin Other (See Comments)    Burns line in stomach    Current Outpatient Medications on File Prior to Visit  Medication Sig Dispense Refill  . albuterol (PROVENTIL HFA;VENTOLIN HFA) 108 (90 Base) MCG/ACT inhaler Take 2 puffs 3 times daily for 5 more days and then every 6 hours as needed thereafter. (Patient taking differently: Inhale 2 puffs into the lungs every 6 (six) hours as needed for wheezing or shortness of breath. ) 1 Inhaler 2  . albuterol (PROVENTIL) (2.5 MG/3ML) 0.083% nebulizer solution Take 2.5 mg by nebulization 3 (three) times daily.    Marland Kitchen atorvastatin (LIPITOR) 40 MG tablet Take 40 mg by mouth daily.    . clonazePAM (KLONOPIN) 0.5 MG tablet Take 0.5 mg by mouth daily.    . diclofenac sodium (VOLTAREN) 1 % GEL Apply 1 application topically 3 (three) times daily as needed (pain).     . DULoxetine (CYMBALTA) 60 MG capsule Take 60 mg by mouth daily before lunch.     . ferrous sulfate 325 (65 FE) MG tablet Take 1 tablet (325 mg total) by mouth 3 (three) times daily with meals. 90 tablet 0  . furosemide (LASIX) 40 MG tablet Take 40 mg by mouth daily.     Marland Kitchen  gabapentin (NEURONTIN) 400 MG capsule Take 800 mg by mouth every 6 (six) hours as needed (pain).     Marland Kitchen levothyroxine (SYNTHROID, LEVOTHROID) 175 MCG tablet Take 175 mcg by mouth daily before breakfast.     . losartan (COZAAR) 50 MG tablet Take 50 mg by mouth daily.    . metFORMIN (GLUCOPHAGE) 500 MG tablet Take 500 mg by mouth 2 (two) times daily.    . metoprolol tartrate (LOPRESSOR) 25 MG tablet Take 25 mg by mouth 2 (two) times daily.      Marland Kitchen omeprazole (PRILOSEC) 40 MG capsule Take 40 mg by mouth daily as needed (heartburn/ acid reflux).     . potassium chloride (K-DUR) 10 MEQ tablet Take 10 mEq by mouth 2 (two) times daily.     . Tiotropium Bromide-Olodaterol (STIOLTO RESPIMAT) 2.5-2.5 MCG/ACT AERS Inhale 1 puff into the lungs daily as needed (shortness of breath).    . traMADol (ULTRAM) 50 MG tablet Take 100 mg by mouth every 6 (six) hours as needed (pain).     . Vitamin D, Ergocalciferol, (DRISDOL) 50000 UNITS CAPS capsule Take 50,000 Units by mouth every Monday.      No current facility-administered medications on file prior to visit.         Objective:   Physical Exam Blood pressure (!) 110/58, pulse 68, temperature 97.6 F (36.4 C), height 5\' 6"  (1.676 m), weight 194 lb 4.8 oz (88.1 kg). Alert and oriented. Skin warm and dry. Oral mucosa is moist.   . Sclera anicteric, conjunctivae is pink. Thyroid not enlarged. No cervical lymphadenopathy. Lungs clear. Heart regular rate and rhythm.  Abdomen is soft. Bowel sounds are positive. No hepatomegaly. No abdominal masses felt. No tenderness.  No edema to lower extremities.          Assessment & Plan:  NAFLD/cirrhosis. Will get US abdomen, Hepatic and a CBC OV in 6 months.

## 2018-01-20 ENCOUNTER — Ambulatory Visit (HOSPITAL_COMMUNITY): Admission: RE | Admit: 2018-01-20 | Payer: Medicare Other | Source: Ambulatory Visit

## 2018-02-02 ENCOUNTER — Ambulatory Visit (HOSPITAL_COMMUNITY)
Admission: RE | Admit: 2018-02-02 | Discharge: 2018-02-02 | Disposition: A | Discharge status: Home / Self Care | Payer: Medicare Other | Source: Ambulatory Visit | Attending: Internal Medicine | Admitting: Internal Medicine

## 2018-02-02 DIAGNOSIS — K760 Fatty (change of) liver, not elsewhere classified: Secondary | ICD-10-CM | POA: Insufficient documentation

## 2018-02-02 DIAGNOSIS — K8020 Calculus of gallbladder without cholecystitis without obstruction: Secondary | ICD-10-CM | POA: Insufficient documentation

## 2018-03-11 ENCOUNTER — Ambulatory Visit: Payer: Self-pay | Admitting: Orthopedic Surgery

## 2018-04-21 ENCOUNTER — Ambulatory Visit: Payer: Self-pay | Admitting: Orthopedic Surgery

## 2018-05-04 ENCOUNTER — Encounter: Payer: Self-pay | Admitting: Orthopedic Surgery

## 2018-05-27 ENCOUNTER — Encounter: Payer: Self-pay | Admitting: Orthopedic Surgery

## 2018-07-15 ENCOUNTER — Ambulatory Visit (INDEPENDENT_AMBULATORY_CARE_PROVIDER_SITE_OTHER): Payer: Self-pay | Admitting: Internal Medicine

## 2018-07-15 ENCOUNTER — Encounter (INDEPENDENT_AMBULATORY_CARE_PROVIDER_SITE_OTHER): Payer: Self-pay | Admitting: Internal Medicine

## 2018-07-16 ENCOUNTER — Ambulatory Visit: Payer: Medicare Other | Admitting: Orthopedic Surgery

## 2018-11-21 IMAGING — US US ABDOMEN COMPLETE
1 series · 13 of 25 positions shown · non-contrast
Comparison: 09/24/2016

CLINICAL DATA: Follow-up fatty liver and nodularity

EXAM:
ABDOMEN ULTRASOUND COMPLETE

[Series 1: us abdomen complete · 0.18mm/px · 13 of 97 slices shown]
[im 1/97]
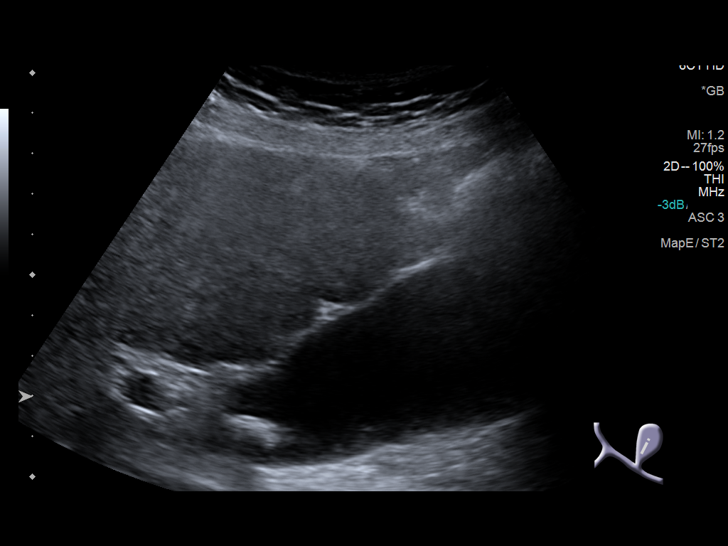
[im 9/97]
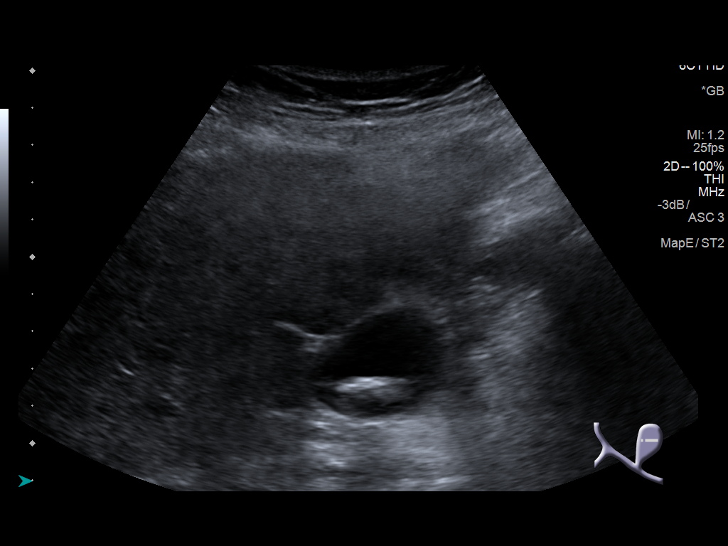
[im 17/97]
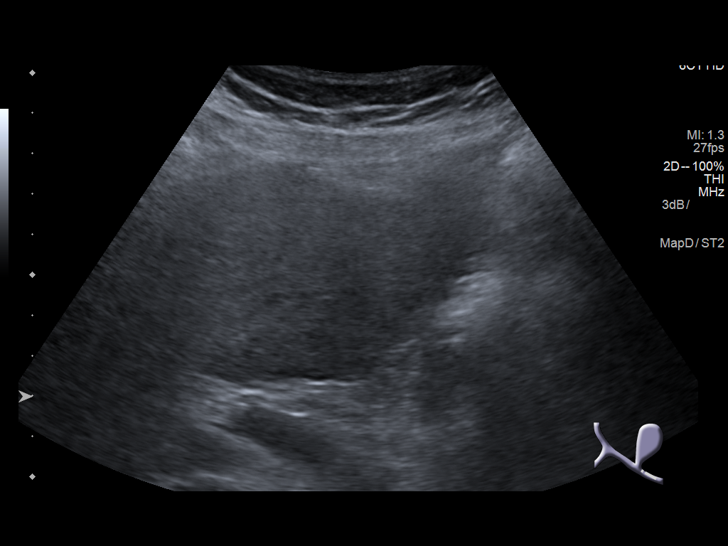
[im 25/97]
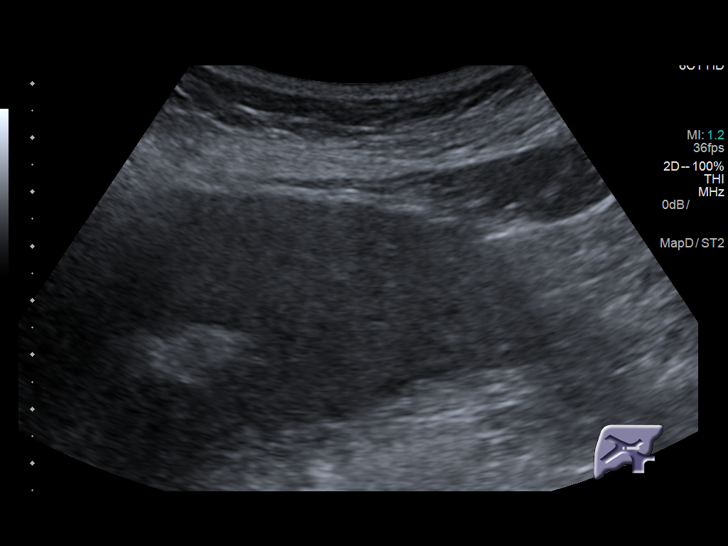
[im 33/97]
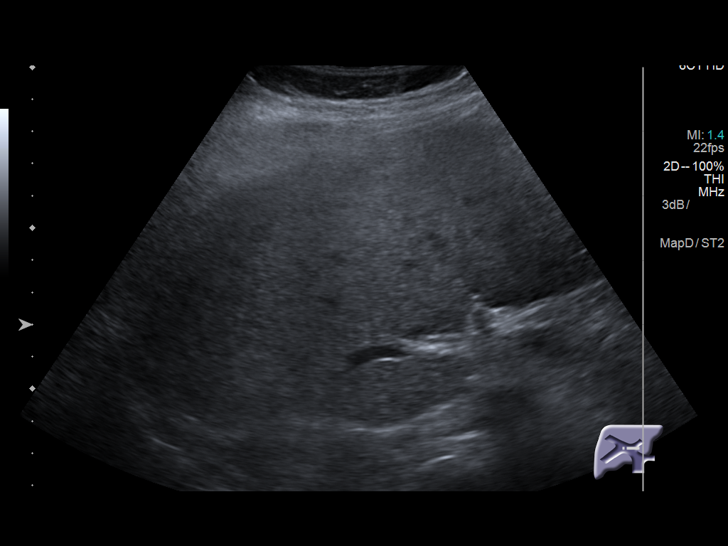
[im 41/97]
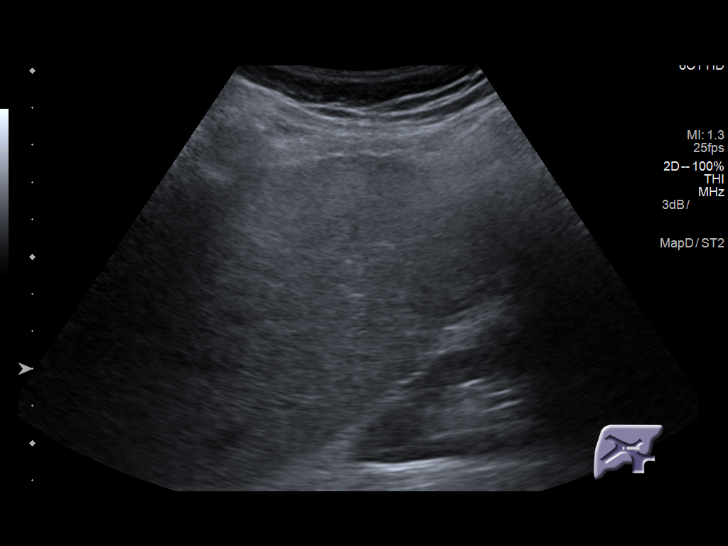
[im 49/97]
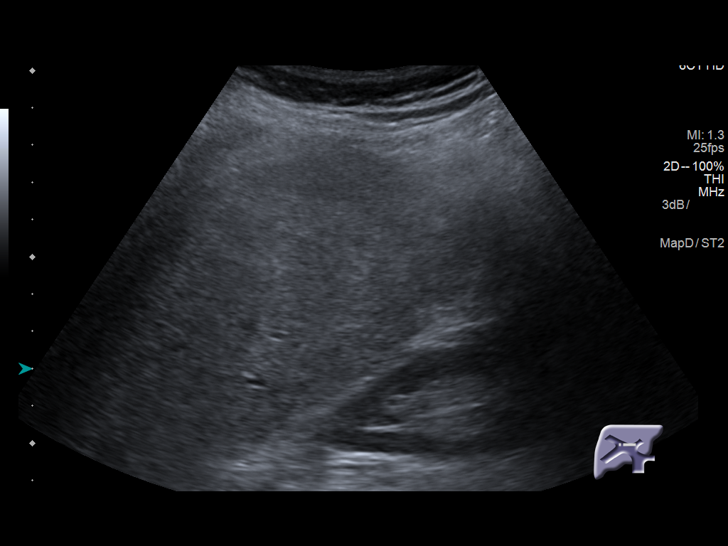
[im 57/97]
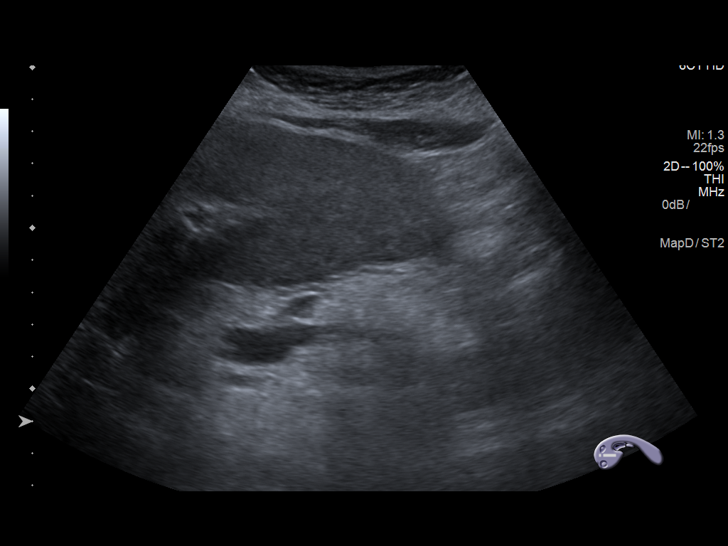
[im 65/97]
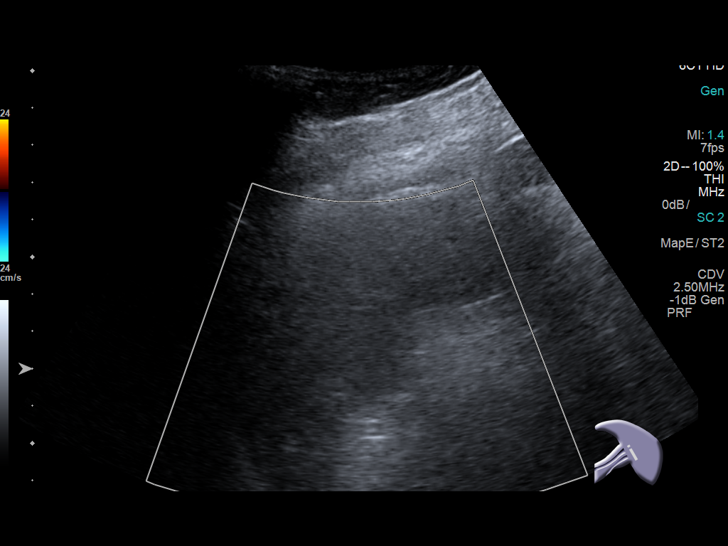
[im 73/97]
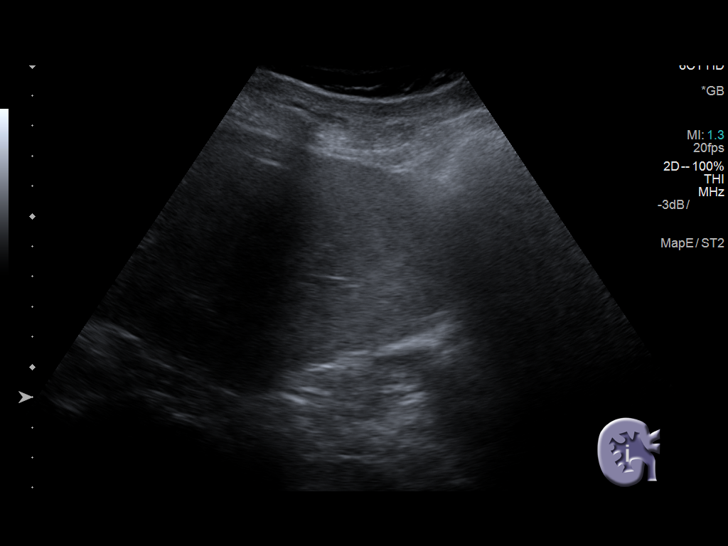
[im 81/97]
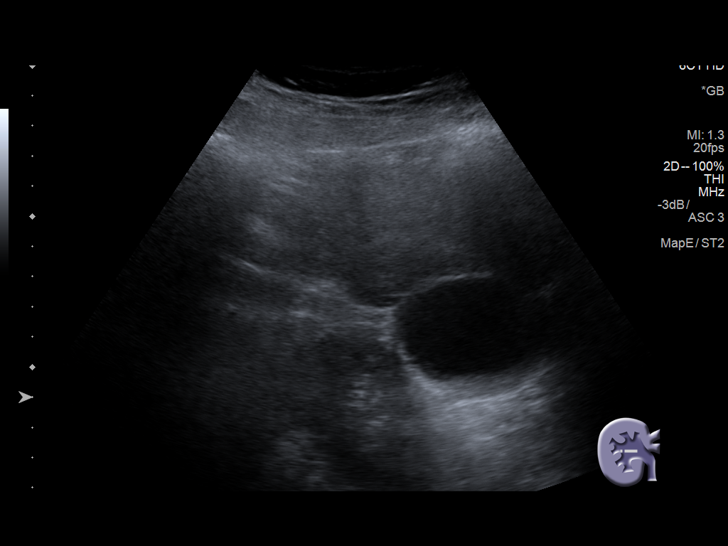
[im 89/97]
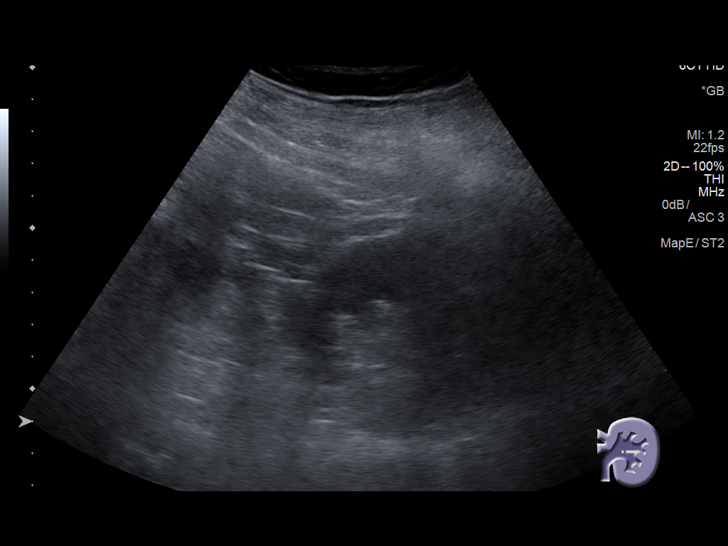
[im 97/97]
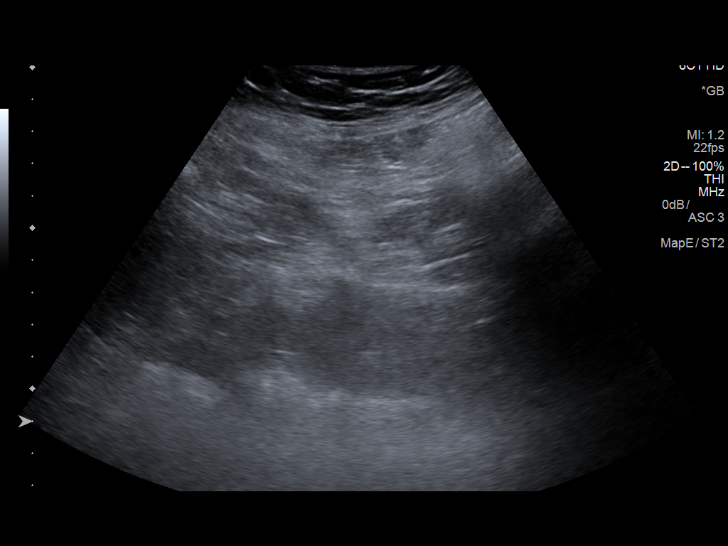

[13 of 25 positions shown; findings below may reference images not displayed]

FINDINGS: Gallbladder: Cholelithiasis is again identified without wall
thickening or pericholecystic fluid.

Common bile duct: Diameter: 3.5 mm.

Liver: Diffuse increased echogenicity is noted consistent with fatty
infiltration. Nodularity to the liver is noted without focal mass
similar to that seen on prior CT examination. Portal vein is patent
on color Doppler imaging with normal direction of blood flow towards
the liver.

IVC: No abnormality visualized.

Pancreas: Visualized portion unremarkable.

Spleen: Size and appearance within normal limits.

Right Kidney: Length: 7.8 cm. The kidney is incompletely evaluated
due to overlying bowel.. Echogenicity within normal limits. No mass
or hydronephrosis visualized.

Left Kidney: Length: 11.3 cm. Echogenicity within normal limits. No
mass or hydronephrosis visualized.

Abdominal aorta: No aneurysm visualized.

Other findings: None.
IMPRESSION: Increased echogenicity within the liver with nodularity consistent
with fatty infiltration. The nodularity would suggest an underlying
degree of cirrhosis as well.

Cholelithiasis without complicating factors.

No other focal abnormality is seen.

## 2019-08-27 ENCOUNTER — Emergency Department (HOSPITAL_COMMUNITY): Payer: Medicare Other

## 2019-08-27 ENCOUNTER — Emergency Department (HOSPITAL_COMMUNITY)
Admission: EM | Admit: 2019-08-27 | Discharge: 2019-08-27 | Disposition: A | Discharge status: Home / Self Care | Payer: Medicare Other | Attending: Emergency Medicine | Admitting: Emergency Medicine

## 2019-08-27 ENCOUNTER — Other Ambulatory Visit: Payer: Self-pay

## 2019-08-27 ENCOUNTER — Encounter (HOSPITAL_COMMUNITY): Payer: Self-pay

## 2019-08-27 DIAGNOSIS — J449 Chronic obstructive pulmonary disease, unspecified: Secondary | ICD-10-CM | POA: Insufficient documentation

## 2019-08-27 DIAGNOSIS — E119 Type 2 diabetes mellitus without complications: Secondary | ICD-10-CM | POA: Diagnosis not present

## 2019-08-27 DIAGNOSIS — R410 Disorientation, unspecified: Secondary | ICD-10-CM | POA: Insufficient documentation

## 2019-08-27 DIAGNOSIS — F17210 Nicotine dependence, cigarettes, uncomplicated: Secondary | ICD-10-CM | POA: Insufficient documentation

## 2019-08-27 DIAGNOSIS — I110 Hypertensive heart disease with heart failure: Secondary | ICD-10-CM | POA: Insufficient documentation

## 2019-08-27 DIAGNOSIS — I10 Essential (primary) hypertension: Secondary | ICD-10-CM | POA: Insufficient documentation

## 2019-08-27 DIAGNOSIS — Z79899 Other long term (current) drug therapy: Secondary | ICD-10-CM | POA: Insufficient documentation

## 2019-08-27 DIAGNOSIS — I5030 Unspecified diastolic (congestive) heart failure: Secondary | ICD-10-CM | POA: Diagnosis not present

## 2019-08-27 DIAGNOSIS — Z7984 Long term (current) use of oral hypoglycemic drugs: Secondary | ICD-10-CM | POA: Insufficient documentation

## 2019-08-27 DIAGNOSIS — E039 Hypothyroidism, unspecified: Secondary | ICD-10-CM | POA: Diagnosis not present

## 2019-08-27 DIAGNOSIS — R4182 Altered mental status, unspecified: Secondary | ICD-10-CM | POA: Diagnosis present

## 2019-08-27 LAB — CBC
HCT: 35.5 % — ABNORMAL LOW (ref 36.0–46.0)
Hemoglobin: 10.4 g/dL — ABNORMAL LOW (ref 12.0–15.0)
MCH: 25.5 pg — ABNORMAL LOW (ref 26.0–34.0)
MCHC: 29.3 g/dL — ABNORMAL LOW (ref 30.0–36.0)
MCV: 87 fL (ref 80.0–100.0)
Platelets: 282 10*3/uL (ref 150–400)
RBC: 4.08 MIL/uL (ref 3.87–5.11)
RDW: 16.5 % — ABNORMAL HIGH (ref 11.5–15.5)
WBC: 7.9 10*3/uL (ref 4.0–10.5)
nRBC: 0 % (ref 0.0–0.2)

## 2019-08-27 LAB — COMPREHENSIVE METABOLIC PANEL
ALT: 26 U/L (ref 0–44)
AST: 37 U/L (ref 15–41)
Albumin: 3.8 g/dL (ref 3.5–5.0)
Alkaline Phosphatase: 147 U/L — ABNORMAL HIGH (ref 38–126)
Anion gap: 11 (ref 5–15)
BUN: 8 mg/dL (ref 8–23)
CO2: 29 mmol/L (ref 22–32)
Calcium: 9.6 mg/dL (ref 8.9–10.3)
Chloride: 98 mmol/L (ref 98–111)
Creatinine, Ser: 1.18 mg/dL — ABNORMAL HIGH (ref 0.44–1.00)
GFR calc Af Amer: 53 mL/min — ABNORMAL LOW (ref >60)
GFR calc non Af Amer: 45 mL/min — ABNORMAL LOW (ref >60)
Glucose, Bld: 190 mg/dL — ABNORMAL HIGH (ref 70–99)
Potassium: 4.6 mmol/L (ref 3.5–5.1)
Sodium: 138 mmol/L (ref 135–145)
Total Bilirubin: 0.4 mg/dL (ref 0.3–1.2)
Total Protein: 8.1 g/dL (ref 6.5–8.1)

## 2019-08-27 LAB — URINALYSIS, ROUTINE W REFLEX MICROSCOPIC
Bilirubin Urine: NEGATIVE
Glucose, UA: NEGATIVE mg/dL
Hgb urine dipstick: NEGATIVE
Ketones, ur: NEGATIVE mg/dL
Nitrite: NEGATIVE
Protein, ur: NEGATIVE mg/dL
Specific Gravity, Urine: 1.005 (ref 1.005–1.030)
pH: 6 (ref 5.0–8.0)

## 2019-08-27 LAB — AMMONIA: Ammonia: 15 umol/L (ref 9–35)

## 2019-08-27 NOTE — ED Notes (Signed)
Family reports that pt has been "talking out of her head for 3 days"

## 2019-08-27 NOTE — ED Triage Notes (Signed)
Pt brought to ED via [place]. EMS called out by son states pt is AMS in am. Pt denies, pt A&O x 4. Pt denies any complaints. Pt states she just needs a routines checkup. Pt states she hasn't had much sleep.

## 2019-08-27 NOTE — ED Provider Notes (Signed)
[facility] EMERGENCY DEPARTMENT Provider Note   CSN: [id number] Arrival date & time: 08/27/19  1126     History   Chief Complaint Chief Complaint  Patient presents with  . Altered Mental Status    HPI Rhonda Santos is a 73 y.o. female.     The history is provided by the patient.  Altered Mental Status Presenting symptoms: confusion   Associated symptoms: no rash and no weakness   Patient brought in.  Reportedly was having difficulty speaking yesterday morning with some confusion.  Patient both tells me she remembers having difficulty talking and tells me she does not remember it happening.  States that she had argument her son about coming in and did come in today because of it.  No headache.  States she feels fine now.  States she is had a good appetite.  Has had a mild cough but states that is from her smoking.  No fevers or chills.  No dysuria.  No abdominal pain.  States she has not been sleeping very well however.  Past Medical History:  Diagnosis Date  . Anemia 09/2016  . Cirrhosis ([place]) 08/2015   Fatty liver seen on imaging of 2014.  Lifetime teetotaler.  . DDD (degenerative disc disease)    With chronic pain  . Diabetes mellitus without complication ([place])   . Diastolic dysfunction 99991111  . Gallstone 2014   Incidental finding, no complications.  . Hyperlipidemia   . Hypertension   . Hypokalemia 10/06/2013   Secondary to lasix  . Hypoxia 10/07/2013   From mild resp depression from opioids  . Mitral valve prolapse 09/2013   Mild per echo  . Opioid intoxication delirium ([place]) 2015   Unintentional overdose with acute psychoses  . Osteoporosis   . Prolonged Q-T interval on ECG 10/07/2013   Secondary to hypokalemia  . Pulmonary embolism ([place]) 2016   Started on Xarelto  . Thyroid disease    Initially hyperthyroid, question Graves' disease, underwent thyroidectomy and subsequently hypothyroid  . Tobacco abuse 10/07/2013    Patient Active Problem List   Diagnosis Date Noted  . Iron deficiency anemia   . Symptomatic anemia 09/24/2016  . Abdominal pain   . COPD exacerbation ([place]) 12/25/2015  . Acute respiratory failure with hypoxia ([place]) 12/25/2015  . History of pulmonary embolus ([place]) 12/25/2015  . Chronic anticoagulation 12/25/2015  . Hypothyroidism 12/25/2015  . HTN (hypertension) 12/25/2015  . GERD (gastroesophageal reflux disease) 12/25/2015  . Acute abdominal pain   . 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 Sepsis with hypotension ([place]) 09/05/2015  . Elevated lactic acid level 09/05/2015  . Abdominal pain, acute 09/05/2015  . Acute renal failure superimposed on stage 3 chronic kidney disease ([place]) 09/05/2015  . History of pulmonary embolism/ July 23, 2015 09/05/2015  . Anemia 09/05/2015  . Blood poisoning   . 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 Diabetes mellitus type 2, uncontrolled, without complications ([place]) 99991111  . Prolonged Q-T interval on ECG 10/07/2013  . Metabolic alkalosis [id number]  . Tobacco abuse 10/07/2013  . Transaminitis 10/07/2013  . Left ventricular diastolic dysfunction, NYHA class 2 10/07/2013  . Hypokalemia 10/06/2013  . Weakness 10/06/2013  . Altered mental status 10/06/2013    Past Surgical History:  Procedure Laterality Date  . COLONOSCOPY N/A 09/26/2016   Procedure: COLONOSCOPY;  Surgeon: Mauri Pole, MD;  Location: [facility] ENDOSCOPY;  Service: Endoscopy;  Laterality: N/A;  . COLONOSCOPY N/A 09/27/2016   Procedure: COLONOSCOPY;  Surgeon:  Mauri Pole, MD;  Location: [facility] ENDOSCOPY;  Service: Endoscopy;  Laterality: N/A;  . ESOPHAGOGASTRODUODENOSCOPY N/A 09/26/2016   Procedure: ESOPHAGOGASTRODUODENOSCOPY (EGD);  Surgeon: Mauri Pole, MD;  Location: [facility] ENDOSCOPY;  Service: Endoscopy;  Laterality: N/A;  . THYROID SURGERY       OB History   No obstetric history on file.      Home Medications    Prior to Admission medications    Medication Sig Start Date End Date Taking? Authorizing Provider  albuterol (PROVENTIL HFA;VENTOLIN HFA) 108 (90 Base) MCG/ACT inhaler Take 2 puffs 3 times daily for 5 more days and then every 6 hours as needed thereafter. Patient taking differently: Inhale 2 puffs into the lungs every 6 (six) hours as needed for wheezing or shortness of breath.  12/27/15   Rexene Alberts, MD  albuterol (PROVENTIL) (2.5 MG/3ML) 0.083% nebulizer solution Take 2.5 mg by nebulization 3 (three) times daily. 08/19/16   [provider]  atorvastatin (LIPITOR) 40 MG tablet Take 40 mg by mouth daily.    [provider]  clonazePAM (KLONOPIN) 0.5 MG tablet Take 0.5 mg by mouth daily.    [provider]  diclofenac sodium (VOLTAREN) 1 % GEL Apply 1 application topically 3 (three) times daily as needed (pain).  08/16/16   [provider]  DULoxetine (CYMBALTA) 60 MG capsule Take 60 mg by mouth daily before lunch.  06/08/15   [provider]  ferrous sulfate 325 (65 FE) MG tablet Take 1 tablet (325 mg total) by mouth 3 (three) times daily with meals. 09/27/16   Ghimire, Henreitta Leber, MD  furosemide (LASIX) 40 MG tablet Take 40 mg by mouth daily.     [provider]  gabapentin (NEURONTIN) 400 MG capsule Take 800 mg by mouth every 6 (six) hours as needed (pain).  09/09/16   [provider]  levothyroxine (SYNTHROID, LEVOTHROID) 175 MCG tablet Take 175 mcg by mouth daily before breakfast.  09/28/13   [provider]  losartan (COZAAR) 50 MG tablet Take 50 mg by mouth daily. 08/07/16   [provider]  metFORMIN (GLUCOPHAGE) 500 MG tablet Take 500 mg by mouth 2 (two) times daily. 09/13/16   [provider]  metoprolol tartrate (LOPRESSOR) 25 MG tablet Take 25 mg by mouth 2 (two) times daily.  08/24/13   [provider]  omeprazole (PRILOSEC) 40 MG capsule Take 40 mg by mouth daily as needed (heartburn/ acid reflux).  09/28/13   [provider]  potassium chloride (K-DUR) 10 MEQ tablet Take 10 mEq by mouth 2 (two) times daily.     [provider]  Tiotropium Bromide-Olodaterol (STIOLTO RESPIMAT) 2.5-2.5 MCG/ACT AERS Inhale 1 puff into the lungs daily as needed (shortness of breath).    [provider]  traMADol (ULTRAM) 50 MG tablet Take 100 mg by mouth every 6 (six) hours as needed (pain).  09/09/16   [provider]  Vitamin D, Ergocalciferol, (DRISDOL) 50000 UNITS CAPS capsule Take 50,000 Units by mouth every Monday.  07/17/15   [provider]    Family History Family History  Problem Relation Age of Onset  . Schizophrenia Maternal Grandfather     Social History Social History   Tobacco Use  . Smoking status: Current Every Day Smoker    Packs/day: 1.00    Years: 37.00    Pack years: 37.00    Types: Cigarettes  . Smokeless tobacco: Never Used  Substance Use Topics  . Alcohol use: No  .  Drug use: No     Allergies   Aspirin   Review of Systems Review of Systems  Constitutional: Negative for appetite change.  HENT: Negative for congestion.   Respiratory: Positive for cough. Negative for shortness of breath.   Gastrointestinal: Negative for abdominal distention.  Genitourinary: Negative for flank pain.  Musculoskeletal: Negative for back pain.  Skin: Negative for rash.  Neurological: Positive for speech difficulty. Negative for weakness.  Psychiatric/Behavioral: Positive for confusion.     Physical Exam Updated Vital Signs BP (!) 165/69 (BP Location: Left Arm)   Pulse 66   Temp 98.3 F (36.8 C) (Oral)   Resp 19   Ht 5\' 5"  (1.651 m)   Wt 90.7 kg   SpO2 97%   BMI 33.28 kg/m   Physical Exam Vitals signs and nursing note reviewed.  HENT:     Head: Atraumatic.  Eyes:     Pupils: Pupils are equal, round, and reactive to light.  Neck:     Musculoskeletal: Neck supple.  Cardiovascular:     Rate and Rhythm: Regular rhythm.  Pulmonary:     Effort: No  respiratory distress.     Breath sounds: No wheezing, rhonchi or rales.  Abdominal:     Tenderness: There is no abdominal tenderness.  Musculoskeletal:        General: No tenderness.  Skin:    General: Skin is warm.     Capillary Refill: Capillary refill takes less than 2 seconds.  Neurological:     Mental Status: She is alert and oriented to person, place, and time. Mental status is at baseline.     Comments: Normal speech.  Face symmetric.  Eye movements intact.  Moving bilateral extremities equally.  Appears awake and able answer questions.      ED Treatments / Results  Labs (all labs ordered are listed, but only abnormal results are displayed) Labs Reviewed  URINALYSIS, ROUTINE W REFLEX MICROSCOPIC - Abnormal; Notable for the following components:      Result Value   APPearance HAZY (*)    Leukocytes,Ua TRACE (*)    Bacteria, UA RARE (*)    All other components within normal limits  COMPREHENSIVE METABOLIC PANEL - Abnormal; Notable for the following components:   Glucose, Bld 190 (*)    Creatinine, Ser 1.18 (*)    Alkaline Phosphatase 147 (*)    GFR calc non Af Amer 45 (*)    GFR calc Af Amer 53 (*)    All other components within normal limits  CBC - Abnormal; Notable for the following components:   Hemoglobin 10.4 (*)    HCT 35.5 (*)    MCH 25.5 (*)    MCHC 29.3 (*)    RDW 16.5 (*)    All other components within normal limits  AMMONIA    EKG None  Radiology Dg Chest 2 View  Result Date: 08/27/2019 CLINICAL DATA:  Weakness and episode of altered mental status yesterday. Smoker. Cough. EXAM: CHEST - 2 VIEW COMPARISON:  09/23/2016. FINDINGS: Normal sized heart with an interval decrease in size. Tortuous aorta. Mildly progressive linear density at both lung bases. Otherwise, clear lungs with normal vascularity. Unremarkable bones IMPRESSION: Mildly progressive bibasilar linear atelectasis or scarring. Electronically Signed   By: Claudie Revering M.D.   On: 08/27/2019  16:10   Ct Head Wo Contrast  Result Date: 08/27/2019 CLINICAL DATA:  Focal neural deficit for more than 6 hours, suspected stroke. EXAM: CT HEAD WITHOUT CONTRAST TECHNIQUE: Contiguous axial images  were obtained from the base of the skull through the vertex without intravenous contrast. COMPARISON:  04/27/2014. FINDINGS: Brain: Mild to moderate diffuse enlargement of the ventricles and subarachnoid spaces with mild progression. Minimal patchy white matter low density in both cerebral hemispheres. No intracranial hemorrhage, mass lesion or CT evidence of acute infarction. Vascular: No hyperdense vessel or unexpected calcification. Skull: Normal. Negative for fracture or focal lesion. Sinuses/Orbits: Status post cataract extraction on the left. Surgical absence of a portion of the medial wall of the right maxillary sinus. Otherwise, unremarkable orbits and included paranasal sinuses. Other: None. IMPRESSION: 1. No acute abnormality. 2. Mild to moderate diffuse cerebral and cerebellar atrophy with mild progression. 3. Minimal chronic small vessel white matter ischemic changes in both cerebral hemispheres. Electronically Signed   By: Claudie Revering M.D.   On: 08/27/2019 16:13    Procedures Procedures (including critical care time)  Medications Ordered in ED Medications - No data to display   Initial Impression / Assessment and Plan / ED Course  I have reviewed the triage vital signs and the nursing notes.  Pertinent labs & imaging results that were available during my care of the patient were reviewed by me and considered in my medical decision making (see chart for details).        Patient brought in for mental status change.  Initially reported is just having some slurred speech or difficulty speaking but later discussing with family had more confusion.  Work-up overall reassuring.  May have some mild dehydration.  No infection.  Head CT reassuring.  Normal ammonia.  Think reasonable for discharge  home.  Follow-up as needed as an outpatient.  Final Clinical Impressions(s) / ED Diagnoses   Final diagnoses:  Confusion    ED Discharge Orders    None       Davonna Belling, MD 08/27/19 2257

## 2019-08-27 NOTE — ED Notes (Signed)
Pt transported to CT ?

## 2019-12-28 ENCOUNTER — Telehealth: Payer: Self-pay | Admitting: "Endocrinology

## 2019-12-28 NOTE — Telephone Encounter (Signed)
Received referral on patient from [facility] 08/16/19. Tried to reach patient 08/17/19. ON 08/26/19 , I spoke with her husband who said he would have her call to schedule. Tried to reach patient back on 11/24/19. Closing referral

## 2020-08-31 ENCOUNTER — Emergency Department (HOSPITAL_COMMUNITY): Payer: Medicare PPO

## 2020-08-31 ENCOUNTER — Other Ambulatory Visit: Payer: Self-pay

## 2020-08-31 ENCOUNTER — Observation Stay (HOSPITAL_COMMUNITY)
Admission: EM | Admit: 2020-08-31 | Discharge: 2020-09-01 | Disposition: A | Discharge status: Home / Self Care | Payer: Medicare PPO | Attending: Internal Medicine | Admitting: Internal Medicine

## 2020-08-31 ENCOUNTER — Encounter (HOSPITAL_COMMUNITY): Payer: Self-pay

## 2020-08-31 DIAGNOSIS — K219 Gastro-esophageal reflux disease without esophagitis: Secondary | ICD-10-CM | POA: Diagnosis present

## 2020-08-31 DIAGNOSIS — Z86711 Personal history of pulmonary embolism: Secondary | ICD-10-CM | POA: Diagnosis not present

## 2020-08-31 DIAGNOSIS — E669 Obesity, unspecified: Secondary | ICD-10-CM | POA: Diagnosis not present

## 2020-08-31 DIAGNOSIS — E1122 Type 2 diabetes mellitus with diabetic chronic kidney disease: Secondary | ICD-10-CM | POA: Diagnosis not present

## 2020-08-31 DIAGNOSIS — Z7984 Long term (current) use of oral hypoglycemic drugs: Secondary | ICD-10-CM | POA: Insufficient documentation

## 2020-08-31 DIAGNOSIS — R001 Bradycardia, unspecified: Secondary | ICD-10-CM | POA: Diagnosis not present

## 2020-08-31 DIAGNOSIS — E039 Hypothyroidism, unspecified: Secondary | ICD-10-CM | POA: Diagnosis not present

## 2020-08-31 DIAGNOSIS — I10 Essential (primary) hypertension: Secondary | ICD-10-CM | POA: Diagnosis not present

## 2020-08-31 DIAGNOSIS — N183 Chronic kidney disease, stage 3 unspecified: Secondary | ICD-10-CM | POA: Insufficient documentation

## 2020-08-31 DIAGNOSIS — F17210 Nicotine dependence, cigarettes, uncomplicated: Secondary | ICD-10-CM | POA: Diagnosis not present

## 2020-08-31 DIAGNOSIS — J441 Chronic obstructive pulmonary disease with (acute) exacerbation: Secondary | ICD-10-CM | POA: Insufficient documentation

## 2020-08-31 DIAGNOSIS — Z79899 Other long term (current) drug therapy: Secondary | ICD-10-CM | POA: Diagnosis not present

## 2020-08-31 DIAGNOSIS — Z20822 Contact with and (suspected) exposure to covid-19: Secondary | ICD-10-CM | POA: Diagnosis not present

## 2020-08-31 DIAGNOSIS — E1165 Type 2 diabetes mellitus with hyperglycemia: Secondary | ICD-10-CM | POA: Diagnosis not present

## 2020-08-31 DIAGNOSIS — I130 Hypertensive heart and chronic kidney disease with heart failure and stage 1 through stage 4 chronic kidney disease, or unspecified chronic kidney disease: Secondary | ICD-10-CM | POA: Diagnosis not present

## 2020-08-31 DIAGNOSIS — I5032 Chronic diastolic (congestive) heart failure: Secondary | ICD-10-CM | POA: Insufficient documentation

## 2020-08-31 DIAGNOSIS — R55 Syncope and collapse: Secondary | ICD-10-CM

## 2020-08-31 DIAGNOSIS — D649 Anemia, unspecified: Secondary | ICD-10-CM | POA: Insufficient documentation

## 2020-08-31 DIAGNOSIS — N179 Acute kidney failure, unspecified: Secondary | ICD-10-CM | POA: Diagnosis not present

## 2020-08-31 DIAGNOSIS — D509 Iron deficiency anemia, unspecified: Secondary | ICD-10-CM | POA: Diagnosis present

## 2020-08-31 DIAGNOSIS — E119 Type 2 diabetes mellitus without complications: Secondary | ICD-10-CM

## 2020-08-31 LAB — URINALYSIS, ROUTINE W REFLEX MICROSCOPIC
Bilirubin Urine: NEGATIVE
Glucose, UA: NEGATIVE mg/dL
Ketones, ur: NEGATIVE mg/dL
Nitrite: NEGATIVE
Protein, ur: 30 mg/dL — AB
Specific Gravity, Urine: 1.012 (ref 1.005–1.030)
pH: 5 (ref 5.0–8.0)

## 2020-08-31 LAB — RETICULOCYTES
Immature Retic Fract: 23.2 % — ABNORMAL HIGH (ref 2.3–15.9)
RBC.: 3.73 MIL/uL — ABNORMAL LOW (ref 3.87–5.11)
Retic Count, Absolute: 58.2 10*3/uL (ref 19.0–186.0)
Retic Ct Pct: 1.6 % (ref 0.4–3.1)

## 2020-08-31 LAB — COMPREHENSIVE METABOLIC PANEL
ALT: 19 U/L (ref 0–44)
AST: 27 U/L (ref 15–41)
Albumin: 3.1 g/dL — ABNORMAL LOW (ref 3.5–5.0)
Alkaline Phosphatase: 99 U/L (ref 38–126)
Anion gap: 14 (ref 5–15)
BUN: 9 mg/dL (ref 8–23)
CO2: 21 mmol/L — ABNORMAL LOW (ref 22–32)
Calcium: 8.8 mg/dL — ABNORMAL LOW (ref 8.9–10.3)
Chloride: 103 mmol/L (ref 98–111)
Creatinine, Ser: 1.55 mg/dL — ABNORMAL HIGH (ref 0.44–1.00)
GFR calc Af Amer: 38 mL/min — ABNORMAL LOW (ref >60)
GFR calc non Af Amer: 32 mL/min — ABNORMAL LOW (ref >60)
Glucose, Bld: 241 mg/dL — ABNORMAL HIGH (ref 70–99)
Potassium: 4.6 mmol/L (ref 3.5–5.1)
Sodium: 138 mmol/L (ref 135–145)
Total Bilirubin: 0.5 mg/dL (ref 0.3–1.2)
Total Protein: 6.7 g/dL (ref 6.5–8.1)

## 2020-08-31 LAB — ECHOCARDIOGRAM COMPLETE
AR max vel: 1.71 cm2
AV Area VTI: 1.79 cm2
AV Area mean vel: 1.67 cm2
AV Mean grad: 7 mmHg
AV Peak grad: 11.6 mmHg
Ao pk vel: 1.7 m/s
Area-P 1/2: 2.54 cm2
Height: 65 in
S' Lateral: 2 cm
Weight: 3200 oz

## 2020-08-31 LAB — CBC WITH DIFFERENTIAL/PLATELET
Abs Immature Granulocytes: 0.03 10*3/uL (ref 0.00–0.07)
Basophils Absolute: 0 10*3/uL (ref 0.0–0.1)
Basophils Relative: 1 %
Eosinophils Absolute: 0.2 10*3/uL (ref 0.0–0.5)
Eosinophils Relative: 4 %
HCT: 29.9 % — ABNORMAL LOW (ref 36.0–46.0)
Hemoglobin: 8.4 g/dL — ABNORMAL LOW (ref 12.0–15.0)
Immature Granulocytes: 1 %
Lymphocytes Relative: 25 %
Lymphs Abs: 1.5 10*3/uL (ref 0.7–4.0)
MCH: 22.4 pg — ABNORMAL LOW (ref 26.0–34.0)
MCHC: 28.1 g/dL — ABNORMAL LOW (ref 30.0–36.0)
MCV: 79.7 fL — ABNORMAL LOW (ref 80.0–100.0)
Monocytes Absolute: 0.3 10*3/uL (ref 0.1–1.0)
Monocytes Relative: 5 %
Neutro Abs: 3.9 10*3/uL (ref 1.7–7.7)
Neutrophils Relative %: 64 %
Platelets: 305 10*3/uL (ref 150–400)
RBC: 3.75 MIL/uL — ABNORMAL LOW (ref 3.87–5.11)
RDW: 19.6 % — ABNORMAL HIGH (ref 11.5–15.5)
WBC: 6.1 10*3/uL (ref 4.0–10.5)
nRBC: 0 % (ref 0.0–0.2)

## 2020-08-31 LAB — SARS CORONAVIRUS 2 BY RT PCR (HOSPITAL ORDER, PERFORMED IN ~~LOC~~ HOSPITAL LAB): SARS Coronavirus 2: NEGATIVE

## 2020-08-31 LAB — MAGNESIUM: Magnesium: 2 mg/dL (ref 1.7–2.4)

## 2020-08-31 LAB — IRON AND TIBC
Iron: 39 ug/dL (ref 28–170)
Saturation Ratios: 8 % — ABNORMAL LOW (ref 10.4–31.8)
TIBC: 487 ug/dL — ABNORMAL HIGH (ref 250–450)
UIBC: 450 ug/dL

## 2020-08-31 LAB — GLUCOSE, CAPILLARY
Glucose-Capillary: 127 mg/dL — ABNORMAL HIGH (ref 70–99)
Glucose-Capillary: 161 mg/dL — ABNORMAL HIGH (ref 70–99)

## 2020-08-31 LAB — HEMOGLOBIN A1C
Hgb A1c MFr Bld: 7.3 % — ABNORMAL HIGH (ref 4.8–5.6)
Mean Plasma Glucose: 162.81 mg/dL

## 2020-08-31 LAB — VITAMIN B12: Vitamin B-12: 359 pg/mL (ref 180–914)

## 2020-08-31 LAB — TROPONIN I (HIGH SENSITIVITY)
Troponin I (High Sensitivity): 9 ng/L (ref <18)
Troponin I (High Sensitivity): 8 ng/L (ref <18)

## 2020-08-31 LAB — FOLATE: Folate: 26.1 ng/mL (ref >5.9)

## 2020-08-31 LAB — T4, FREE: Free T4: 0.4 ng/dL — ABNORMAL LOW (ref 0.61–1.12)

## 2020-08-31 LAB — POC OCCULT BLOOD, ED: Fecal Occult Bld: NEGATIVE

## 2020-08-31 LAB — BRAIN NATRIURETIC PEPTIDE: B Natriuretic Peptide: 162.8 pg/mL — ABNORMAL HIGH (ref 0.0–100.0)

## 2020-08-31 LAB — FERRITIN: Ferritin: 7 ng/mL — ABNORMAL LOW (ref 11–307)

## 2020-08-31 LAB — TSH: TSH: 31.547 u[IU]/mL — ABNORMAL HIGH (ref 0.350–4.500)

## 2020-08-31 MED ORDER — ACETAMINOPHEN 325 MG PO TABS: 650.0000 mg | ORAL_TABLET | Freq: Four times a day (QID) | ORAL | Status: DC | PRN

## 2020-08-31 MED ORDER — ENOXAPARIN SODIUM 40 MG/0.4ML ~~LOC~~ SOLN
40.0000 mg | SUBCUTANEOUS | Status: DC
  Administered 2020-08-31 – 2020-09-01 (×2): 40 mg via SUBCUTANEOUS
  Filled 2020-08-31 (×2): qty 0.4

## 2020-08-31 MED ORDER — INSULIN ASPART 100 UNIT/ML ~~LOC~~ SOLN
0.0000 [IU] | Freq: Three times a day (TID) | SUBCUTANEOUS | Status: DC
  Administered 2020-08-31: 2 [IU] via SUBCUTANEOUS
  Administered 2020-09-01: 5 [IU] via SUBCUTANEOUS
  Administered 2020-09-01: 2 [IU] via SUBCUTANEOUS

## 2020-08-31 MED ORDER — ACETAMINOPHEN 650 MG RE SUPP: 650.0000 mg | Freq: Four times a day (QID) | RECTAL | Status: DC | PRN

## 2020-08-31 MED ORDER — PERFLUTREN LIPID MICROSPHERE
1.0000 mL | INTRAVENOUS | Status: AC | PRN
  Administered 2020-08-31: 3 mL via INTRAVENOUS
  Filled 2020-08-31: qty 10

## 2020-08-31 MED ORDER — TRAMADOL HCL 50 MG PO TABS
50.0000 mg | ORAL_TABLET | Freq: Four times a day (QID) | ORAL | Status: DC | PRN
  Administered 2020-08-31: 50 mg via ORAL
  Filled 2020-08-31: qty 1

## 2020-08-31 MED ORDER — LEVOTHYROXINE SODIUM 75 MCG PO TABS
75.0000 ug | ORAL_TABLET | Freq: Every day | ORAL | Status: DC
  Administered 2020-09-01: 75 ug via ORAL
  Filled 2020-08-31: qty 1

## 2020-08-31 MED ORDER — INSULIN ASPART 100 UNIT/ML ~~LOC~~ SOLN: 0.0000 [IU] | Freq: Every day | SUBCUTANEOUS | Status: DC

## 2020-08-31 MED ORDER — PANTOPRAZOLE SODIUM 40 MG PO TBEC
40.0000 mg | DELAYED_RELEASE_TABLET | Freq: Every day | ORAL | Status: DC
  Administered 2020-08-31 – 2020-09-01 (×2): 40 mg via ORAL
  Filled 2020-08-31 (×3): qty 1

## 2020-08-31 MED ORDER — ALBUTEROL SULFATE (2.5 MG/3ML) 0.083% IN NEBU: 2.5000 mg | INHALATION_SOLUTION | RESPIRATORY_TRACT | Status: DC | PRN

## 2020-08-31 MED ORDER — SODIUM CHLORIDE 0.9% FLUSH
3.0000 mL | Freq: Two times a day (BID) | INTRAVENOUS | Status: DC
  Administered 2020-08-31 – 2020-09-01 (×2): 3 mL via INTRAVENOUS

## 2020-08-31 MED ORDER — GABAPENTIN 300 MG PO CAPS: 300.0000 mg | ORAL_CAPSULE | Freq: Three times a day (TID) | ORAL | Status: DC | PRN

## 2020-08-31 MED ORDER — DULOXETINE HCL 60 MG PO CPEP
60.0000 mg | ORAL_CAPSULE | Freq: Every day | ORAL | Status: DC
  Administered 2020-09-01: 60 mg via ORAL
  Filled 2020-08-31: qty 1

## 2020-08-31 MED ORDER — CLONAZEPAM 0.5 MG PO TABS
0.5000 mg | ORAL_TABLET | Freq: Every day | ORAL | Status: DC
  Administered 2020-09-01: 0.5 mg via ORAL
  Filled 2020-08-31: qty 1

## 2020-08-31 MED ORDER — ATORVASTATIN CALCIUM 40 MG PO TABS
40.0000 mg | ORAL_TABLET | Freq: Every day | ORAL | Status: DC
  Administered 2020-08-31 – 2020-09-01 (×2): 40 mg via ORAL
  Filled 2020-08-31 (×2): qty 1

## 2020-08-31 MED ORDER — GABAPENTIN 300 MG PO CAPS
300.0000 mg | ORAL_CAPSULE | Freq: Three times a day (TID) | ORAL | Status: DC | PRN
  Administered 2020-08-31: 300 mg via ORAL
  Filled 2020-08-31: qty 1

## 2020-08-31 MED ORDER — SODIUM CHLORIDE 0.9 % IV SOLN: INTRAVENOUS | Status: DC

## 2020-08-31 NOTE — ED Notes (Signed)
Pt hooked up top pure wick

## 2020-08-31 NOTE — Progress Notes (Signed)
  Echocardiogram 2D Echocardiogram has been performed.  Rhonda Santos 08/31/2020, 2:11 PM

## 2020-08-31 NOTE — H&P (Signed)
History and Physical    Rhonda Santos GHW:[id number] DOB: [birth year] DOA: 08/31/2020  PCP: Vidal Schwalbe, MD  Patient coming from: home  I have personally briefly reviewed patient's old medical records in [place]  Chief Complaint: " I was about to pass out"  HPI: Rhonda Santos is a 74 y.o. female with medical history significant of hypertension, hyperlipidemia, type 2 diabetes mellitus, hypothyroidism, cirrhosis secondary to Karlene Lineman, CKD stage III, anxiety/depression, morbid obesity, tobacco abuse, COPD(on PRN oxygen at home), GERD, chronic diastolic CHF presents to emergency department for evaluation of near syncope.  Patient tells me that last night when she woke up from sleep to go to bathroom and while she was ambulating she felt that she was going to pass out and felt extremely weak and nauseous.  Her family called EMS and was found to have severe bradycardia with a heart rate in low 20s and low blood pressure in 80s.  EMS placed patient in route to ED.  Her heart rate improved in 50s/60s.    She denies head trauma, seizures, loss of consciousness, slurred speech, headache, blurry vision, chest pain, shortness of breath, palpitation, leg swelling, fever, chills, cough, congestion, vomiting, diarrhea, abdominal pain, urinary or bowel changes.  Her daughter lives with her.  She smokes 1 to 2 pack of cigarettes per day, denies alcohol, listed drug use.  She uses walker for ambulation.  ED Course: Upon arrival: Patient's heart rate noted to be in 50s/60s, blood pressure is stable, afebrile with no leukocytosis, H&H shows microcytic anemia, CMP shows worsening kidney function with creatinine of 1.5, therefore of 32, BNP: 162, COVID-19 negative, troponin x2 -, magnesium, TSH, T4, T3: Pending.  Chest x-ray came back negative for acute findings.  EDP consulted cardiology.  [facility] hospitalist consulted for admission due to near syncope.  Review of Systems: As per HPI otherwise negative.     Past Medical History:  Diagnosis Date  . Anemia 09/2016  . Cirrhosis ([place]) 08/2015   Fatty liver seen on imaging of 2014.  Lifetime teetotaler.  . DDD (degenerative disc disease)    With chronic pain  . Diabetes mellitus without complication ([place])   . Diastolic dysfunction 78/9381  . Gallstone 2014   Incidental finding, no complications.  . Hyperlipidemia   . Hypertension   . Hypokalemia 10/06/2013   Secondary to lasix  . Hypoxia 10/07/2013   From mild resp depression from opioids  . Mitral valve prolapse 09/2013   Mild per echo  . Opioid intoxication delirium ([place]) 2015   Unintentional overdose with acute psychoses  . Osteoporosis   . Prolonged Q-T interval on ECG 10/07/2013   Secondary to hypokalemia  . Pulmonary embolism ([place]) 2016   Started on Xarelto  . Thyroid disease    Initially hyperthyroid, question Graves' disease, underwent thyroidectomy and subsequently hypothyroid  . Tobacco abuse 10/07/2013    Past Surgical History:  Procedure Laterality Date  . COLONOSCOPY N/A 09/26/2016   Procedure: COLONOSCOPY;  Surgeon: Mauri Pole, MD;  Location: [facility] ENDOSCOPY;  Service: Endoscopy;  Laterality: N/A;  . COLONOSCOPY N/A 09/27/2016   Procedure: COLONOSCOPY;  Surgeon: Mauri Pole, MD;  Location: [facility] ENDOSCOPY;  Service: Endoscopy;  Laterality: N/A;  . ESOPHAGOGASTRODUODENOSCOPY N/A 09/26/2016   Procedure: ESOPHAGOGASTRODUODENOSCOPY (EGD);  Surgeon: Mauri Pole, MD;  Location: [facility] ENDOSCOPY;  Service: Endoscopy;  Laterality: N/A;  . THYROID SURGERY       reports that she has been smoking cigarettes. She has a 74.00 pack-year  smoking history. She has never used smokeless tobacco. She reports that she does not drink alcohol and does not use drugs.  Allergies  Allergen Reactions  . Aspirin Other (See Comments)    Burns line in stomach    Family History  Problem Relation Age of Onset  . Schizophrenia Maternal Grandfather     Prior to Admission  medications   Medication Sig Start Date End Date Taking? Authorizing Provider  albuterol (PROVENTIL HFA;VENTOLIN HFA) 108 (90 Base) MCG/ACT inhaler Take 2 puffs 3 times daily for 5 more days and then every 6 hours as needed thereafter. Patient taking differently: Inhale 2 puffs into the lungs every 6 (six) hours as needed for wheezing or shortness of breath.  12/27/15  Yes Rexene Alberts, MD  albuterol (PROVENTIL) (2.5 MG/3ML) 0.083% nebulizer solution Take 2.5 mg by nebulization every 4 (four) hours as needed for wheezing or shortness of breath.  08/19/16  Yes [provider]  atorvastatin (LIPITOR) 40 MG tablet Take 40 mg by mouth daily.   Yes [provider]  clonazePAM (KLONOPIN) 0.5 MG tablet Take 0.5 mg by mouth daily.   Yes [provider]  diclofenac sodium (VOLTAREN) 1 % GEL Apply 1 application topically 3 (three) times daily as needed (pain).  08/16/16  Yes [provider]  DULoxetine (CYMBALTA) 60 MG capsule Take 60 mg by mouth daily before lunch.  06/08/15  Yes [provider]  furosemide (LASIX) 40 MG tablet Take 40 mg by mouth daily as needed for fluid.    Yes [provider]  gabapentin (NEURONTIN) 600 MG tablet Take 600 mg by mouth 3 (three) times daily as needed (pain).  08/14/20  Yes [provider]  glimepiride (AMARYL) 1 MG tablet Take 1 mg by mouth daily with breakfast.   Yes [provider]  levothyroxine (SYNTHROID) 75 MCG tablet Take 75 mcg by mouth daily before breakfast.  09/28/13  Yes [provider]  losartan (COZAAR) 50 MG tablet Take 50 mg by mouth daily. 08/07/16  Yes [provider]  meloxicam (MOBIC) 7.5 MG tablet Take 7.5 mg by mouth daily.   Yes [provider]  metFORMIN (GLUCOPHAGE) 500 MG tablet Take 500 mg by mouth 2 (two) times daily. 09/13/16  Yes [provider]  metoprolol tartrate (LOPRESSOR) 25 MG tablet Take 25 mg by mouth 2 (two) times daily.  08/24/13  Yes  [provider]  potassium chloride (K-DUR) 10 MEQ tablet Take 10 mEq by mouth 2 (two) times daily.    Yes [provider]  traMADol (ULTRAM) 50 MG tablet Take 50 mg by mouth every 6 (six) hours as needed for moderate pain (pain).  09/09/16  Yes [provider]  Vitamin D, Ergocalciferol, (DRISDOL) 50000 UNITS CAPS capsule Take 50,000 Units by mouth every Monday.  07/17/15  Yes [provider]  omeprazole (PRILOSEC) 40 MG capsule Take 40 mg by mouth daily as needed (heartburn/ acid reflux).  09/28/13   [provider]  Tiotropium Bromide-Olodaterol (STIOLTO RESPIMAT) 2.5-2.5 MCG/ACT AERS Inhale 1 puff into the lungs daily as needed (shortness of breath). Patient not taking: Reported on 08/31/2020    [provider]    Physical Exam: Vitals:   08/31/20 1115 08/31/20 1145 08/31/20 1200 08/31/20 1215  BP: 140/61 (!) 146/56 (!) 150/54 (!) 136/52  Pulse: 60 60 61 62  Resp: 17 (!) 21 20 20   Temp:      TempSrc:      SpO2: 94% 98% 99%  98%  Weight:      Height:        Constitutional: NAD, calm, comfortable, on room air, communicating well, morbidly obese Eyes: PERRL, lids and conjunctivae normal ENMT: Mucous membranes are moist. Posterior pharynx clear of any exudate or lesions.Normal dentition.  Neck: normal, supple, no masses, no thyromegaly Respiratory: Bilateral coarse breath sound noted on exam. Cardiovascular: Regular rate and rhythm, no murmurs / rubs / gallops. No extremity edema. 2+ pedal pulses. No carotid bruits.  Abdomen: no tenderness, no masses palpated. No hepatosplenomegaly. Bowel sounds positive.  Musculoskeletal: no clubbing / cyanosis. No joint deformity upper and lower extremities. Good ROM, no contractures. Normal muscle tone.  Skin: no rashes, lesions, ulcers. No induration Neurologic: CN 2-12 grossly intact. Sensation intact, DTR normal. Strength 5/5 in all 4.  Psychiatric: Normal judgment and insight. Alert and oriented x  3. Normal mood.    Labs on Admission: I have personally reviewed following labs and imaging studies  CBC: Recent Labs  Lab 08/31/20 0502  WBC 6.1  NEUTROABS 3.9  HGB 8.4*  HCT 29.9*  MCV 79.7*  PLT 778   Basic Metabolic Panel: Recent Labs  Lab 08/31/20 0502  NA 138  K 4.6  CL 103  CO2 21*  GLUCOSE 241*  BUN 9  CREATININE 1.55*  CALCIUM 8.8*   GFR: Estimated Creatinine Clearance: 34.9 mL/min (A) (by C-G formula based on SCr of 1.55 mg/dL (H)). Liver Function Tests: Recent Labs  Lab 08/31/20 0502  AST 27  ALT 19  ALKPHOS 99  BILITOT 0.5  PROT 6.7  ALBUMIN 3.1*   No results for input(s): LIPASE, AMYLASE in the last 168 hours. No results for input(s): AMMONIA in the last 168 hours. Coagulation Profile: No results for input(s): INR, PROTIME in the last 168 hours. Cardiac Enzymes: No results for input(s): CKTOTAL, CKMB, CKMBINDEX, TROPONINI in the last 168 hours. BNP (last 3 results) No results for input(s): PROBNP in the last 8760 hours. HbA1C: No results for input(s): HGBA1C in the last 72 hours. CBG: No results for input(s): GLUCAP in the last 168 hours. Lipid Profile: No results for input(s): CHOL, HDL, LDLCALC, TRIG, CHOLHDL, LDLDIRECT in the last 72 hours. Thyroid Function Tests: No results for input(s): TSH, T4TOTAL, FREET4, T3FREE, THYROIDAB in the last 72 hours. Anemia Panel: No results for input(s): VITAMINB12, FOLATE, FERRITIN, TIBC, IRON, RETICCTPCT in the last 72 hours. Urine analysis:    Component Value Date/Time   COLORURINE YELLOW 08/27/2019 1514   APPEARANCEUR HAZY (A) 08/27/2019 1514   LABSPEC 1.005 08/27/2019 1514   PHURINE 6.0 08/27/2019 1514   GLUCOSEU NEGATIVE 08/27/2019 1514   HGBUR NEGATIVE 08/27/2019 1514   BILIRUBINUR NEGATIVE 08/27/2019 1514   KETONESUR NEGATIVE 08/27/2019 1514   PROTEINUR NEGATIVE 08/27/2019 1514   UROBILINOGEN 1.0 09/05/2015 1440   NITRITE NEGATIVE 08/27/2019 1514   LEUKOCYTESUR TRACE (A) 08/27/2019 1514     Radiological Exams on Admission: DG Chest 2 View  Result Date: 08/31/2020 CLINICAL DATA:  Shortness of breath EXAM: CHEST - 2 VIEW COMPARISON:  Multiple priors, most recent on 08/27/2019 FINDINGS: No borderline enlargement of the cardiac silhouette, similar to prior. Tortuous aorta. Central peribronchial thickening is similar over priors. Linear left basilar opacities, likely atelectasis/scar. No consolidation. No visible pleural effusions. No pneumothorax. IMPRESSION: 1. No evidence of acute cardiopulmonary abnormality. 2. Linear left basilar opacities, similar to priors and likely atelectasis/scar. 3. Similar chronic findings. Electronically Signed   By: Margaretha Sheffield MD   On: 08/31/2020 11:45  EKG: Independently reviewed.  Junctional rhythm, nonspecific IV conduction delay.  No ST elevation or depression noted.  Assessment/Plan Principal Problem:   Near syncope Active Problems:   Diabetes mellitus without complication (HCC)   Acute on chronic kidney failure (HCC)   Hypothyroidism   HTN (hypertension)   GERD (gastroesophageal reflux disease)   Microcytic anemia   Bradycardia   Near syncope/bradycardia: -Likely secondary to orthostatic hypotension/beta-blocker vs heart block. -Patient presented with near syncope, bradycardia and hypotension.  She placed in route to ED by EMS.  Currently her vital signs stable.  She is afebrile with no leukocytosis and maintaining oxygen saturation on room air.  Reviewed chest x-ray.  Troponin x2 -, COVID-19: Negative. -Admit patient on the floor.  On telemetry. -Magnesium, thyroid panel: Pending.  Reviewed EKG. -Transthoracic echo is pending. -EDP consulted cardiology-await recommendations. -Continue with gentle hydration.  Hold metoprolol and other antihypertensive medication for now. -Consult PT/OT.  On fall precautions  Acute on chronic CKD stage IIIb: -Creatinine: 1.5, GFR: 32 (baseline creatinine: 1.18, GFR: 45) -Continue with gentle  IV hydration.  Hold nephrotoxic medication. -Repeat BMP tomorrow a.m.  Hypertension: Low upon arrival -Hold metoprolol and losartan.  Monitor blood pressure closely.  Type 2 diabetes mellitus: Check A1c -Hold Metformin and Amaryl.  Started patient on sliding scale insulin.  Monitor blood sugar closely.  Microcytic anemia: H&H 8.4 was 10.4 last year.  No active bleeding. -Check iron studies.  Monitor H&H closely and transfuse as needed.  Hypothyroidism: Check TSH.  Continue levothyroxine  Pression/anxiety: Continue Klonopin, Cymbalta  Chronic diastolic CHF: Patient appears euvolemic on exam. -Hold Lasix for now due to AKI and low blood pressure upon arrival.  Strict INO's and daily weight.  Monitor signs for fluid overload.  COPD: Maintaining oxygen saturation on room air.  Chest x-ray is negative.  COVID-19: Negative.  Continue home inhalers.  Tobacco abuse: Counseled about cessation.  Hyperlipidemia: Continue statin  GERD: Continue PPI  Morbid obesity with BMI of 33: -Diet modification/exercise and weight loss recommended.  DVT prophylaxis: Lovenox/SCD Code Status: Full code-confirmed with the patient Family Communication: None present at bedside.  Plan of care discussed with patient in length and she verbalized understanding and agreed with it. Disposition Plan: Home in 1 to 2 days  consults called: Cardiology by EDP Admission status: Inpatient   Mckinley Jewel MD [facility]  If 7PM-7AM, please contact night-coverage [email or web address] Password [email or web address]  08/31/2020, 1:24 PM

## 2020-08-31 NOTE — ED Provider Notes (Signed)
[facility] EMERGENCY DEPARTMENT Provider Note   CSN: [id number] Arrival date & time: 08/31/20  0458     History Chief Complaint  Patient presents with   Heart Problem    Rhonda Santos is a 74 y.o. female.  Patient is a 74 year old female with past medical history of cirrhosis, chronic renal insufficiency, hypertension, hyperlipidemia, obesity, pulmonary embolism, diabetes.  She presents today for evaluation of weakness and near syncope.  Patient woke up from sleep to go to the bathroom.  While she was ambulating, she felt as though she was going to pass out and felt extremely weak.  EMS was called and patient was found to have severe bradycardia with a heart rate in the low 20s.  This was presumed to be complete heart block.  Patient was placed on the external pacer and did have some pacing prior to arrival to the ER.  She now feels better and appears to be back in sinus rhythm.  She denies to me she is experiencing any chest pain, but does feel weak and short of breath.  She denies any history of cardiac disease.  The history is provided by the patient.       Past Medical History:  Diagnosis Date   Anemia 09/2016   Cirrhosis ([place]) 08/2015   Fatty liver seen on imaging of 2014.  Lifetime teetotaler.   DDD (degenerative disc disease)    With chronic pain   Diabetes mellitus without complication ([place])    Diastolic dysfunction 11/8785   Gallstone 2014   Incidental finding, no complications.   Hyperlipidemia    Hypertension    Hypokalemia 10/06/2013   Secondary to lasix   Hypoxia 10/07/2013   From mild resp depression from opioids   Mitral valve prolapse 09/2013   Mild per echo   Opioid intoxication delirium ([place]) 2015   Unintentional overdose with acute psychoses   Osteoporosis    Prolonged Q-T interval on ECG 10/07/2013   Secondary to hypokalemia   Pulmonary embolism ([place]) 2016   Started on Xarelto   Thyroid disease    Initially  hyperthyroid, question Graves' disease, underwent thyroidectomy and subsequently hypothyroid   Tobacco abuse 10/07/2013    Patient Active Problem List   Diagnosis Date Noted   Iron deficiency anemia    Symptomatic anemia 09/24/2016   Abdominal pain    COPD exacerbation ([place]) 12/25/2015   Acute respiratory failure with hypoxia ([place]) 12/25/2015   History of pulmonary embolus ([place]) 12/25/2015   Chronic anticoagulation 12/25/2015   Hypothyroidism 12/25/2015   HTN (hypertension) 12/25/2015   GERD (gastroesophageal reflux disease) 12/25/2015   Acute abdominal pain    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   Sepsis with hypotension ([place]) 09/05/2015   Elevated lactic acid level 09/05/2015   Abdominal pain, acute 09/05/2015   Acute renal failure superimposed on stage 3 chronic kidney disease ([place]) 09/05/2015   History of pulmonary embolism/ July 23, 2015 09/05/2015   Anemia 09/05/2015   Blood poisoning    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   Diabetes mellitus type 2, uncontrolled, without complications 76/72/0947   Prolonged Q-T interval on ECG 09/62/8366   Metabolic alkalosis 29/47/6546   Tobacco abuse 10/07/2013   Transaminitis 10/07/2013   Left ventricular diastolic dysfunction, NYHA class 2 10/07/2013   Hypokalemia 10/06/2013   Weakness 10/06/2013   Altered mental status 10/06/2013    Past Surgical History:  Procedure Laterality Date   COLONOSCOPY N/A 09/26/2016   Procedure: COLONOSCOPY;  Surgeon: Mauri Pole, MD;  Location: [facility] ENDOSCOPY;  Service: Endoscopy;  Laterality: N/A;   COLONOSCOPY N/A 09/27/2016   Procedure: COLONOSCOPY;  Surgeon: Mauri Pole, MD;  Location: [facility] ENDOSCOPY;  Service: Endoscopy;  Laterality: N/A;   ESOPHAGOGASTRODUODENOSCOPY N/A 09/26/2016   Procedure: ESOPHAGOGASTRODUODENOSCOPY (EGD);  Surgeon: Mauri Pole, MD;  Location[facility] ENDOSCOPY;  Service: Endoscopy;   Laterality: N/A;   THYROID SURGERY       OB History   No obstetric history on file.     Family History  Problem Relation Age of Onset   Schizophrenia Maternal Grandfather     Social History   Tobacco Use   Smoking status: Current Every Day Smoker    Packs/day: 1.00    Years: 37.00    Pack years: 37.00    Types: Cigarettes   Smokeless tobacco: Never Used  Substance Use Topics   Alcohol use: No   Drug use: No    Home Medications Prior to Admission medications   Medication Sig Start Date End Date Taking? Authorizing Provider  albuterol (PROVENTIL HFA;VENTOLIN HFA) 108 (90 Base) MCG/ACT inhaler Take 2 puffs 3 times daily for 5 more days and then every 6 hours as needed thereafter. Patient taking differently: Inhale 2 puffs into the lungs every 6 (six) hours as needed for wheezing or shortness of breath.  12/27/15   Rexene Alberts, MD  albuterol (PROVENTIL) (2.5 MG/3ML) 0.083% nebulizer solution Take 2.5 mg by nebulization 3 (three) times daily. 08/19/16   [provider]  atorvastatin (LIPITOR) 40 MG tablet Take 40 mg by mouth daily.    [provider]  clonazePAM (KLONOPIN) 0.5 MG tablet Take 0.5 mg by mouth daily.    [provider]  diclofenac sodium (VOLTAREN) 1 % GEL Apply 1 application topically 3 (three) times daily as needed (pain).  08/16/16   [provider]  DULoxetine (CYMBALTA) 60 MG capsule Take 60 mg by mouth daily before lunch.  06/08/15   [provider]  ferrous sulfate 325 (65 FE) MG tablet Take 1 tablet (325 mg total) by mouth 3 (three) times daily with meals. 09/27/16   Ghimire, Henreitta Leber, MD  furosemide (LASIX) 40 MG tablet Take 40 mg by mouth daily.     [provider]  gabapentin (NEURONTIN) 400 MG capsule Take 800 mg by mouth every 6 (six) hours as needed (pain).  09/09/16   [provider]  levothyroxine (SYNTHROID, LEVOTHROID) 175 MCG tablet Take 175 mcg by mouth daily before breakfast.   09/28/13   [provider]  losartan (COZAAR) 50 MG tablet Take 50 mg by mouth daily. 08/07/16   [provider]  metFORMIN (GLUCOPHAGE) 500 MG tablet Take 500 mg by mouth 2 (two) times daily. 09/13/16   [provider]  metoprolol tartrate (LOPRESSOR) 25 MG tablet Take 25 mg by mouth 2 (two) times daily.  08/24/13   [provider]  omeprazole (PRILOSEC) 40 MG capsule Take 40 mg by mouth daily as needed (heartburn/ acid reflux).  09/28/13   [provider]  potassium chloride (K-DUR) 10 MEQ tablet Take 10 mEq by mouth 2 (two) times daily.     [provider]  Tiotropium Bromide-Olodaterol (STIOLTO RESPIMAT) 2.5-2.5 MCG/ACT AERS Inhale 1 puff into the lungs daily as needed (shortness of breath).    [provider]  traMADol (ULTRAM) 50 MG tablet Take 100 mg by mouth every 6 (six) hours as needed (pain).  09/09/16   [provider]  Vitamin D, Ergocalciferol, (DRISDOL) 50000 UNITS CAPS capsule Take 50,000 Units by mouth every Monday.  07/17/15   [provider]    Allergies    Aspirin  Review of Systems   Review of Systems  All other systems reviewed and are negative.   Physical Exam Updated Vital Signs There were no vitals taken for this visit.  Physical Exam Vitals and nursing note reviewed.  Constitutional:      General: She is not in acute distress.    Appearance: She is well-developed. She is obese. She is not diaphoretic.  HENT:     Head: Normocephalic and atraumatic.  Cardiovascular:     Rate and Rhythm: Normal rate and regular rhythm.     Heart sounds: No murmur heard.  No friction rub. No gallop.   Pulmonary:     Effort: Pulmonary effort is normal. No respiratory distress.     Breath sounds: Normal breath sounds. No wheezing.  Abdominal:     General: Bowel sounds are normal. There is no distension.     Palpations: Abdomen is soft.     Tenderness: There is no abdominal tenderness.    Musculoskeletal:        General: Normal range of motion.     Cervical back: Normal range of motion and neck supple.  Skin:    General: Skin is warm and dry.     Coloration: Skin is pale.  Neurological:     Mental Status: She is alert and oriented to person, place, and time.     ED Results / Procedures / Treatments   Labs (all labs ordered are listed, but only abnormal results are displayed) Labs Reviewed  COMPREHENSIVE METABOLIC PANEL  CBC WITH DIFFERENTIAL/PLATELET  BRAIN NATRIURETIC PEPTIDE  TROPONIN I (HIGH SENSITIVITY)    EKG EKG Interpretation  Date/Time:  Thursday August 31 2020 05:04:34 EDT Ventricular Rate:  66 PR Interval:    QRS Duration: 147 QT Interval:  539 QTC Calculation: 565 R Axis:   -36 Text Interpretation: Junctional rhythm Nonspecific IVCD with LAD Anterior infarct, old Confirmed by Veryl Speak [phone]) on 08/31/2020 6:12:04 AM   Radiology No results found.  Procedures Procedures (including critical care time)  Medications Ordered in ED Medications - No data to display  ED Course  I have reviewed the triage vital signs and the nursing notes.  Pertinent labs & imaging results that were available during my care of the patient were reviewed by me and considered in my medical decision making (see chart for details).    MDM Rules/Calculators/A&P  Patient brought here by EMS after a near syncopal episode.  Patient woke up to go to the bathroom when she began feeling strangely and nearly passed out.  She had to sit on the floor because she became so weak.  EMS rhythm strip revealed severe bradycardia and what appeared to be either a junctional escape rhythm or idioventricular rhythm.  She was temporarily paced, but arrives now in sinus rhythm.  Patient's work-up reveals essentially unremarkable laboratory studies.  Care has been discussed with cardiology who will consult and likely admit.  Final Clinical Impression(s) / ED Diagnoses Final  diagnoses:  None    Rx / DC Orders ED Discharge Orders    None       Veryl Speak, MD 09/01/20 0206

## 2020-08-31 NOTE — Consult Note (Addendum)
Cardiology Consultation:   Patient ID: Rhonda Santos; [id number]; 07-15-1946   Admit date: 08/31/2020 Date of Consult: 08/31/2020  Primary Care Provider: Vidal Schwalbe, MD Primary Cardiologist: New to [place] Primary Electrophysiologist:  None   Patient Profile:   Rhonda Santos is a 74 y.o. 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 Wilson Singer.  History of Present Illness:   Rhonda Santos was in her usual state of health until around midnight when she experienced sudden onset weaknss and near-syncope when ambulating to the bathroom. This was precipitated by arm pain/whole body pain which she has difficulty characterizing, though denies chest pain. EMS was activated given persistent symptoms and she was found to be bradycardic to the 20s with some mention of possible CHB. EMS run sheets have not been uploaded to the system, however rhythm strips reviewed and confirm bradycardia to the 20s with bigeminy pattern, cannot exclude CHB. It appears she was transcutaneously paced for an unknown period of time. Unclear whether she received any medications.    She does not follow with a cardiologist. She had an echocardiogram in 2016 to evaluate a murmur which showed EF 55-60%, no RWMA, trivial AI, mild LAE, and PA pressure elevated to 38 mmHg. No prior ischemic evaluation, though noted to have coronary artery calcifications and mild aortic atherosclerosis on CTA chest in 2016.   At the time of this evaluation she reports feeling back to normal. She reported arm pain and weakness improved once EMS started percutaneous pacing. She has never experienced anything like this before. She reports occasional dizzy spells maybe once per month which are fleeting and  not bothersome. She has chronic DOE due to her COPD which is unchanged. She denies any chest pain, palpitations, syncope, LE edema, orthopnea, or PND. No recent medication changes and she reports taking her metoprolol yesterday evening as prescribed.   On arrival to the ED, HR was in the 60s (trough 58 bpm). Labs notable for electrolytes wnl, Cr 1.55 (1.1 08/2019), Hgb 8.4 (down from 10.4 08/2019), PLT 305, BNP 162, HsTrop 9>8. COVID-19 negative. EKG with junctional rhythm, rate 66 bpm, LBBB (chronic). Cardiology asked to evaluate for bradycardia.   Past Medical History:  Diagnosis Date  . Anemia 09/2016  . Cirrhosis ([place]) 08/2015   Fatty liver seen on imaging of 2014.  Lifetime teetotaler.  . DDD (degenerative disc disease)    With chronic pain  . Diabetes mellitus without complication ([place])   . Diastolic dysfunction 52/7782  . Gallstone 2014   Incidental finding, no complications.  . Hyperlipidemia   . Hypertension   . Hypokalemia 10/06/2013   Secondary to lasix  . Hypoxia 10/07/2013   From mild resp depression from opioids  . Mitral valve prolapse 09/2013   Mild per echo  . Opioid intoxication delirium ([place]) 2015   Unintentional overdose with acute psychoses  . Osteoporosis   . Prolonged Q-T interval on ECG 10/07/2013   Secondary to hypokalemia  . Pulmonary embolism ([place]) 2016   Started on Xarelto  . Thyroid disease    Initially hyperthyroid, question Graves' disease, underwent thyroidectomy and subsequently hypothyroid  . Tobacco abuse 10/07/2013    Past Surgical History:  Procedure Laterality Date  . COLONOSCOPY N/A 09/26/2016   Procedure: COLONOSCOPY;  Surgeon: Mauri Pole, MD;  Location: [facility] ENDOSCOPY;  Service: Endoscopy;  Laterality: N/A;  . COLONOSCOPY N/A 09/27/2016   Procedure: COLONOSCOPY;  Surgeon: Mauri Pole, MD;  Location: [facility] ENDOSCOPY;  Service: Endoscopy;  Laterality: N/A;  . ESOPHAGOGASTRODUODENOSCOPY N/A 09/26/2016   Procedure:  ESOPHAGOGASTRODUODENOSCOPY (EGD);  Surgeon: Mauri Pole, MD;  Location: [facility] ENDOSCOPY;  Service: Endoscopy;  Laterality: N/A;  . THYROID SURGERY       Home Medications:  Prior to Admission medications   Medication Sig Start Date End Date Taking? Authorizing Provider  albuterol (PROVENTIL HFA;VENTOLIN HFA) 108 (90 Base) MCG/ACT inhaler Take 2 puffs 3 times daily for 5 more days and then every 6 hours as needed thereafter. Patient taking differently: Inhale 2 puffs into the lungs every 6 (six) hours as needed for wheezing or shortness of breath.  12/27/15  Yes Rexene Alberts, MD  albuterol (PROVENTIL) (2.5 MG/3ML) 0.083% nebulizer solution Take 2.5 mg by nebulization every 4 (four) hours as needed for wheezing or shortness of breath.  08/19/16  Yes [provider]  atorvastatin (LIPITOR) 40 MG tablet Take 40 mg by mouth daily.   Yes [provider]  clonazePAM (KLONOPIN) 0.5 MG tablet Take 0.5 mg by mouth daily.   Yes [provider]  diclofenac sodium (VOLTAREN) 1 % GEL Apply 1 application topically 3 (three) times daily as needed (pain).  08/16/16  Yes [provider]  DULoxetine (CYMBALTA) 60 MG capsule Take 60 mg by mouth daily before lunch.  06/08/15  Yes [provider]  furosemide (LASIX) 40 MG tablet Take 40 mg by mouth daily as needed for fluid.    Yes [provider]  gabapentin (NEURONTIN) 600 MG tablet Take 600 mg by mouth 3 (three) times daily as needed (pain).  08/14/20  Yes [provider]  glimepiride (AMARYL) 1 MG tablet Take 1 mg by mouth daily with breakfast.   Yes [provider]  levothyroxine (SYNTHROID) 75 MCG tablet Take 75 mcg by mouth daily before breakfast.  09/28/13  Yes [provider]  losartan (COZAAR) 50 MG tablet Take 50 mg by mouth daily. 08/07/16  Yes [provider]  meloxicam (MOBIC) 7.5 MG tablet Take 7.5 mg by mouth daily.   Yes [provider]  metFORMIN  (GLUCOPHAGE) 500 MG tablet Take 500 mg by mouth 2 (two) times daily. 09/13/16  Yes [provider]  metoprolol tartrate (LOPRESSOR) 25 MG tablet Take 25 mg by mouth 2 (two) times daily.  08/24/13  Yes [provider]  potassium chloride (K-DUR) 10 MEQ tablet Take 10 mEq by mouth 2 (two) times daily.    Yes [provider]  traMADol (ULTRAM) 50 MG tablet Take 50 mg by mouth every 6 (six) hours as needed for moderate pain (pain).  09/09/16  Yes [provider]  Vitamin D, Ergocalciferol, (DRISDOL) 50000 UNITS CAPS capsule Take 50,000 Units by mouth every Monday.  07/17/15  Yes [provider]  omeprazole (PRILOSEC) 40 MG capsule Take 40 mg by mouth daily as needed (heartburn/ acid reflux).  09/28/13   [provider]  Tiotropium Bromide-Olodaterol (STIOLTO RESPIMAT) 2.5-2.5 MCG/ACT AERS Inhale 1 puff into the lungs daily as needed (shortness of breath). Patient not taking: Reported on 08/31/2020    [provider]    Inpatient Medications: Scheduled Meds:  Continuous Infusions:  PRN Meds:   Allergies:    Allergies  Allergen Reactions  . Aspirin Other (See Comments)    Burns line in  stomach    Social History:   Social History   Socioeconomic History  . Marital status: Widowed    Spouse name: Not on file  . Number of children: Not on file  . Years of education: Not on file  . Highest education level: Not on file  Occupational History  . Not on file  Tobacco Use  . Smoking status: Current Every Day Smoker    Packs/day: 2.00    Years: 37.00    Pack years: 74.00    Types: Cigarettes  . Smokeless tobacco: Never Used  Vaping Use  . Vaping Use: Never used  Substance and Sexual Activity  . Alcohol use: No  . Drug use: No  . Sexual activity: Never  Other Topics Concern  . Not on file  Social History Narrative  . Not on file   Social Determinants of Health   Financial Resource Strain:   . Difficulty of Paying Living  Expenses: Not on file  Food Insecurity:   . Worried About Charity fundraiser in the Last Year: Not on file  . Ran Out of Food in the Last Year: Not on file  Transportation Needs:   . Lack of Transportation (Medical): Not on file  . Lack of Transportation (Non-Medical): Not on file  Physical Activity:   . Days of Exercise per Week: Not on file  . Minutes of Exercise per Session: Not on file  Stress:   . Feeling of Stress : Not on file  Social Connections:   . Frequency of Communication with Friends and Family: Not on file  . Frequency of Social Gatherings with Friends and Family: Not on file  . Attends Religious Services: Not on file  . Active Member of Clubs or Organizations: Not on file  . Attends Archivist Meetings: Not on file  . Marital Status: Not on file  Intimate Partner Violence:   . Fear of Current or Ex-Partner: Not on file  . Emotionally Abused: Not on file  . Physically Abused: Not on file  . Sexually Abused: Not on file    Family History:    Family History  Problem Relation Age of Onset  . Schizophrenia Maternal Grandfather      ROS:  Please see the history of present illness.  ROS  All other ROS reviewed and negative.     Physical Exam/Data:   Vitals:   08/31/20 0830 08/31/20 0945 08/31/20 1000 08/31/20 1015  BP: 129/60 (!) 138/56 (!) 141/61 140/62  Pulse: (!) 57 (!) 58 60 61  Resp: 18 20 18 19   Temp:      TempSrc:      SpO2: 96% 96% 96% 97%  Weight:      Height:       No intake or output data in the 24 hours ending 08/31/20 1038 Filed Weights   08/31/20 0504  Weight: 90.7 kg   Body mass index is 33.28 kg/m.  General:  Obese elderly female sitting upright in bed in NAD HEENT: sclera anicteric  Neck: no JVD Vascular: No carotid bruits; distal pulses 2+ bilaterally Cardiac:  normal S1, S2; RRR; no murmurs, rubs, or gallops appreciated; quite heart sounds Lungs: scattered wheezing and rhonchi without obvious rales Abd: NABS, soft,  obese, nontender, no hepatomegaly Ext: no edema Musculoskeletal:  No deformities, BUE and BLE strength normal and equal Skin: warm and dry  Neuro:  CNs 2-12 intact, no focal abnormalities noted Psych:  Normal affect   EKG:  The  EKG was personally reviewed and demonstrates: junctional rhythm, rate 66 bpm, LBBB (chronic).  Telemetry:  Telemetry was personally reviewed and demonstrates:  Sinus rhythm with HR in the 60s  Relevant CV Studies: Echocardiogram 2016: - Left ventricle: The cavity size was normal. Wall thickness was  increased in a pattern of mild LVH. Systolic function was normal.  The estimated ejection fraction was in the range of 55% to 60%.  Wall motion was normal; there were no regional wall motion  abnormalities. Doppler parameters are consistent with both  elevated ventricular end-diastolic filling pressure and elevated  left atrial filling pressure.  - Aortic valve: There was trivial regurgitation. Valve area (VTI):  1.46 cm^2. Valve area (Vmax): 1.39 cm^2. Valve area (Vmean): 1.46  cm^2.  - Left atrium: The atrium was mildly dilated.  - Atrial septum: No defect or patent foramen ovale was identified.  - Pulmonary arteries: PA peak pressure: 38 mm Hg (S).  - Pericardium, extracardiac: A trivial pericardial effusion was  identified.   Laboratory Data:  Chemistry Recent Labs  Lab 08/31/20 0502  NA 138  K 4.6  CL 103  CO2 21*  GLUCOSE 241*  BUN 9  CREATININE 1.55*  CALCIUM 8.8*  GFRNONAA 32*  GFRAA 38*  ANIONGAP 14    Recent Labs  Lab 08/31/20 0502  PROT 6.7  ALBUMIN 3.1*  AST 27  ALT 19  ALKPHOS 99  BILITOT 0.5   Hematology Recent Labs  Lab 08/31/20 0502  WBC 6.1  RBC 3.75*  HGB 8.4*  HCT 29.9*  MCV 79.7*  MCH 22.4*  MCHC 28.1*  RDW 19.6*  PLT 305   Cardiac EnzymesNo results for input(s): TROPONINI in the last 168 hours. No results for input(s): TROPIPOC in the last 168 hours.  BNP Recent Labs  Lab 08/31/20 0502    BNP 162.8*    DDimer No results for input(s): DDIMER in the last 168 hours.  Radiology/Studies:  No results found.  Assessment and Plan:   1. Bradycardia/pre-syncope: patient presented with sudden onset weakness and pre-syncope when ambulating to the bathroom shortly after waking this morning. EMS was activated and she was noted to have a HR in the 20s with ?CHB. EMS run sheet is not uploaded, however rhythm strips reviewed and confirm bradycardia to the 20s with bigeminy pattern, cannot exclude CHB. Here HR has been in the 60s (trough 57 bpm) with junctional rhythm on EKG. She is on metoprolol tartrate 25mg  BID at home. Possible this is 2/2 a vasovagal event, though degree of bradycardia exceeds expectations. She was hypotensive with SBP in the 80s at that time. She initially denied to me prior syncope, however describes a similar situation to a colleague of sudden onset weakness with syncope a few months ago shortly after standing.  - Will hold metoprolol and monitor for recurrent bradycardia - Continue to monitor on telemetry - Will update an echocardiogram - Will check a TSH/FT4 - EP aware and will likely see this admission pending further monitoring  2. Chronic diastolic CHF: SOB is at baseline 2/2 COPD. No volume overload complaints or recent use of prn lasix. CXR is without acute findings. BNP 162. She appears euvolemic on exam - Hold metoprolol as above - Continue prn lasix  3. HTN: BP a little elevated, likely 2/2 missing morning medications - Can continue home losartan  4. HLD: No LDL on file - Continue statin  5. COPD: reports chronic SOB and cough which are unchanged. She does have rhonchi and wheezing  on exam. CXR without acute findings.    - Continue management per primary team  6. Anemia: Hgb 8.4 today, down from 10.4 1 year ago. No complaints of bleeding. Benign colonoscopy in 2017.  - Would check an anemia panel and stool guaiac  7. CKD stage 3: Cr 1.55, up from 1.1  08/2019; unclear if this is AKI or progression of CKD. - Continue to monitor  8. DM type 2: no recent A1C - Will follow-up A1C - Continue management per primary team  9. Secondary hypothyroidism: ?Graves s/p thyroidectomy.  - Will check TSH/FT4/FT3 - Continue levothyroxine   10. Suspected OSA: patient snores and reports daytime somnolence. She is morbidly obese - Would benefit from a sleep study outpatient   For questions or updates, please contact [place] Please consult [email or web address] for contact info under Cardiology/STEMI.   Signed, Abigail Butts, PA-C  08/31/2020 10:38 AM [phone]  Patient seen and examined.  Agree with above documentation.  Ms. Gruenberg is a 74 year old female with a history of chronic diastolic heart failure, LBBB, COPD, cirrhosis due to NAFLD, PE, hypertension, type 2 diabetes mellitus, tobacco use who we are consulted by Dr. Wilson Singer for evaluation of bradycardia.  Patient reports that she was walking to the bathroom around midnight last night when she had sudden onset diffuse weakness and near syncopal episode.  She does state that she took her medications, including metoprolol yesterday evening.  EMS was called and on arrival she was bradycardic to the 20s.  Rhythm strips reviewed.  Ventricular bigeminy present with rates 20s to 30s.  Suspect complete heart block, though difficult to determine atrial activity on rhythm strips.  SBP 80s as recorded on rhythm strip.  She ultimately was started on transcutaneous pacing and taken to [facility] ED.  On arrival to the ED, heart rate was back to 60s.  EKG on arrival shows sinus bradycardia, rate 66, poor R wave progression.  On exam, patient is alert and oriented, regular rate and rhythm, no murmurs, lungs with expiratory wheezing,no LE edema or JVD.  For her bradycardia, will hold metoprolol and continue to monitor on telemetry.  Will discuss with EP about PPM.  Will check echocardiogram.  Donato Heinz, MD

## 2020-08-31 NOTE — Consult Note (Addendum)
ELECTROPHYSIOLOGY CONSULT NOTE    Patient ID: MCKENLEIGH TARLTON MRN: [id number], DOB/AGE: 76-Jul-1947 76 y.o.  Admit date: 08/31/2020 Date of Consult: 08/31/2020  Primary Physician: Vidal Schwalbe, MD Primary Cardiologist: No primary care provider on file.  Electrophysiologist: New  Referring Provider: Dr. Gardiner Rhyme  Patient Profile: Rhonda Santos is a 74 y.o. female with a history of chronic diastolic CHF, LBBB, HTN, HLD, DM2, COPD, thyroid disease, PE, cirrhosis, 2/2 Non-alcoholic fatty liver disease, CKD III, anxiety/depression, obesity, and tobacco abuse who is being seen today for the evaluation of bradycarda at the request of Dr. Gardiner Rhyme.  HPI:  Rhonda Santos is a 74 y.o. female with medical history above.   She reports she was her USOH until yesterday evening around midnight. She was getting ready for bed and stood up to go urinate. She felt sudden onset "whole body pain", weakness, and near-syncope and called 911.  EMS strips show HRs in the 20-30s with bradycardia and bigeminy with occasional transcutaneous pacing. Unclear if she received any ACLS medications. P waves difficult to appreciate but suspect intermittent CHB.  Distant echo (2016) with EF 55-60% to evaluate for murmur. Repeat pending today.   She is feeling back to her baseline currently. Her weakness and "arm pain" resolved with pacing and with NSR. She reports occasional lightheadedness with rapid standing. She has had syncope on one occasion with standing from her bed in the middle of the night. She states this occurred about 4 months ago. She denies any chest pain, palpitations, LE edema, orthopnea, or PND. No recent medication changes or illness.    Past Medical History:  Diagnosis Date  . Anemia 09/2016  . Cirrhosis ([place]) 08/2015   Fatty liver seen on imaging of 2014.  Lifetime teetotaler.  . DDD (degenerative disc disease)    With chronic pain  . Diabetes mellitus without complication ([place])   . Diastolic  dysfunction 12/6008  . Gallstone 2014   Incidental finding, no complications.  . Hyperlipidemia   . Hypertension   . Hypokalemia 10/06/2013   Secondary to lasix  . Hypoxia 10/07/2013   From mild resp depression from opioids  . Mitral valve prolapse 09/2013   Mild per echo  . Opioid intoxication delirium ([place]) 2015   Unintentional overdose with acute psychoses  . Osteoporosis   . Prolonged Q-T interval on ECG 10/07/2013   Secondary to hypokalemia  . Pulmonary embolism ([place]) 2016   Started on Xarelto  . Thyroid disease    Initially hyperthyroid, question Graves' disease, underwent thyroidectomy and subsequently hypothyroid  . Tobacco abuse 10/07/2013     Surgical History:  Past Surgical History:  Procedure Laterality Date  . COLONOSCOPY N/A 09/26/2016   Procedure: COLONOSCOPY;  Surgeon: Mauri Pole, MD;  Location: [facility];  Service: Endoscopy;  Laterality: N/A;  . COLONOSCOPY N/A 09/27/2016   Procedure: COLONOSCOPY;  Surgeon: Mauri Pole, MD;  Location: [facility];  Service: Endoscopy;  Laterality: N/A;  . ESOPHAGOGASTRODUODENOSCOPY N/A 09/26/2016   Procedure: ESOPHAGOGASTRODUODENOSCOPY (EGD);  Surgeon: Mauri Pole, MD;  Location: [facility] ENDOSCOPY;  Service: Endoscopy;  Laterality: N/A;  . THYROID SURGERY       Medications Prior to Admission  Medication Sig Dispense Refill Last Dose  . albuterol (PROVENTIL HFA;VENTOLIN HFA) 108 (90 Base) MCG/ACT inhaler Take 2 puffs 3 times daily for 5 more days and then every 6 hours as needed thereafter. (Patient taking differently: Inhale 2 puffs into the lungs every 6 (six) hours as needed for wheezing  or shortness of breath. ) 1 Inhaler 2 Past Week at Unknown time  . albuterol (PROVENTIL) (2.5 MG/3ML) 0.083% nebulizer solution Take 2.5 mg by nebulization every 4 (four) hours as needed for wheezing or shortness of breath.    08/30/2020 at Unknown time  . atorvastatin (LIPITOR) 40 MG tablet Take 40 mg by mouth daily.    08/30/2020 at Unknown time  . clonazePAM (KLONOPIN) 0.5 MG tablet Take 0.5 mg by mouth daily.   08/30/2020 at Unknown time  . diclofenac sodium (VOLTAREN) 1 % GEL Apply 1 application topically 3 (three) times daily as needed (pain).    unkn  . DULoxetine (CYMBALTA) 60 MG capsule Take 60 mg by mouth daily before lunch.    08/30/2020 at Unknown time  . furosemide (LASIX) 40 MG tablet Take 40 mg by mouth daily as needed for fluid.    unkn  . gabapentin (NEURONTIN) 600 MG tablet Take 600 mg by mouth 3 (three) times daily as needed (pain).    08/30/2020 at Unknown time  . glimepiride (AMARYL) 1 MG tablet Take 1 mg by mouth daily with breakfast.   08/30/2020 at Unknown time  . levothyroxine (SYNTHROID) 75 MCG tablet Take 75 mcg by mouth daily before breakfast.    08/30/2020 at Unknown time  . losartan (COZAAR) 50 MG tablet Take 50 mg by mouth daily.   08/30/2020 at Unknown time  . meloxicam (MOBIC) 7.5 MG tablet Take 7.5 mg by mouth daily.   08/30/2020 at Unknown time  . metFORMIN (GLUCOPHAGE) 500 MG tablet Take 500 mg by mouth 2 (two) times daily.   08/30/2020 at Unknown time  . metoprolol tartrate (LOPRESSOR) 25 MG tablet Take 25 mg by mouth 2 (two) times daily.    08/30/2020 at 2030  . potassium chloride (K-DUR) 10 MEQ tablet Take 10 mEq by mouth 2 (two) times daily.    08/30/2020 at Unknown time  . traMADol (ULTRAM) 50 MG tablet Take 50 mg by mouth every 6 (six) hours as needed for moderate pain (pain).    08/30/2020 at Unknown time  . Vitamin D, Ergocalciferol, (DRISDOL) 50000 UNITS CAPS capsule Take 50,000 Units by mouth every Monday.    08/28/2020  . omeprazole (PRILOSEC) 40 MG capsule Take 40 mg by mouth daily as needed (heartburn/ acid reflux).    unk  . Tiotropium Bromide-Olodaterol (STIOLTO RESPIMAT) 2.5-2.5 MCG/ACT AERS Inhale 1 puff into the lungs daily as needed (shortness of breath). (Patient not taking: Reported on 08/31/2020)   Not Taking at Unknown time    Inpatient Medications:  . atorvastatin  40 mg Oral Daily   . [START ON 09/01/2020] clonazePAM  0.5 mg Oral Daily  . [START ON 09/01/2020] DULoxetine  60 mg Oral QAC lunch  . enoxaparin (LOVENOX) injection  40 mg Subcutaneous Q24H  . insulin aspart  0-15 Units Subcutaneous TID WC  . insulin aspart  0-5 Units Subcutaneous QHS  . [START ON 09/01/2020] levothyroxine  75 mcg Oral QAC breakfast  . pantoprazole  40 mg Oral Daily  . sodium chloride flush  3 mL Intravenous Q12H    Allergies:  Allergies  Allergen Reactions  . Aspirin Other (See Comments)    Burns line in stomach    Social History   Socioeconomic History  . Marital status: Widowed    Spouse name: Not on file  . Number of children: Not on file  . Years of education: Not on file  . Highest education level: Not on file  Occupational History  .  Not on file  Tobacco Use  . Smoking status: Current Every Day Smoker    Packs/day: 2.00    Years: 37.00    Pack years: 74.00    Types: Cigarettes  . Smokeless tobacco: Never Used  Vaping Use  . Vaping Use: Never used  Substance and Sexual Activity  . Alcohol use: No  . Drug use: No  . Sexual activity: Never  Other Topics Concern  . Not on file  Social History Narrative  . Not on file   Social Determinants of Health   Financial Resource Strain:   . Difficulty of Paying Living Expenses: Not on file  Food Insecurity:   . Worried About Charity fundraiser in the Last Year: Not on file  . Ran Out of Food in the Last Year: Not on file  Transportation Needs:   . Lack of Transportation (Medical): Not on file  . Lack of Transportation (Non-Medical): Not on file  Physical Activity:   . Days of Exercise per Week: Not on file  . Minutes of Exercise per Session: Not on file  Stress:   . Feeling of Stress : Not on file  Social Connections:   . Frequency of Communication with Friends and Family: Not on file  . Frequency of Social Gatherings with Friends and Family: Not on file  . Attends Religious Services: Not on file  . Active  Member of Clubs or Organizations: Not on file  . Attends Archivist Meetings: Not on file  . Marital Status: Not on file  Intimate Partner Violence:   . Fear of Current or Ex-Partner: Not on file  . Emotionally Abused: Not on file  . Physically Abused: Not on file  . Sexually Abused: Not on file     Family History  Problem Relation Age of Onset  . Schizophrenia Maternal Grandfather      Review of Systems: All other systems reviewed and are otherwise negative except as noted above.  Physical Exam: Vitals:   08/31/20 1215 08/31/20 1230 08/31/20 1245 08/31/20 1300  BP: (!) 136/52 (!) 151/56 (!) 151/57 140/63  Pulse: 62 63 61 60  Resp: 20 15 19 19   Temp:      TempSrc:      SpO2: 98% 98% 98% 100%  Weight:      Height:        GEN- The patient is well appearing, alert and oriented x 3 today.   HEENT: normocephalic, atraumatic; sclera clear, conjunctiva pink; hearing intact; oropharynx clear; neck supple Lungs- Clear to ausculation bilaterally, normal work of breathing.  No wheezes, rales, rhonchi Heart- Regular rate and rhythm, no murmurs, rubs or gallops GI- Obese, soft, non-tender, non-distended, bowel sounds present Extremities- no clubbing, cyanosis, or edema; DP/PT/radial pulses 2+ bilaterally MS- no significant deformity or atrophy Skin- warm and dry, no rash or lesion Psych- euthymic mood, full affect Neuro- strength and sensation are intact  Labs:   Lab Results  Component Value Date   WBC 6.1 08/31/2020   HGB 8.4 (L) 08/31/2020   HCT 29.9 (L) 08/31/2020   MCV 79.7 (L) 08/31/2020   PLT 305 08/31/2020    Recent Labs  Lab 08/31/20 0502  NA 138  K 4.6  CL 103  CO2 21*  BUN 9  CREATININE 1.55*  CALCIUM 8.8*  PROT 6.7  BILITOT 0.5  ALKPHOS 99  ALT 19  AST 27  GLUCOSE 241*      Radiology/Studies: DG Chest 2 View  Result Date:  08/31/2020 CLINICAL DATA:  Shortness of breath EXAM: CHEST - 2 VIEW COMPARISON:  Multiple priors, most recent on  08/27/2019 FINDINGS: No borderline enlargement of the cardiac silhouette, similar to prior. Tortuous aorta. Central peribronchial thickening is similar over priors. Linear left basilar opacities, likely atelectasis/scar. No consolidation. No visible pleural effusions. No pneumothorax. IMPRESSION: 1. No evidence of acute cardiopulmonary abnormality. 2. Linear left basilar opacities, similar to priors and likely atelectasis/scar. 3. Similar chronic findings. Electronically Signed   By: Margaretha Sheffield MD   On: 08/31/2020 11:45    EKG: NSR at 66 bpm, LBBB 147 ms and 1st degree AV block ~ 280 ms (personally reviewed)  TELEMETRY: NSR 60s since arrival (personally reviewed)  Assessment/Plan: 1.  Bradycardia/Pre-syncope EMS strips personally reviewed and appear to show CHB though difficult to appreciate p waves. No 12 lead available.  Hold metoprol and monitor for recurrent bradycardia.  Pt had syncopal episode "about 4 months" ago under similar circumstances (got up in the nite to urinate, sat on edge of bed for a couple of minutes, stood up, took a few steps and passed out) Underlying conduction disease with LBBB and 1st degree AV block. Suspect will eventually need pacing, but pt would like to avoid/put off for as long as possible.  Lass dose of metoprolol yesterday evening.  Update Echo.  Update TSH  2. HTN She had not taken her morning medications.  Avoid AV nodal agents  3. Suspected sleep apnea Reports heavy snoring with no h/o sleep study. Recommend as outpatient.  4. Anemia Incidental finding. FOBT negative.   EP will follow along with you. Pt would prefer to avoid pacing, and now in NSR with stable rates. If conduction remains stable off AV nodal agents would plan follow up as outpatient.   For questions or updates, please contact [place] Please consult [email or web address] for contact info under Cardiology/STEMI.  Signed, Shirley Friar, PA-C  08/31/2020 2:40 PM  I  have seen, examined the patient, and reviewed the above assessment and plan.  Changes to above are made where necessary.  On exam, RRR.  The patient has chronic conduction system disease by ekg.  I suspect that she will at some point require pacing.  Currently, she would like to avoid this if possible. We will stop metoprolol and admit for further evaluation.  I would have a low threshold to proceed with PPM given longstanding first degree AV block, LBBB, and recurrent near syncope/ symptomatic bradycardia.  Dr Caryl Comes to reassess in AM  Co Sign: Thompson Grayer, MD

## 2020-08-31 NOTE — ED Triage Notes (Signed)
EMS called out for sob/dyspnea- on arrival pt in complete heart block w HR in 20s, hypotensive w sbp in 80s. EMS paced pt en route. Pt denies prior hx of heart block- takes metoprolol. Denies cp

## 2020-09-01 DIAGNOSIS — I442 Atrioventricular block, complete: Secondary | ICD-10-CM | POA: Diagnosis not present

## 2020-09-01 DIAGNOSIS — R55 Syncope and collapse: Secondary | ICD-10-CM | POA: Diagnosis not present

## 2020-09-01 LAB — COMPREHENSIVE METABOLIC PANEL
ALT: 16 U/L (ref 0–44)
AST: 18 U/L (ref 15–41)
Albumin: 3.1 g/dL — ABNORMAL LOW (ref 3.5–5.0)
Alkaline Phosphatase: 96 U/L (ref 38–126)
Anion gap: 10 (ref 5–15)
BUN: 8 mg/dL (ref 8–23)
CO2: 25 mmol/L (ref 22–32)
Calcium: 8.6 mg/dL — ABNORMAL LOW (ref 8.9–10.3)
Chloride: 106 mmol/L (ref 98–111)
Creatinine, Ser: 1.16 mg/dL — ABNORMAL HIGH (ref 0.44–1.00)
GFR calc Af Amer: 53 mL/min — ABNORMAL LOW (ref >60)
GFR calc non Af Amer: 46 mL/min — ABNORMAL LOW (ref >60)
Glucose, Bld: 113 mg/dL — ABNORMAL HIGH (ref 70–99)
Potassium: 3.8 mmol/L (ref 3.5–5.1)
Sodium: 141 mmol/L (ref 135–145)
Total Bilirubin: 0.3 mg/dL (ref 0.3–1.2)
Total Protein: 6.5 g/dL (ref 6.5–8.1)

## 2020-09-01 LAB — CBC
HCT: 28 % — ABNORMAL LOW (ref 36.0–46.0)
Hemoglobin: 8.2 g/dL — ABNORMAL LOW (ref 12.0–15.0)
MCH: 22.5 pg — ABNORMAL LOW (ref 26.0–34.0)
MCHC: 29.3 g/dL — ABNORMAL LOW (ref 30.0–36.0)
MCV: 76.9 fL — ABNORMAL LOW (ref 80.0–100.0)
Platelets: 254 10*3/uL (ref 150–400)
RBC: 3.64 MIL/uL — ABNORMAL LOW (ref 3.87–5.11)
RDW: 19.8 % — ABNORMAL HIGH (ref 11.5–15.5)
WBC: 6.7 10*3/uL (ref 4.0–10.5)
nRBC: 0 % (ref 0.0–0.2)

## 2020-09-01 LAB — GLUCOSE, CAPILLARY
Glucose-Capillary: 144 mg/dL — ABNORMAL HIGH (ref 70–99)
Glucose-Capillary: 203 mg/dL — ABNORMAL HIGH (ref 70–99)

## 2020-09-01 LAB — T3, FREE: T3, Free: 1.4 pg/mL — ABNORMAL LOW (ref 2.0–4.4)

## 2020-09-01 MED ORDER — LOSARTAN POTASSIUM 50 MG PO TABS
50.0000 mg | ORAL_TABLET | Freq: Every day | ORAL | Status: DC
  Administered 2020-09-01: 50 mg via ORAL
  Filled 2020-09-01: qty 1

## 2020-09-01 MED ORDER — LEVOTHYROXINE SODIUM 100 MCG PO TABS: 100.0000 ug | ORAL_TABLET | Freq: Every day | ORAL | 0 refills | Status: DC

## 2020-09-01 MED ORDER — FERROUS SULFATE 325 (65 FE) MG PO TABS
325.0000 mg | ORAL_TABLET | Freq: Three times a day (TID) | ORAL | Status: DC
  Administered 2020-09-01: 325 mg via ORAL
  Filled 2020-09-01: qty 1

## 2020-09-01 MED ORDER — FERROUS SULFATE 325 (65 FE) MG PO TABS: 325.0000 mg | ORAL_TABLET | Freq: Three times a day (TID) | ORAL | 0 refills | Status: AC

## 2020-09-01 MED ORDER — LEVOTHYROXINE SODIUM 100 MCG PO TABS: 100.0000 ug | ORAL_TABLET | Freq: Every day | ORAL | Status: DC

## 2020-09-01 NOTE — Evaluation (Signed)
Occupational Therapy Evaluation Patient Details Name: Rhonda Santos MRN: [id number] DOB: 1946-01-21 Today's Date: 09/01/2020    History of Present Illness 74 y.o. female with medical history significant of hypertension, hyperlipidemia, type 2 diabetes mellitus, hypothyroidism, cirrhosis secondary to Karlene Lineman, CKD stage III, anxiety/depression, morbid obesity, tobacco abuse, COPD(on PRN oxygen at home), GERD, chronic diastolic CHF presents to emergency department for evaluation of near syncope. Pt found to be bradycardic (in 20s) and low BP 80s in the field. Vitals improved in ED. Admitted 08/31/20 for treatment of bradycardia and acute on chronic CKD   Clinical Impression   Patient is presenting at or near baseline for ADL, toilet skills and functional mobility.  At home she is able to complete bathing, dressing and toileting with appropriate DME and increased time/effort.  Her daughter assists with med setup, community mobility, home management and meals.  Daughter will assist with LB ADL if needed.  Patient is determined to go home as soon as possible.  No further needs in the acute setting and no HH OT recommended.  Up with nursing and ambulating to the toilet.      Follow Up Recommendations  No OT follow up    Equipment Recommendations  3 in 1 bedside commode;Tub/shower bench    Recommendations for Other Services       Precautions / Restrictions Precautions Precautions: Fall Restrictions Weight Bearing Restrictions: No      Mobility Bed Mobility Overal bed mobility: Modified Independent             General bed mobility comments: SR's and HOB elevated  Transfers Overall transfer level: Needs assistance Equipment used: Rolling walker (2 wheeled) Transfers: Sit to/from [facility] Sit to Stand: Supervision Stand pivot transfers: Min guard       General transfer comment: management of lines and leads.  No physical assist needed.    Balance Overall  balance assessment: No apparent balance deficits (not formally assessed)                                         ADL either performed or assessed with clinical judgement   ADL Overall ADL's : Needs assistance/impaired Eating/Feeding: Independent;Sitting   Grooming: Wash/dry hands;Wash/dry face;Set up   Upper Body Bathing: Set up;Sitting   Lower Body Bathing: Minimal assistance;Sit to/from stand     Upper Body Dressing Details (indicate cue type and reason): assist with gown due to lines and leads. Lower Body Dressing: Minimal assistance;Sit to/from Health and safety inspector Details (indicate cue type and reason): Pure Elza Rafter         Functional mobility during ADLs: Supervision/safety General ADL Comments: Patient appears to be at or near baseline excluding lines and leads.     Vision Baseline Vision/History: Wears glasses Wears Glasses: Reading only;Distance only Patient Visual Report: Blurring of vision;Other (comment) (cataract to L eye)       Perception     Praxis      Pertinent Vitals/Pain Pain Assessment: No/denies pain     Hand Dominance Left   Extremity/Trunk Assessment Upper Extremity Assessment Upper Extremity Assessment: Overall WFL for tasks assessed   Lower Extremity Assessment Lower Extremity Assessment: Defer to PT evaluation       Communication Communication Communication: No difficulties   Cognition Arousal/Alertness: Awake/alert Behavior During Therapy: WFL for tasks assessed/performed Overall Cognitive Status: Within Functional Limits for tasks assessed  General Comments  patient with long standing B knee arthritis.  States she has good and bad days.  Uses RW on days knees hurt.    Exercises     Shoulder Instructions      Home Living Family/patient expects to be discharged to:: Private residence Living Arrangements: Children Available Help at Discharge:  Family Type of Home: House       Home Layout: One level     Bathroom Shower/Tub: Teacher, early years/pre: Handicapped height Bathroom Accessibility: Yes How Accessible: Accessible via walker Home Equipment: Shower seat;Walker - 4 wheels;Hand held shower head;Wheelchair - manual;Other (comment) (lift chair)          Prior Functioning/Environment Level of Independence: Needs assistance  Gait / Transfers Assistance Needed: Mod I ADL's / Homemaking Assistance Needed: Mod I ADL.  Daughter assists with community mobility, home management, meal prep. Communication / Swallowing Assistance Needed: Independent Comments: 5UDJ        OT Problem List: Other (comment) (lines and leads impact mobility and ADL status.)      OT Treatment/Interventions:      OT Goals(Current goals can be found in the care plan section) Acute Rehab OT Goals Patient Stated Goal: I'm going home today.  I'll think about the pacemaker. OT Goal Formulation: With patient Time For Goal Achievement: 09/04/20 Potential to Achieve Goals: Good  OT Frequency:     Barriers to D/C:            Co-evaluation              AM-PAC OT "6 Clicks" Daily Activity     Outcome Measure Help from another person eating meals?: None Help from another person taking care of personal grooming?: None Help from another person toileting, which includes using toliet, bedpan, or urinal?: A Little Help from another person bathing (including washing, rinsing, drying)?: A Little Help from another person to put on and taking off regular upper body clothing?: A Little Help from another person to put on and taking off regular lower body clothing?: A Little 6 Click Score: 20   End of Session Equipment Utilized During Treatment: Rolling walker Nurse Communication: Mobility status;Other (comment) (patient up in recliner)  Activity Tolerance: Patient tolerated treatment well;No increased pain Patient left: in chair;with  call bell/phone within reach  OT Visit Diagnosis: Unsteadiness on feet (R26.81);Other (comment) (syncope)                Time: 4970-2637 OT Time Calculation (min): 40 min Charges:  OT General Charges $OT Visit: 1 Visit OT Evaluation $OT Eval Moderate Complexity: 1 Mod OT Treatments $Self Care/Home Management : 23-37 mins  09/01/2020  Rich, OTR/L  Acute Rehabilitation Services  Office:  [place] 09/01/2020, 9:01 AM

## 2020-09-01 NOTE — Care Management Obs Status (Signed)
[place] NOTIFICATION   Patient Details  Name: Rhonda Santos MRN: [id number] Date of Birth: [birth year]   Medicare Observation Status Notification Given:  Yes    Bethena Roys, RN 09/01/2020, 1:40 PM

## 2020-09-01 NOTE — Progress Notes (Addendum)
Electrophysiology Rounding Note  Patient Name: Rhonda Santos Date of Encounter: 09/01/2020  Primary Cardiologist: No primary care provider on file. Electrophysiologist: New   Subjective   The patient is doing well today.  At this time, the patient denies chest pain, shortness of breath, or any new concerns.  Inpatient Medications    Scheduled Meds: . atorvastatin  40 mg Oral Daily  . clonazePAM  0.5 mg Oral Daily  . DULoxetine  60 mg Oral QAC lunch  . enoxaparin (LOVENOX) injection  40 mg Subcutaneous Q24H  . insulin aspart  0-15 Units Subcutaneous TID WC  . insulin aspart  0-5 Units Subcutaneous QHS  . levothyroxine  75 mcg Oral QAC breakfast  . pantoprazole  40 mg Oral Daily  . sodium chloride flush  3 mL Intravenous Q12H   Continuous Infusions: . sodium chloride 75 mL/hr at 09/01/20 0632   PRN Meds: acetaminophen **OR** acetaminophen, albuterol, gabapentin, traMADol   Vital Signs    Vitals:   08/31/20 2010 08/31/20 2039 09/01/20 0125 09/01/20 0515  BP: (!) 159/58 128/90 (!) 163/61 (!) 154/58  Pulse: 66 65 64 65  Resp: 18 19 15 15   Temp: 98.4 F (36.9 C)  98 F (36.7 C) 98.3 F (36.8 C)  TempSrc: Oral  Oral Oral  SpO2: 96% 100% 94% 95%  Weight:    89.7 kg  Height:        Intake/Output Summary (Last 24 hours) at 09/01/2020 0800 Last data filed at 09/01/2020 0650 Gross per 24 hour  Intake 240 ml  Output 500 ml  Net -260 ml   Filed Weights   08/31/20 0504 08/31/20 1450 09/01/20 0515  Weight: 90.7 kg 89.1 kg 89.7 kg    Physical Exam    GEN- The patient is elderly appearing, alert and oriented x 3 today.   Head- normocephalic, atraumatic Eyes-  Sclera clear, conjunctiva pink Ears- hearing intact Oropharynx- clear Neck- supple Lungs- Clear to ausculation bilaterally, normal work of breathing Heart- Regular rate and rhythm, no murmurs, rubs or gallops GI- soft, NT, ND, + BS Extremities- no clubbing or cyanosis. No edema Skin- no rash or  lesion Psych- euthymic mood, full affect Neuro- strength and sensation are intact  Labs    CBC Recent Labs    08/31/20 0502 09/01/20 0356  WBC 6.1 6.7  NEUTROABS 3.9  --   HGB 8.4* 8.2*  HCT 29.9* 28.0*  MCV 79.7* 76.9*  PLT 305 267   Basic Metabolic Panel Recent Labs    08/31/20 0502 08/31/20 1403 09/01/20 0356  NA 138  --  141  K 4.6  --  3.8  CL 103  --  106  CO2 21*  --  25  GLUCOSE 241*  --  113*  BUN 9  --  8  CREATININE 1.55*  --  1.16*  CALCIUM 8.8*  --  8.6*  MG  --  2.0  --    Liver Function Tests Recent Labs    08/31/20 0502 09/01/20 0356  AST 27 18  ALT 19 16  ALKPHOS 99 96  BILITOT 0.5 0.3  PROT 6.7 6.5  ALBUMIN 3.1* 3.1*   No results for input(s): LIPASE, AMYLASE in the last 72 hours. Cardiac Enzymes No results for input(s): CKTOTAL, CKMB, CKMBINDEX, TROPONINI in the last 72 hours.   Telemetry    SB/NSR 50-60s (personally reviewed)  Radiology    DG Chest 2 View  Result Date: 08/31/2020 CLINICAL DATA:  Shortness of breath EXAM: CHEST -  2 VIEW COMPARISON:  Multiple priors, most recent on 08/27/2019 FINDINGS: No borderline enlargement of the cardiac silhouette, similar to prior. Tortuous aorta. Central peribronchial thickening is similar over priors. Linear left basilar opacities, likely atelectasis/scar. No consolidation. No visible pleural effusions. No pneumothorax. IMPRESSION: 1. No evidence of acute cardiopulmonary abnormality. 2. Linear left basilar opacities, similar to priors and likely atelectasis/scar. 3. Similar chronic findings. Electronically Signed   By: Margaretha Sheffield MD   On: 08/31/2020 11:45   ECHOCARDIOGRAM COMPLETE  Result Date: 08/31/2020    ECHOCARDIOGRAM REPORT   Patient Name:   Rhonda Santos Date of Exam: 08/31/2020 Medical Rec #:  [id number]       Height:       65.0 in Accession #:    [id number]      Weight:       200.0 lb Date of Birth:  October 08, 1946       BSA:          1.978 m Patient Age:    74 years        BP:            140/63 mmHg Patient Gender: F               HR:           60 bpm. Exam Location:  Inpatient Procedure: 2D Echo, Cardiac Doppler, Color Doppler and Intracardiac            Opacification Agent STAT ECHO Indications:    Heartblock  History:        Patient has prior history of Echocardiogram examinations, most                 recent 09/06/2015. COPD; Risk Factors:Diabetes, Hypertension and                 Current Smoker. GERD. H/O pulmonary embolus.  Sonographer:    Clayton Lefort RDCS (AE) Referring Phys: [id number] [facility]  Sonographer Comments: Suboptimal apical window and suboptimal parasternal window. IMPRESSIONS  1. Left ventricular ejection fraction, by estimation, is 60 to 65%. The left ventricle has normal function. The left ventricle has no regional wall motion abnormalities. There is moderate left ventricular hypertrophy. Left ventricular diastolic parameters are consistent with Grade II diastolic dysfunction (pseudonormalization).  2. Right ventricular systolic function is normal. The right ventricular size is normal. There is normal pulmonary artery systolic pressure.  3. Left atrial size was mildly dilated.  4. A small pericardial effusion is present.  5. The mitral valve is normal in structure. Trivial mitral valve regurgitation. No evidence of mitral stenosis.  6. The aortic valve is tricuspid. There is moderate calcification of the aortic valve. There is mild thickening of the aortic valve. Aortic valve regurgitation is trivial. Mild aortic valve sclerosis is present, with no evidence of aortic valve stenosis.  7. The inferior vena cava is normal in size with greater than 50% respiratory variability, suggesting right atrial pressure of 3 mmHg. Comparison(s): No significant change from prior study. FINDINGS  Left Ventricle: Left ventricular ejection fraction, by estimation, is 60 to 65%. The left ventricle has normal function. The left ventricle has no regional wall motion abnormalities.  Definity contrast agent was given IV to delineate the left ventricular  endocardial borders. The left ventricular internal cavity size was normal in size. There is moderate left ventricular hypertrophy. Left ventricular diastolic parameters are consistent with Grade II diastolic dysfunction (pseudonormalization). Right Ventricle: The right ventricular size is normal.  No increase in right ventricular wall thickness. Right ventricular systolic function is normal. There is normal pulmonary artery systolic pressure. The tricuspid regurgitant velocity is 2.54 m/s, and  with an assumed right atrial pressure of 3 mmHg, the estimated right ventricular systolic pressure is 82.9 mmHg. Left Atrium: Left atrial size was mildly dilated. Right Atrium: Right atrial size was normal in size. Pericardium: A small pericardial effusion is present. Mitral Valve: The mitral valve is normal in structure. Trivial mitral valve regurgitation. No evidence of mitral valve stenosis. MV peak gradient, 4.2 mmHg. The mean mitral valve gradient is 2.0 mmHg. Tricuspid Valve: The tricuspid valve is normal in structure. Tricuspid valve regurgitation is trivial. Aortic Valve: The aortic valve is tricuspid. There is moderate calcification of the aortic valve. There is mild thickening of the aortic valve. Aortic valve regurgitation is trivial. Mild aortic valve sclerosis is present, with no evidence of aortic valve stenosis. Aortic valve mean gradient measures 7.0 mmHg. Aortic valve peak gradient measures 11.6 mmHg. Aortic valve area, by VTI measures 1.79 cm. Pulmonic Valve: The pulmonic valve was not well visualized. Pulmonic valve regurgitation is not visualized. Aorta: The aortic root and ascending aorta are structurally normal, with no evidence of dilitation. Venous: The inferior vena cava is normal in size with greater than 50% respiratory variability, suggesting right atrial pressure of 3 mmHg. IAS/Shunts: The atrial septum is grossly normal.   LEFT VENTRICLE PLAX 2D LVIDd:         3.70 cm  Diastology LVIDs:         2.00 cm  LV e' medial:    5.22 cm/s LV PW:         1.30 cm  LV E/e' medial:  20.9 LV IVS:        1.60 cm  LV e' lateral:   5.50 cm/s LVOT diam:     2.00 cm  LV E/e' lateral: 19.8 LV SV:         78 LV SV Index:   39 LVOT Area:     3.14 cm  RIGHT VENTRICLE             IVC RV Basal diam:  2.40 cm     IVC diam: 1.70 cm RV S prime:     11.30 cm/s TAPSE (M-mode): 2.1 cm LEFT ATRIUM             Index       RIGHT ATRIUM           Index LA diam:        3.50 cm 1.77 cm/m  RA Area:     12.60 cm LA Vol (A2C):   55.7 ml 28.16 ml/m RA Volume:   27.40 ml  13.85 ml/m LA Vol (A4C):   49.4 ml 24.97 ml/m LA Biplane Vol: 55.4 ml 28.01 ml/m  AORTIC VALVE AV Area (Vmax):    1.71 cm AV Area (Vmean):   1.67 cm AV Area (VTI):     1.79 cm AV Vmax:           170.33 cm/s AV Vmean:          126.667 cm/s AV VTI:            0.435 m AV Peak Grad:      11.6 mmHg AV Mean Grad:      7.0 mmHg LVOT Vmax:         92.70 cm/s LVOT Vmean:        67.300 cm/s LVOT VTI:  0.248 m LVOT/AV VTI ratio: 0.57  AORTA Ao Root diam: 3.00 cm Ao Asc diam:  3.10 cm MITRAL VALVE                TRICUSPID VALVE MV Area (PHT): 2.54 cm     TR Peak grad:   25.8 mmHg MV Peak grad:  4.2 mmHg     TR Vmax:        254.00 cm/s MV Mean grad:  2.0 mmHg MV Vmax:       1.03 m/s     SHUNTS MV Vmean:      62.6 cm/s    Systemic VTI:  0.25 m MV Decel Time: 299 msec     Systemic Diam: 2.00 cm MV E velocity: 109.00 cm/s MV A velocity: 90.20 cm/s MV E/A ratio:  1.21 Buford Dresser MD Electronically signed by Buford Dresser MD Signature Date/Time: 08/31/2020/4:00:00 PM    Final     Patient Profile     Rhonda Santos is a 74 y.o. female with a history of chronic diastolic CHF, LBBB, HTN, HLD, DM2, COPD, thyroid disease, PE, cirrhosis, 2/2 Non-alcoholic fatty liver disease, CKD III, anxiety/depression, obesity, and tobacco abuse who is being seen today for the evaluation of bradycarda at  the request of Dr. Gardiner Rhyme.  Assessment & Plan    1.  Bradycardia/Pre-syncope EMS strips personally reviewed and appear to show CHB though difficult to appreciate p waves. No 12 lead available.  Hold metoprol and monitor for recurrent bradycardia.  Will discuss method of outpatient monitoring further with Dr. Caryl Comes.  Pt had syncopal episode "about 4 months" ago under similar circumstances (got up in the nite to urinate, sat on edge of bed for a couple of minutes, stood up, took a few steps and passed out) Underlying conduction disease with LBBB and 1st degree AV block. Suspect will eventually need pacing, but pt would like to avoid/put off for as long as possible.  Echo with normal EF.  TSH elevated. T4 low  2. HTN She had not taken her morning medications.  466/599J systolic this am. Titrate meds as tolerated. Avoid AV nodal agents  3. Suspected sleep apnea Reports heavy snoring with no h/o sleep study. Recommend as outpatient.   4. Anemia Incidental finding. FOBT negative.   For questions or updates, please contact [facility] Please consult [email or web address] for contact info under Cardiology/STEMI.  Signed, Shirley Friar, PA-C  09/01/2020, 8:00 AM   Bradycardia   Syncope presyncope  1AVB  LBBB borderline  Obesity    Anemia  Sleep disordered breathng  Hypertension  The patient has had syncope and presyncope; was found to have bradycardia the mechanism of which is not clear from the strips reviewed here today.  ECG is pending.  The syncopal episode within the year has features strongly suggestive of dysautonomia as opposed to a bradycardia arrhythmia suggested by her conduction system disease in that the recovery was quite protracted, her awakening on arrival of EMS many minutes later.  The presyncopal episode that prompted this admission was also quite protracted, triggered by pain.  We await the EMS record as to blood pressure and ECG.  Metoprolol may  well contribute to her bradycardia arrhythmia; although it is unlikely relatively given the low dose and and her long-term exposure.  I suspect that she will have more bradycardia arrhythmias.  In that regard, we have discussed that I do not think pacing is indicated in the context of bradycardia arrhythmias and beta-blockers and the  suggested that there may be an autonomic component.  We discussed the alternatives of monitoring for which I had recommended an implantable device given the likely nature of her recurrence over a nonimmediate timeframe versus doing nothing at all and allowing her symptoms to redeclare.  Discussions with Dr. Greggory Brandy included the latter option.  She is inclined towards that option.  Hence, we will sign off

## 2020-09-01 NOTE — Discharge Summary (Signed)
Physician Discharge Summary  Rhonda Santos UXN:[id number] DOB: May 11, 1946 DOA: 08/31/2020  PCP: Vidal Schwalbe, MD  Admit date: 08/31/2020 Discharge date: 09/01/2020  Admitted From: Home Disposition: Home health  Recommendations for Outpatient Follow-up:  1. Follow up with PCP in 1-2 weeks 2. Please obtain BMP/CBC in one week 3. Please follow up on the following pending results:  Home Health: Yes Equipment/Devices: Rollator  Discharge Condition: Stable Code Status:   Code Status: Full Code Diet recommendation:  Diet Order            Diet - low sodium heart healthy           Diet heart healthy/carb modified Room service appropriate? Yes; Fluid consistency: Thin  Diet effective now                  Brief/Interim Summary: As per HPI: 74 y.o. female with medical history significant of hypertension, hyperlipidemia, type 2 diabetes mellitus, hypothyroidism, cirrhosis secondary to Karlene Lineman, CKD stage III, anxiety/depression, morbid obesity, tobacco abuse, COPD(on PRN oxygen at home), GERD, chronic diastolic CHF presents to emergency department for evaluation of near syncope.  Patient tells me that last night when she woke up from sleep to go to bathroom and while she was ambulating she felt that she was going to pass out and felt extremely weak and nauseous.  Her family called EMS and was found to have severe bradycardia with a heart rate in low 20s and low blood pressure in 80s.  EMS placed patient in route to ED.  Her heart rate improved in 50s/60s.    She denies head trauma, seizures, loss of consciousness, slurred speech, headache, blurry vision, chest pain, shortness of breath, palpitation, leg swelling, fever, chills, cough, congestion, vomiting, diarrhea, abdominal pain, urinary or bowel changes.  Her daughter lives with her.  She smokes 1 to 2 pack of cigarettes per day, denies alcohol, listed drug use.  She uses walker for ambulation.  ED Course: Upon arrival: Patient's heart  rate noted to be in 50s/60s, blood pressure is stable, afebrile with no leukocytosis, H&H shows microcytic anemia, CMP shows worsening kidney function with creatinine of 1.5, therefore of 32, BNP: 162, COVID-19 negative, troponin x2 -, magnesium, TSH, T4, T3: Pending.  Chest x-ray came back negative for acute findings.  EDP consulted cardiology.  [facility] hospitalist consulted for admission due to near syncope  Patient is admitted, monitored overnight for near syncope/bradycardia Seen by cardiology next morning Awaiting EKG from outside facility before clearing for discharge today   Discharge Diagnoses:   Near syncope/Bradycardia: EMS history of same complete heart block though difficult to appreciate P waves not clearly available, patient has been on metoprolol and has been discontinued.  Bradycardia has resolved.  Seen by EP cardiology, underwent TSH up at 31 and FT4 low at  0.4. echo unremarkable.  Diabetes mellitus without complication.  Last hemoglobin A1c Lab Results  Component Value Date   HGBA1C 7.3 (H) 08/31/2020  Blood sugar is controlled  Acute on chronic kidney failure IIIb.  Baseline creatinine around 1.1 and improved. Recent Labs  Lab 08/31/20 0502 09/01/20 0356  BUN 9 8  CREATININE 1.55* 1.16*   Hypothyroidism- TSH up at 31 and FT4 low at  0.4-appears to be underdosed -increase Synthroid two hundred MCG and follow-up thyroid function testing in 4 weeks to adjust diet   HTN (hypertension): No more hypotension blood pressure slowly trending up, will be discharged on medication as per cardiology  GERD continue PPI  Microcytic  anemia chronivciron deficiency anemia-hemoglobin stable at 8.2 g.  Outpatient follow-up in a week was 10.4 last year.  She has had no adnexal anemia had colonoscopy and EGD in two thousand seventeen that showed normal EGD, two polyps in the colon that were resected had internal hemorrhoids.  She patient instructed to follow-up with PCP for repeat CBC  monitoring continue iron supplementation Recent Labs  Lab 08/31/20 0502 09/01/20 0356  HGB 8.4* 8.2*  HCT 29.9* 28.0*   Iron/TIBC/Ferritin/ %Sat    Component Value Date/Time   IRON 39 08/31/2020 1403   TIBC 487 (H) 08/31/2020 1403   FERRITIN 7 (L) 08/31/2020 1403   IRONPCTSAT 8 (L) 08/31/2020 1403   Hyperlipidemia on statin Morbid obesity BMI thirty-two will benefit with weight loss. Tobacco abuse - we discussed about tobacco cessation. Depression/anxiety continue Klonopin and Cymbalta  Deconditioning, PT OT evaluation completed will be discharged on three one and home health PT.  Consults:  cardiology  Subjective: Resting well eating breakfast no nausea vomiting chest pain.  Chills.  Worked with physical therapy and have advised home health PT.  Discharge Exam: Vitals:   09/01/20 0125 09/01/20 0515  BP: (!) 163/61 (!) 154/58  Pulse: 64 65  Resp: 15 15  Temp: 98 F (36.7 C) 98.3 F (36.8 C)  SpO2: 94% 95%   General: Pt is alert, awake, not in acute distress Cardiovascular: RRR, S1/S2 +, no rubs, no gallops Respiratory: CTA bilaterally, no wheezing, no rhonchi Abdominal: Soft, NT, ND, bowel sounds + Extremities: no edema, no cyanosis  Discharge Instructions  Discharge Instructions    Diet - low sodium heart healthy   Complete by: As directed    Discharge instructions   Complete by: As directed    Please do not take your metoprolol.  Please follow-up with your primary care doctor for blood pressure monitoring and medication adjustment.  Also follow-up with the cardiologist. Please check your blood count in next 5 to 7 days and keep on iron supplementation follow-up with your primary care doctor to address your anemia  Please call call MD or return to ER for similar or worsening recurring problem that brought you to hospital or if any fever,nausea/vomiting,abdominal pain, uncontrolled pain, chest pain,  shortness of breath or any other alarming  symptoms.  Please follow-up your doctor as instructed in a week time and call the office for appointment.  Please avoid alcohol, smoking, or any other illicit substance and maintain healthy habits including taking your regular medications as prescribed.  You were cared for by a hospitalist during your hospital stay. If you have any questions about your discharge medications or the care you received while you were in the hospital after you are discharged, you can call the unit and ask to speak with the hospitalist on call if the hospitalist that took care of you is not available.  Once you are discharged, your primary care physician will handle any further medical issues. Please note that NO REFILLS for any discharge medications will be authorized once you are discharged, as it is imperative that you return to your primary care physician (or establish a relationship with a primary care physician if you do not have one) for your aftercare needs so that they can reassess your need for medications and monitor your lab values   Increase activity slowly   Complete by: As directed      Allergies as of 09/01/2020      Reactions   Aspirin Other (See Comments)  Burns line in stomach      Medication List    STOP taking these medications   meloxicam 7.5 MG tablet Commonly known as: MOBIC   metoprolol tartrate 25 MG tablet Commonly known as: LOPRESSOR     TAKE these medications   albuterol 108 (90 Base) MCG/ACT inhaler Commonly known as: VENTOLIN HFA Take 2 puffs 3 times daily for 5 more days and then every 6 hours as needed thereafter. What changed:   how much to take  how to take this  when to take this  reasons to take this  additional instructions   albuterol (2.5 MG/3ML) 0.083% nebulizer solution Commonly known as: PROVENTIL Take 2.5 mg by nebulization every 4 (four) hours as needed for wheezing or shortness of breath. What changed: Another medication with the same name was  changed. Make sure you understand how and when to take each.   Amaryl 1 MG tablet Generic drug: glimepiride Take 1 mg by mouth daily with breakfast.   atorvastatin 40 MG tablet Commonly known as: LIPITOR Take 40 mg by mouth daily.   clonazePAM 0.5 MG tablet Commonly known as: KLONOPIN Take 0.5 mg by mouth daily.   diclofenac sodium 1 % Gel Commonly known as: VOLTAREN Apply 1 application topically 3 (three) times daily as needed (pain).   DULoxetine 60 MG capsule Commonly known as: CYMBALTA Take 60 mg by mouth daily before lunch.   ferrous sulfate 325 (65 FE) MG tablet Take 1 tablet (325 mg total) by mouth 3 (three) times daily with meals.   furosemide 40 MG tablet Commonly known as: LASIX Take 40 mg by mouth daily as needed for fluid.   gabapentin 600 MG tablet Commonly known as: NEURONTIN Take 600 mg by mouth 3 (three) times daily as needed (pain).   levothyroxine 100 MCG tablet Commonly known as: SYNTHROID Take 1 tablet (100 mcg total) by mouth daily before breakfast. Start taking on: September 02, 2020 What changed:   medication strength  how much to take   losartan 50 MG tablet Commonly known as: COZAAR Take 50 mg by mouth daily.   metFORMIN 500 MG tablet Commonly known as: GLUCOPHAGE Take 500 mg by mouth 2 (two) times daily.   omeprazole 40 MG capsule Commonly known as: PRILOSEC Take 40 mg by mouth daily as needed (heartburn/ acid reflux).   potassium chloride 10 MEQ tablet Commonly known as: KLOR-CON Take 10 mEq by mouth 2 (two) times daily.   Stiolto Respimat 2.5-2.5 MCG/ACT Aers Generic drug: Tiotropium Bromide-Olodaterol Inhale 1 puff into the lungs daily as needed (shortness of breath).   traMADol 50 MG tablet Commonly known as: ULTRAM Take 50 mg by mouth every 6 (six) hours as needed for moderate pain (pain).   Vitamin D (Ergocalciferol) 1.25 MG (50000 UNIT) Caps capsule Commonly known as: DRISDOL Take 50,000 Units by mouth every  Monday.            Durable Medical Equipment  (From admission, onward)         Start     Ordered   09/01/20 1131  For home use only DME 3 n 1  Once        09/01/20 1130          Follow-up Information    Allred, Jeneen Rinks, MD Follow up.   Specialty: Cardiology Why: We will call you for an appointment Contact information: [place] [address] [phone]        Vidal Schwalbe,  MD Follow up in 1 week(s).   Specialty: Family Medicine Why: For evaluation of  anemia possible GI follow-up. cbc in 1 wk Contact information: [address] [place] [address] [phone]        [facility] Follow up.   Why: Rollator, Bedside Commode, Shower Chair-office to deliver to bedside prior to transition home.  Contact information: [address] [place] [address] [phone]              Allergies  Allergen Reactions  . Aspirin Other (See Comments)    Burns line in stomach    The results of significant diagnostics from this hospitalization (including imaging, microbiology, ancillary and laboratory) are listed below for reference.    Microbiology: Recent Results (from the past 240 hour(s))  SARS Coronavirus 2 by RT PCR (hospital order, performed in [facility] hospital lab) Nasopharyngeal Nasopharyngeal Swab     Status: None   Collection Time: 08/31/20  6:37 AM   Specimen: Nasopharyngeal Swab  Result Value Ref Range Status   SARS Coronavirus 2 NEGATIVE NEGATIVE Final    Comment: (NOTE) SARS-CoV-2 target nucleic acids are NOT DETECTED.  The SARS-CoV-2 RNA is generally detectable in upper and lower respiratory specimens during the acute phase of infection. The lowest concentration of SARS-CoV-2 viral copies this assay can detect is 250 copies / mL. A negative result does not preclude SARS-CoV-2 infection and should not be used as the sole basis for treatment or other patient management decisions.  A negative result may  occur with improper specimen collection / handling, submission of specimen other than nasopharyngeal swab, presence of viral mutation(s) within the areas targeted by this assay, and inadequate number of viral copies (<250 copies / mL). A negative result must be combined with clinical observations, patient history, and epidemiological information.  Fact Sheet for Patients:   StrictlyIdeas.no  Fact Sheet for Healthcare Providers: BankingDealers.co.za  This test is not yet approved or  cleared by the [place] FDA and has been authorized for detection and/or diagnosis of SARS-CoV-2 by FDA under an Emergency Use Authorization (EUA).  This EUA will remain in effect (meaning this test can be used) for the duration of the COVID-19 declaration under Section 564(b)(1) of the Act, 21 U.S.C. section 360bbb-3(b)(1), unless the authorization is terminated or revoked sooner.  Performed at [facility], [place] [address] [place], [place] [address]     Procedures/Studies: DG Chest 2 View  Result Date: 08/31/2020 CLINICAL DATA:  Shortness of breath EXAM: CHEST - 2 VIEW COMPARISON:  Multiple priors, most recent on 08/27/2019 FINDINGS: No borderline enlargement of the cardiac silhouette, similar to prior. Tortuous aorta. Central peribronchial thickening is similar over priors. Linear left basilar opacities, likely atelectasis/scar. No consolidation. No visible pleural effusions. No pneumothorax. IMPRESSION: 1. No evidence of acute cardiopulmonary abnormality. 2. Linear left basilar opacities, similar to priors and likely atelectasis/scar. 3. Similar chronic findings. Electronically Signed   By: Margaretha Sheffield MD   On: 08/31/2020 11:45   ECHOCARDIOGRAM COMPLETE  Result Date: 08/31/2020    ECHOCARDIOGRAM REPORT   Patient Name:   Rhonda Santos Osso Date of Exam: 08/31/2020 Medical Rec #:  [id number]       Height:       65.0 in Accession #:    [id number]       Weight:       200.0 lb Date of Birth:  12-02-1946       BSA:  1.978 m Patient Age:    14 years        BP:           140/63 mmHg Patient Gender: F               HR:           60 bpm. Exam Location:  Inpatient Procedure: 2D Echo, Cardiac Doppler, Color Doppler and Intracardiac            Opacification Agent STAT ECHO Indications:    Heartblock  History:        Patient has prior history of Echocardiogram examinations, most                 recent 09/06/2015. COPD; Risk Factors:Diabetes, Hypertension and                 Current Smoker. GERD. H/O pulmonary embolus.  Sonographer:    Clayton Lefort RDCS (AE) Referring Phys: [id number] [facility]  Sonographer Comments: Suboptimal apical window and suboptimal parasternal window. IMPRESSIONS  1. Left ventricular ejection fraction, by estimation, is 60 to 65%. The left ventricle has normal function. The left ventricle has no regional wall motion abnormalities. There is moderate left ventricular hypertrophy. Left ventricular diastolic parameters are consistent with Grade II diastolic dysfunction (pseudonormalization).  2. Right ventricular systolic function is normal. The right ventricular size is normal. There is normal pulmonary artery systolic pressure.  3. Left atrial size was mildly dilated.  4. A small pericardial effusion is present.  5. The mitral valve is normal in structure. Trivial mitral valve regurgitation. No evidence of mitral stenosis.  6. The aortic valve is tricuspid. There is moderate calcification of the aortic valve. There is mild thickening of the aortic valve. Aortic valve regurgitation is trivial. Mild aortic valve sclerosis is present, with no evidence of aortic valve stenosis.  7. The inferior vena cava is normal in size with greater than 50% respiratory variability, suggesting right atrial pressure of 3 mmHg. Comparison(s): No significant change from prior study. FINDINGS  Left Ventricle: Left ventricular ejection fraction, by estimation,  is 60 to 65%. The left ventricle has normal function. The left ventricle has no regional wall motion abnormalities. Definity contrast agent was given IV to delineate the left ventricular  endocardial borders. The left ventricular internal cavity size was normal in size. There is moderate left ventricular hypertrophy. Left ventricular diastolic parameters are consistent with Grade II diastolic dysfunction (pseudonormalization). Right Ventricle: The right ventricular size is normal. No increase in right ventricular wall thickness. Right ventricular systolic function is normal. There is normal pulmonary artery systolic pressure. The tricuspid regurgitant velocity is 2.54 m/s, and  with an assumed right atrial pressure of 3 mmHg, the estimated right ventricular systolic pressure is 88.5 mmHg. Left Atrium: Left atrial size was mildly dilated. Right Atrium: Right atrial size was normal in size. Pericardium: A small pericardial effusion is present. Mitral Valve: The mitral valve is normal in structure. Trivial mitral valve regurgitation. No evidence of mitral valve stenosis. MV peak gradient, 4.2 mmHg. The mean mitral valve gradient is 2.0 mmHg. Tricuspid Valve: The tricuspid valve is normal in structure. Tricuspid valve regurgitation is trivial. Aortic Valve: The aortic valve is tricuspid. There is moderate calcification of the aortic valve. There is mild thickening of the aortic valve. Aortic valve regurgitation is trivial. Mild aortic valve sclerosis is present, with no evidence of aortic valve stenosis. Aortic valve mean gradient measures 7.0 mmHg. Aortic valve peak gradient  measures 11.6 mmHg. Aortic valve area, by VTI measures 1.79 cm. Pulmonic Valve: The pulmonic valve was not well visualized. Pulmonic valve regurgitation is not visualized. Aorta: The aortic root and ascending aorta are structurally normal, with no evidence of dilitation. Venous: The inferior vena cava is normal in size with greater than 50%  respiratory variability, suggesting right atrial pressure of 3 mmHg. IAS/Shunts: The atrial septum is grossly normal.  LEFT VENTRICLE PLAX 2D LVIDd:         3.70 cm  Diastology LVIDs:         2.00 cm  LV e' medial:    5.22 cm/s LV PW:         1.30 cm  LV E/e' medial:  20.9 LV IVS:        1.60 cm  LV e' lateral:   5.50 cm/s LVOT diam:     2.00 cm  LV E/e' lateral: 19.8 LV SV:         78 LV SV Index:   39 LVOT Area:     3.14 cm  RIGHT VENTRICLE             IVC RV Basal diam:  2.40 cm     IVC diam: 1.70 cm RV S prime:     11.30 cm/s TAPSE (M-mode): 2.1 cm LEFT ATRIUM             Index       RIGHT ATRIUM           Index LA diam:        3.50 cm 1.77 cm/m  RA Area:     12.60 cm LA Vol (A2C):   55.7 ml 28.16 ml/m RA Volume:   27.40 ml  13.85 ml/m LA Vol (A4C):   49.4 ml 24.97 ml/m LA Biplane Vol: 55.4 ml 28.01 ml/m  AORTIC VALVE AV Area (Vmax):    1.71 cm AV Area (Vmean):   1.67 cm AV Area (VTI):     1.79 cm AV Vmax:           170.33 cm/s AV Vmean:          126.667 cm/s AV VTI:            0.435 m AV Peak Grad:      11.6 mmHg AV Mean Grad:      7.0 mmHg LVOT Vmax:         92.70 cm/s LVOT Vmean:        67.300 cm/s LVOT VTI:          0.248 m LVOT/AV VTI ratio: 0.57  AORTA Ao Root diam: 3.00 cm Ao Asc diam:  3.10 cm MITRAL VALVE                TRICUSPID VALVE MV Area (PHT): 2.54 cm     TR Peak grad:   25.8 mmHg MV Peak grad:  4.2 mmHg     TR Vmax:        254.00 cm/s MV Mean grad:  2.0 mmHg MV Vmax:       1.03 m/s     SHUNTS MV Vmean:      62.6 cm/s    Systemic VTI:  0.25 m MV Decel Time: 299 msec     Systemic Diam: 2.00 cm MV E velocity: 109.00 cm/s MV A velocity: 90.20 cm/s MV E/A ratio:  1.21 Buford Dresser MD Electronically signed by Buford Dresser MD Signature Date/Time: 08/31/2020/4:00:00 PM    Final  Labs: BNP (last 3 results) Recent Labs    08/31/20 0502  BNP 448.1*   Basic Metabolic Panel: Recent Labs  Lab 08/31/20 0502 08/31/20 1403 09/01/20 0356  NA 138  --  141  K 4.6  --   3.8  CL 103  --  106  CO2 21*  --  25  GLUCOSE 241*  --  113*  BUN 9  --  8  CREATININE 1.55*  --  1.16*  CALCIUM 8.8*  --  8.6*  MG  --  2.0  --    Liver Function Tests: Recent Labs  Lab 08/31/20 0502 09/01/20 0356  AST 27 18  ALT 19 16  ALKPHOS 99 96  BILITOT 0.5 0.3  PROT 6.7 6.5  ALBUMIN 3.1* 3.1*   No results for input(s): LIPASE, AMYLASE in the last 168 hours. No results for input(s): AMMONIA in the last 168 hours. CBC: Recent Labs  Lab 08/31/20 0502 09/01/20 0356  WBC 6.1 6.7  NEUTROABS 3.9  --   HGB 8.4* 8.2*  HCT 29.9* 28.0*  MCV 79.7* 76.9*  PLT 305 254   Cardiac Enzymes: No results for input(s): CKTOTAL, CKMB, CKMBINDEX, TROPONINI in the last 168 hours. BNP: Invalid input(s): POCBNP CBG: Recent Labs  Lab 08/31/20 1610 08/31/20 2125 09/01/20 0828 09/01/20 1112  GLUCAP 127* 161* 144* 203*   D-Dimer No results for input(s): DDIMER in the last 72 hours. Hgb A1c Recent Labs    08/31/20 1403  HGBA1C 7.3*   Lipid Profile No results for input(s): CHOL, HDL, LDLCALC, TRIG, CHOLHDL, LDLDIRECT in the last 72 hours. Thyroid function studies Recent Labs    08/31/20 1403  TSH 31.547*  T3FREE 1.4*   Anemia work up Recent Labs    08/31/20 1403  VITAMINB12 359  FOLATE 26.1  FERRITIN 7*  TIBC 487*  IRON 39  RETICCTPCT 1.6   Urinalysis    Component Value Date/Time   COLORURINE YELLOW 08/31/2020 1448   APPEARANCEUR HAZY (A) 08/31/2020 1448   LABSPEC 1.012 08/31/2020 1448   PHURINE 5.0 08/31/2020 1448   GLUCOSEU NEGATIVE 08/31/2020 1448   HGBUR SMALL (A) 08/31/2020 1448   BILIRUBINUR NEGATIVE 08/31/2020 1448   KETONESUR NEGATIVE 08/31/2020 1448   PROTEINUR 30 (A) 08/31/2020 1448   UROBILINOGEN 1.0 09/05/2015 1440   NITRITE NEGATIVE 08/31/2020 1448   LEUKOCYTESUR TRACE (A) 08/31/2020 1448   Sepsis Labs Invalid input(s): PROCALCITONIN,  WBC,  LACTICIDVEN Microbiology Recent Results (from the past 240 hour(s))  SARS Coronavirus 2 by  RT PCR (hospital order, performed in [facility] hospital lab) Nasopharyngeal Nasopharyngeal Swab     Status: None   Collection Time: 08/31/20  6:37 AM   Specimen: Nasopharyngeal Swab  Result Value Ref Range Status   SARS Coronavirus 2 NEGATIVE NEGATIVE Final    Comment: (NOTE) SARS-CoV-2 target nucleic acids are NOT DETECTED.  The SARS-CoV-2 RNA is generally detectable in upper and lower respiratory specimens during the acute phase of infection. The lowest concentration of SARS-CoV-2 viral copies this assay can detect is 250 copies / mL. A negative result does not preclude SARS-CoV-2 infection and should not be used as the sole basis for treatment or other patient management decisions.  A negative result may occur with improper specimen collection / handling, submission of specimen other than nasopharyngeal swab, presence of viral mutation(s) within the areas targeted by this assay, and inadequate number of viral copies (<250 copies / mL). A negative result must be combined with clinical observations, patient history, and  epidemiological information.  Fact Sheet for Patients:   StrictlyIdeas.no  Fact Sheet for Healthcare Providers: BankingDealers.co.za  This test is not yet approved or  cleared by the [place] FDA and has been authorized for detection and/or diagnosis of SARS-CoV-2 by FDA under an Emergency Use Authorization (EUA).  This EUA will remain in effect (meaning this test can be used) for the duration of the COVID-19 declaration under Section 564(b)(1) of the Act, 21 U.S.C. section 360bbb-3(b)(1), unless the authorization is terminated or revoked sooner.  Performed at [facility], [place] [address] [place], [place] [address]      Time coordinating discharge: 25 minutes  SIGNED: Antonieta Pert, MD  [facility] 09/01/2020, 1:09 PM  If 7PM-7AM, please contact night-coverage [email or web address]

## 2020-09-01 NOTE — Care Management CC44 (Signed)
Condition Code 44 Documentation Completed  Patient Details  Name: Rhonda Santos MRN: [id number] Date of Birth: 1946/12/19   Condition Code 44 given:  Yes Patient signature on Condition Code 44 notice:  Yes Documentation of 2 MD's agreement:  Yes Code 44 added to claim:  Yes    Bethena Roys, RN 09/01/2020, 1:40 PM

## 2020-09-01 NOTE — Evaluation (Signed)
Physical Therapy Evaluation Patient Details Name: Rhonda Santos MRN: [id number] DOB: 15-Jun-1946 Today's Date: 09/01/2020   History of Present Illness  74 y.o. female with medical history significant of hypertension, hyperlipidemia, type 2 diabetes mellitus, hypothyroidism, cirrhosis secondary to Karlene Lineman, CKD stage III, anxiety/depression, morbid obesity, tobacco abuse, COPD(on PRN oxygen at home), GERD, chronic diastolic CHF presents to emergency department for evaluation of near syncope. Pt found to be bradycardic (in 20s) and low BP 80s in the field. Vitals improved in ED. Admitted 08/31/20 for treatment of bradycardia and acute on chronic CKD  Clinical Impression  PTA pt living with daughter in single story home with 2 steps to enter. Pt reports independence with short distance mobility occasionally using walker. Pt reports independence in ADLs and daughter assists with iADLs. Pt is limited in safe mobility by bilateral knee pain in addition to decreased strength and endurance. Pt is currently supervision for transfers and min guard for ambulation with RW. PT recommending Rollator and HHPT at discharge to improve safe mobility in her home environment. PT will continue to follow acutely.     Follow Up Recommendations Home health PT;Supervision for mobility/OOB    Equipment Recommendations  Other (comment) (Rollator )    Recommendations for Other Services       Precautions / Restrictions Precautions Precautions: Fall Restrictions Weight Bearing Restrictions: No      Mobility  Bed Mobility Overal bed mobility: Modified Independent             General bed mobility comments: OOB in recliner on entry   Transfers Overall transfer level: Needs assistance Equipment used: Rolling walker (2 wheeled) Transfers: Sit to/from [facility] Sit to Stand: Supervision Stand pivot transfers: Supervision       General transfer comment: supervision for safety, vc for hand  placement for power up and descent into chair/BSC  Ambulation/Gait Ambulation/Gait assistance: Min guard Gait Distance (Feet): 25 Feet Assistive device: Rolling walker (2 wheeled) Gait Pattern/deviations: Step-through pattern;Decreased step length - right;Decreased step length - left;Trunk flexed;Shuffle Gait velocity: slowed Gait velocity interpretation: <1.31 ft/sec, indicative of household ambulator General Gait Details: min guard for safety and line management, slow, shuffling gait, limited in distance by increasing B knee pain         Balance Overall balance assessment: Mild deficits observed, not formally tested                                           Pertinent Vitals/Pain Pain Assessment: 0-10 Pain Score: 5  Pain Location: bilateral knees Pain Descriptors / Indicators: Aching;Sore Pain Intervention(s): Limited activity within patient's tolerance;Monitored during session;Repositioned    Home Living Family/patient expects to be discharged to:: Private residence Living Arrangements: Children Available Help at Discharge: Family Type of Home: House       Home Layout: One level Home Equipment: Clinical cytogeneticist - 4 wheels;Hand held shower head;Wheelchair - Education administrator (comment) (lift chair)      Prior Function Level of Independence: Needs assistance   Gait / Transfers Assistance Needed: Mod I  ADL's / Homemaking Assistance Needed: Mod I ADL.  Daughter assists with community mobility, home management, meal prep.  Comments: 7XAJ     Hand Dominance   Dominant Hand: Left    Extremity/Trunk Assessment   Upper Extremity Assessment Upper Extremity Assessment: Defer to OT evaluation    Lower Extremity Assessment Lower Extremity Assessment: RLE  deficits/detail;LLE deficits/detail RLE Deficits / Details: knees lacking full ext, generalized weakness  LLE Deficits / Details: knees lacking full ext, generalized weakness        Communication    Communication: No difficulties  Cognition Arousal/Alertness: Awake/alert Behavior During Therapy: WFL for tasks assessed/performed Overall Cognitive Status: Within Functional Limits for tasks assessed                                        General Comments General comments (skin integrity, edema, etc.): HR 67-98 bpm, BP WFL        Assessment/Plan    PT Assessment Patient needs continued PT services  PT Problem List Decreased range of motion;Decreased strength;Decreased activity tolerance;Decreased mobility;Pain       PT Treatment Interventions DME instruction;Gait training;Stair training;Functional mobility training;Therapeutic activities;Therapeutic exercise;Balance training;Cognitive remediation;Patient/family education    PT Goals (Current goals can be found in the Care Plan section)  Acute Rehab PT Goals Patient Stated Goal: I'm going home today.  I'll think about the pacemaker. PT Goal Formulation: With patient Time For Goal Achievement: 09/15/20 Potential to Achieve Goals: Good    Frequency Min 3X/week    AM-PAC PT "6 Clicks" Mobility  Outcome Measure Help needed turning from your back to your side while in a flat bed without using bedrails?: None Help needed moving from lying on your back to sitting on the side of a flat bed without using bedrails?: A Little Help needed moving to and from a bed to a chair (including a wheelchair)?: None Help needed standing up from a chair using your arms (e.g., wheelchair or bedside chair)?: None Help needed to walk in hospital room?: None Help needed climbing 3-5 steps with a railing? : A Little 6 Click Score: 22    End of Session Equipment Utilized During Treatment: Gait belt Activity Tolerance: Patient limited by pain (bilateral knees) Patient left: in chair;with call bell/phone within reach Nurse Communication: Mobility status PT Visit Diagnosis: Other abnormalities of gait and mobility (R26.89);Muscle  weakness (generalized) (M62.81)    Time: 5701-7793 PT Time Calculation (min) (ACUTE ONLY): 34 min   Charges:   PT Evaluation $PT Eval Moderate Complexity: 1 Mod PT Treatments $Gait Training: 8-22 mins        Aidynn Krenn B. Migdalia Dk PT, DPT Acute Rehabilitation Services Pager [phone] Office [phone]   [place] 09/01/2020, 9:58 AM

## 2020-09-01 NOTE — TOC Initial Note (Signed)
Transition of Care [facility]) - Initial/Assessment Note    Patient Details  Name: Rhonda Santos MRN: [id number] Date of Birth: 27-Nov-1946  Transition of [facility]) CM/SW Contact:    Bethena Roys, RN Phone Number: 09/01/2020, 11:54 AM  Clinical Narrative:  Pt presented for near syncopal episode. Prior to arrival patient was from home with her daughter's support. Per patient, her daughter takes her to appointments and picks up medications at [facility]. Case Manager offered choice for [place]. Medicare.gov list provided to patient and she was agreeable to [facility] for [facility]. Referral made to [facility] with [facility] and start of care to begin within 24-48 hours post transition home. Case Manager discussed durable medical equipment (DME) with the patient and she is agreeable to have [employer] deliver the DME to the room prior to transition home. Referral sent to [place].  Daughter to provide transportation home. No further needs from Case Manager at this time.            Expected Discharge Plan: [place] Barriers to Discharge: No Barriers Identified   Patient Goals and CMS Choice Patient states their goals for this hospitalization and ongoing recovery are:: to return to home. with home health services CMS Medicare.gov Compare Post Acute Care list provided to:: Patient Choice offered to / list presented to : Patient  Expected Discharge Plan and Services Expected Discharge Plan: [place] In-house Referral: NA Discharge Planning Services: CM Consult Post Acute Care Choice: [place] arrangements for the past 2 months: Single Family Home                 DME Arranged: Walker rolling with seat, Bedside commode, Shower stool DME Agency: [employer] Date DME Agency Contacted: 09/01/20 Time DME Agency Contacted: [id number] Representative spoke with at DME Agency: Freda Munro HH Arranged: PT, Nurse's Aide [place] Agency:  [place] Date [place]: 09/01/20 Time [place]: 65 Representative spoke with at [place]: Tommi Rumps  Prior Living Arrangements/Services Living arrangements for the past 2 months: [place] Lives with:: Adult Children Patient language and need for interpreter reviewed:: Yes Do you feel safe going back to the place where you live?: Yes      Need for Family Participation in Patient Care: Yes (Comment) Care giver support system in place?: Yes (comment)   Criminal Activity/Legal Involvement Pertinent to Current Situation/Hospitalization: No - Comment as needed  Activities of Daily Living Home Assistive Devices/Equipment: Walker (specify type) ADL Screening (condition at time of admission) Patient's cognitive ability adequate to safely complete daily activities?: Yes Is the patient deaf or have difficulty hearing?: No Does the patient have difficulty seeing, even when wearing glasses/contacts?: No Does the patient have difficulty concentrating, remembering, or making decisions?: Yes Patient able to express need for assistance with ADLs?: Yes Does the patient have difficulty dressing or bathing?: No Independently performs ADLs?: Yes (appropriate for developmental age) Does the patient have difficulty walking or climbing stairs?: Yes Weakness of Legs: Both Weakness of Arms/Hands: None  Permission Sought/Granted Permission sought to share information with : Family Supports, Case Freight forwarder, Chartered certified accountant granted to share information with : Yes, Verbal Permission Granted     Permission granted to share info w AGENCY: [facility]        Emotional Assessment Appearance:: Appears stated age Attitude/Demeanor/Rapport: Engaged Affect (typically observed): Appropriate Orientation: : Oriented to Self, Oriented to Place, Oriented to  Time, Oriented  to Situation Alcohol / Substance Use: Not Applicable Psych Involvement: No  (comment)  Admission diagnosis:  Pre-syncope [R55] Near syncope [R55] Patient Active Problem List   Diagnosis Date Noted  . Near syncope 08/31/2020  . Bradycardia 08/31/2020  . Iron deficiency anemia   . Microcytic anemia 09/24/2016  . Abdominal pain   . COPD exacerbation ([place]) 12/25/2015  . Acute respiratory failure with hypoxia ([place]) 12/25/2015  . History of pulmonary embolus (PE) 12/25/2015  . Chronic anticoagulation 12/25/2015  . Hypothyroidism 12/25/2015  . HTN (hypertension) 12/25/2015  . GERD (gastroesophageal reflux disease) 12/25/2015  . Acute abdominal pain   . 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 Sepsis with hypotension ([place]) 09/05/2015  . Elevated lactic acid level 09/05/2015  . Abdominal pain, acute 09/05/2015  . Acute on chronic kidney failure ([place]) 09/05/2015  . History of pulmonary embolism/ July 23, 2015 09/05/2015  . Anemia 09/05/2015  . Blood poisoning   . 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 Diabetes mellitus without complication ([place]) 37/34/2876  . Prolonged Q-T interval on ECG 10/07/2013  . Metabolic alkalosis 81/15/7262  . Tobacco abuse 10/07/2013  . Transaminitis 10/07/2013  . Left ventricular diastolic dysfunction, NYHA class 2 10/07/2013  . Hypokalemia 10/06/2013  . Weakness 10/06/2013  . Altered mental status 10/06/2013   PCP:  Vidal Schwalbe, MD Pharmacy:   [place], [address] [place] [address] Phone: [phone] Fax: [phone]  Readmission Risk Interventions No flowsheet data found.

## 2020-09-29 ENCOUNTER — Other Ambulatory Visit: Payer: Self-pay

## 2020-09-29 ENCOUNTER — Encounter: Payer: Self-pay | Admitting: Internal Medicine

## 2020-09-29 ENCOUNTER — Ambulatory Visit: Payer: Medicare PPO | Admitting: Internal Medicine

## 2020-09-29 VITALS — BP 150/68 | HR 65 | Ht 65.0 in | Wt 190.8 lb

## 2020-09-29 DIAGNOSIS — R001 Bradycardia, unspecified: Secondary | ICD-10-CM

## 2020-09-29 DIAGNOSIS — I10 Essential (primary) hypertension: Secondary | ICD-10-CM | POA: Diagnosis not present

## 2020-09-29 DIAGNOSIS — R0683 Snoring: Secondary | ICD-10-CM

## 2020-09-29 NOTE — Progress Notes (Signed)
PCP: Vidal Schwalbe, MD   Primary EP: Dr Pia Mau Gibbons is a 74 y.o. female who presents today for routine electrophysiology followup.  Since her hospital discharge, the patient reports doing very well.  No symptoms of fatigue or weakness.  Today, she denies symptoms of palpitations, chest pain, shortness of breath,  lower extremity edema, dizziness, presyncope, or syncope.  The patient is otherwise without complaint today.   Past Medical History:  Diagnosis Date  . Anemia 09/2016  . Cirrhosis ([place]) 08/2015   Fatty liver seen on imaging of 2014.  Lifetime teetotaler.  . DDD (degenerative disc disease)    With chronic pain  . Diabetes mellitus without complication ([place])   . Diastolic dysfunction 25/3664  . Gallstone 2014   Incidental finding, no complications.  . Hyperlipidemia   . Hypertension   . Hypokalemia 10/06/2013   Secondary to lasix  . Hypoxia 10/07/2013   From mild resp depression from opioids  . Mitral valve prolapse 09/2013   Mild per echo  . Opioid intoxication delirium ([place]) 2015   Unintentional overdose with acute psychoses  . Osteoporosis   . Prolonged Q-T interval on ECG 10/07/2013   Secondary to hypokalemia  . Pulmonary embolism ([place]) 2016   Started on Xarelto  . Thyroid disease    Initially hyperthyroid, question Graves' disease, underwent thyroidectomy and subsequently hypothyroid  . Tobacco abuse 10/07/2013   Past Surgical History:  Procedure Laterality Date  . COLONOSCOPY N/A 09/26/2016   Procedure: COLONOSCOPY;  Surgeon: Mauri Pole, MD;  Location: [facility];  Service: Endoscopy;  Laterality: N/A;  . COLONOSCOPY N/A 09/27/2016   Procedure: COLONOSCOPY;  Surgeon: Mauri Pole, MD;  Location: [facility];  Service: Endoscopy;  Laterality: N/A;  . ESOPHAGOGASTRODUODENOSCOPY N/A 09/26/2016   Procedure: ESOPHAGOGASTRODUODENOSCOPY (EGD);  Surgeon: Mauri Pole, MD;  Location: [facility] END[facility]COPY;  Service: Endoscopy;  Laterality:  N/A;  . THYROID SURGERY      ROS- all systems are reviewed and negatives except as per HPI above  Current Outpatient Medications  Medication Sig Dispense Refill  . albuterol (PROVENTIL HFA;VENTOLIN HFA) 108 (90 Base) MCG/ACT inhaler Take 2 puffs 3 times daily for 5 more days and then every 6 hours as needed thereafter. 1 Inhaler 2  . albuterol (PROVENTIL) (2.5 MG/3ML) 0.083% nebulizer solution Take 2.5 mg by nebulization every 4 (four) hours as needed for wheezing or shortness of breath.     Marland Kitchen atorvastatin (LIPITOR) 40 MG tablet Take 40 mg by mouth daily.    . clonazePAM (KLONOPIN) 0.5 MG tablet Take 0.5 mg by mouth daily.    . diclofenac sodium (VOLTAREN) 1 % GEL Apply 1 application topically 3 (three) times daily as needed (pain).     . DULoxetine (CYMBALTA) 60 MG capsule Take 60 mg by mouth daily before lunch.     . ferrous sulfate 325 (65 FE) MG tablet Take 1 tablet (325 mg total) by mouth 3 (three) times daily with meals. 90 tablet 0  . furosemide (LASIX) 40 MG tablet Take 40 mg by mouth daily as needed for fluid.     Marland Kitchen gabapentin (NEURONTIN) 600 MG tablet Take 600 mg by mouth 3 (three) times daily as needed (pain).     Marland Kitchen glimepiride (AMARYL) 1 MG tablet Take 1 mg by mouth daily with breakfast.    . levothyroxine (SYNTHROID) 100 MCG tablet Take 1 tablet (100 mcg total) by mouth daily before breakfast. 30 tablet 0  . losartan (COZAAR) 50 MG  tablet Take 50 mg by mouth daily.    . metFORMIN (GLUCOPHAGE) 500 MG tablet Take 500 mg by mouth 2 (two) times daily.    . metoprolol tartrate (LOPRESSOR) 25 MG tablet Take 25 mg by mouth 2 (two) times daily.    Marland Kitchen omeprazole (PRILOSEC) 40 MG capsule Take 40 mg by mouth daily as needed (heartburn/ acid reflux).     . potassium chloride (K-DUR) 10 MEQ tablet Take 10 mEq by mouth 2 (two) times daily.     . Tiotropium Bromide-Olodaterol (STIOLTO RESPIMAT) 2.5-2.5 MCG/ACT AERS Inhale 1 puff into the lungs daily as needed (shortness of breath).     .  traMADol (ULTRAM) 50 MG tablet Take 50 mg by mouth every 6 (six) hours as needed for moderate pain (pain).     . Vitamin D, Ergocalciferol, (DRISDOL) 50000 UNITS CAPS capsule Take 50,000 Units by mouth every Monday.      No current facility-administered medications for this visit.    Physical Exam: Vitals:   09/29/20 1642  BP: (!) 150/68  Pulse: 65  SpO2: 92%  Weight: 190 lb 12.8 oz (86.5 kg)  Height: 5\' 5"  (1.651 m)    GEN- The patient is elderly and chronically ill appearing, alert and oriented x 3 today.   Smells of tobacco,  In a wheelchair today Head- normocephalic, atraumatic Eyes-  Sclera clear, conjunctiva pink Ears- hearing intact Oropharynx- clear Lungs-   normal work of breathing Heart- Regular rate and rhythm  GI- soft,  Extremities- no clubbing, cyanosis, or edema  Wt Readings from Last 3 Encounters:  09/29/20 190 lb 12.8 oz (86.5 kg)  09/01/20 197 lb 12 oz (89.7 kg)  08/27/19 200 lb (90.7 kg)    EKG tracing ordered today is personally reviewed and shows sinus with PR 224 msec, LBBB with QRS 146 msec  Assessment and Plan:  1. Bradycardia Recently with second degree AV block and weakness. She has advanced conduction system disease by ekg.  She has recently declined pacing. At this time, I have reinforced the importance of stopping metoprolol 14 day ZIo.  If any additional AV block or symptoms of bradycardia, we will plan to proceed with PPM implantation.  2. Chronic diastolic dysfunction Stable No change required today  3. HTN Stable No change required today  4. Snoring Sleep study is ordered today  5. Tobacco Cessation advised   Risks, benefits and potential toxicities for medications prescribed and/or refilled reviewed with patient today.   Return in 4 weeks for follow-up  Thompson Grayer MD, [facility] 09/29/2020 4:59 PM

## 2020-09-29 NOTE — Patient Instructions (Addendum)
Medication Instructions:  Stop metoprolol    *If you need a refill on your cardiac medications before your next appointment, please call your pharmacy*  Lab Work: None ordered.  If you have labs (blood work) drawn today and your tests are completely normal, you will receive your results only by: Marland Kitchen MyChart Message (if you have MyChart) OR . A paper copy in the mail If you have any lab test that is abnormal or we need to change your treatment, we will call you to review the results.  Testing/Procedures: Your physician has recommended that you wear a Heart monitor. Heart monitors are medical devices that record the heart's electrical activity. Doctors most often use these monitors to diagnose arrhythmias. Arrhythmias are problems with the speed or rhythm of the heartbeat. The monitor is a small, portable device. You can wear one while you do your normal daily activities. This is usually used to diagnose what is causing palpitations/syncope (passing out).   Follow-Up: At [facility], you and your health needs are our priority.  As part of our continuing mission to provide you with exceptional heart care, we have created designated Provider Care Teams.  These Care Teams include your primary Cardiologist (physician) and Advanced Practice Providers (APPs -  Physician Assistants and Nurse Practitioners) who all work together to provide you with the care you need, when you need it.  We recommend signing up for the patient portal called "MyChart".  Sign up information is provided on this After Visit Summary.  MyChart is used to connect with patients for Virtual Visits (Telemedicine).  Patients are able to view lab/test results, encounter notes, upcoming appointments, etc.  Non-urgent messages can be sent to your provider as well.   To learn more about what you can do with MyChart, go to [email or web address].    Your next appointment:   Your physician wants you to follow-up in: 10/30/21 at 11:45  am with Dr. Rayann Heman.   Other Instructions: ZIO XT- Long Term Monitor Instructions   Your physician has requested you wear your ZIO patch monitor____14___days.   This is a single patch monitor.  [employer] supplies one patch monitor per enrollment.  Additional stickers are not available.   Please do not apply patch if you will be having a Nuclear Stress Test, Echocardiogram, Cardiac CT, MRI, or Chest Xray during the time frame you would be wearing the monitor. The patch cannot be worn during these tests.  You cannot remove and re-apply the ZIO XT patch monitor.   Your ZIO patch monitor will be sent USPS Priority mail from [facility] directly to your home address. The monitor may also be mailed to a PO BOX if home delivery is not available.   It may take 3-5 days to receive your monitor after you have been enrolled.   Once you have received you monitor, please review enclosed instructions.  Your monitor has already been registered assigning a specific monitor serial # to you.   Applying the monitor   Shave hair from upper left chest.   Hold abrader disc by orange tab.  Rub abrader in 40 strokes over left upper chest as indicated in your monitor instructions.   Clean area with 4 enclosed alcohol pads .  Use all pads to assure are is cleaned thoroughly.  Let dry.   Apply patch as indicated in monitor instructions.  Patch will be place under collarbone on left side of chest with arrow pointing upward.   Rub patch adhesive wings for 2  minutes.Remove white label marked "1".  Remove white label marked "2".  Rub patch adhesive wings for 2 additional minutes.   While looking in a mirror, press and release button in center of patch.  A small green light will flash 3-4 times .  This will be your only indicator the monitor has been turned on.     Do not shower for the first 24 hours.  You may shower after the first 24 hours.   Press button if you feel a symptom. You will hear a small click.   Record Date, Time and Symptom in the Patient Log Book.   When you are ready to remove patch, follow instructions on last 2 pages of Patient Log Book.  Stick patch monitor onto last page of Patient Log Book.   Place Patient Log Book in [place] box.  Use locking tab on box and tape box closed securely.  The [employer] has [employer] on it.  Please place in mailbox as soon as possible.  Your physician should have your test results approximately 7 days after the monitor has been mailed back to [facility].   Call [place] at [phone] if you have questions regarding your ZIO XT patch monitor.  Call them immediately if you see an orange light blinking on your monitor.   If your monitor falls off in less than 4 days contact our Monitor department at [phone].  If your monitor becomes loose or falls off after 4 days call [employer] at [phone] for suggestions on securing your monitor.

## 2020-10-02 ENCOUNTER — Telehealth: Payer: Self-pay | Admitting: *Deleted

## 2020-10-02 ENCOUNTER — Encounter: Payer: Self-pay | Admitting: *Deleted

## 2020-10-02 NOTE — Progress Notes (Signed)
Patient ID: Rhonda Santos, female   DOB: 1946/10/27, 74 y.o.   MRN: [id number] Patient enrolled for [employer] to ship a 14 day ZIO XT monitor to her home.

## 2020-10-02 NOTE — Telephone Encounter (Signed)
Staff message sent to Gae Bon ok to schedule HST. [employer] does not require a PA for HST.

## 2020-10-05 ENCOUNTER — Other Ambulatory Visit (INDEPENDENT_AMBULATORY_CARE_PROVIDER_SITE_OTHER): Payer: Medicare PPO

## 2020-10-05 DIAGNOSIS — R0683 Snoring: Secondary | ICD-10-CM | POA: Diagnosis not present

## 2020-10-05 DIAGNOSIS — R001 Bradycardia, unspecified: Secondary | ICD-10-CM

## 2020-10-05 DIAGNOSIS — I10 Essential (primary) hypertension: Secondary | ICD-10-CM

## 2020-10-30 ENCOUNTER — Ambulatory Visit: Payer: Medicare PPO | Admitting: Internal Medicine

## 2021-01-17 ENCOUNTER — Encounter: Payer: Self-pay | Admitting: *Deleted

## 2021-06-11 ENCOUNTER — Inpatient Hospital Stay (HOSPITAL_COMMUNITY)
Admission: EM | Admit: 2021-06-11 | Discharge: 2021-06-14 | DRG: 981 | Disposition: A | Discharge status: Home / Self Care | Payer: Medicare PPO | Attending: Internal Medicine | Admitting: Internal Medicine

## 2021-06-11 ENCOUNTER — Encounter (HOSPITAL_COMMUNITY): Payer: Self-pay | Admitting: Emergency Medicine

## 2021-06-11 ENCOUNTER — Emergency Department (HOSPITAL_COMMUNITY): Payer: Medicare PPO

## 2021-06-11 DIAGNOSIS — Z87891 Personal history of nicotine dependence: Secondary | ICD-10-CM

## 2021-06-11 DIAGNOSIS — J440 Chronic obstructive pulmonary disease with acute lower respiratory infection: Secondary | ICD-10-CM | POA: Diagnosis present

## 2021-06-11 DIAGNOSIS — Z86711 Personal history of pulmonary embolism: Secondary | ICD-10-CM | POA: Diagnosis not present

## 2021-06-11 DIAGNOSIS — Z79890 Hormone replacement therapy (postmenopausal): Secondary | ICD-10-CM

## 2021-06-11 DIAGNOSIS — R059 Cough, unspecified: Secondary | ICD-10-CM

## 2021-06-11 DIAGNOSIS — J9601 Acute respiratory failure with hypoxia: Secondary | ICD-10-CM | POA: Diagnosis present

## 2021-06-11 DIAGNOSIS — Z79899 Other long term (current) drug therapy: Secondary | ICD-10-CM

## 2021-06-11 DIAGNOSIS — E1122 Type 2 diabetes mellitus with diabetic chronic kidney disease: Secondary | ICD-10-CM | POA: Diagnosis present

## 2021-06-11 DIAGNOSIS — I5033 Acute on chronic diastolic (congestive) heart failure: Secondary | ICD-10-CM | POA: Diagnosis present

## 2021-06-11 DIAGNOSIS — E785 Hyperlipidemia, unspecified: Secondary | ICD-10-CM | POA: Diagnosis present

## 2021-06-11 DIAGNOSIS — D509 Iron deficiency anemia, unspecified: Secondary | ICD-10-CM | POA: Diagnosis present

## 2021-06-11 DIAGNOSIS — J1282 Pneumonia due to coronavirus disease 2019: Secondary | ICD-10-CM | POA: Diagnosis present

## 2021-06-11 DIAGNOSIS — K760 Fatty (change of) liver, not elsewhere classified: Secondary | ICD-10-CM | POA: Diagnosis present

## 2021-06-11 DIAGNOSIS — F32A Depression, unspecified: Secondary | ICD-10-CM | POA: Diagnosis present

## 2021-06-11 DIAGNOSIS — U071 COVID-19: Principal | ICD-10-CM | POA: Diagnosis present

## 2021-06-11 DIAGNOSIS — K7460 Unspecified cirrhosis of liver: Secondary | ICD-10-CM | POA: Diagnosis present

## 2021-06-11 DIAGNOSIS — Z66 Do not resuscitate: Secondary | ICD-10-CM | POA: Diagnosis present

## 2021-06-11 DIAGNOSIS — I440 Atrioventricular block, first degree: Secondary | ICD-10-CM | POA: Diagnosis present

## 2021-06-11 DIAGNOSIS — Z7984 Long term (current) use of oral hypoglycemic drugs: Secondary | ICD-10-CM | POA: Diagnosis not present

## 2021-06-11 DIAGNOSIS — Z959 Presence of cardiac and vascular implant and graft, unspecified: Secondary | ICD-10-CM

## 2021-06-11 DIAGNOSIS — E890 Postprocedural hypothyroidism: Secondary | ICD-10-CM | POA: Diagnosis present

## 2021-06-11 DIAGNOSIS — I441 Atrioventricular block, second degree: Secondary | ICD-10-CM | POA: Diagnosis not present

## 2021-06-11 DIAGNOSIS — I959 Hypotension, unspecified: Secondary | ICD-10-CM | POA: Diagnosis not present

## 2021-06-11 DIAGNOSIS — I130 Hypertensive heart and chronic kidney disease with heart failure and stage 1 through stage 4 chronic kidney disease, or unspecified chronic kidney disease: Secondary | ICD-10-CM | POA: Diagnosis present

## 2021-06-11 DIAGNOSIS — G894 Chronic pain syndrome: Secondary | ICD-10-CM | POA: Diagnosis present

## 2021-06-11 DIAGNOSIS — R0602 Shortness of breath: Secondary | ICD-10-CM | POA: Diagnosis present

## 2021-06-11 DIAGNOSIS — I447 Left bundle-branch block, unspecified: Secondary | ICD-10-CM | POA: Diagnosis present

## 2021-06-11 DIAGNOSIS — R682 Dry mouth, unspecified: Secondary | ICD-10-CM | POA: Diagnosis not present

## 2021-06-11 DIAGNOSIS — R001 Bradycardia, unspecified: Secondary | ICD-10-CM | POA: Diagnosis present

## 2021-06-11 DIAGNOSIS — F419 Anxiety disorder, unspecified: Secondary | ICD-10-CM | POA: Diagnosis present

## 2021-06-11 DIAGNOSIS — N1832 Chronic kidney disease, stage 3b: Secondary | ICD-10-CM | POA: Diagnosis present

## 2021-06-11 LAB — CBC WITH DIFFERENTIAL/PLATELET
Abs Immature Granulocytes: 0.03 10*3/uL (ref 0.00–0.07)
Basophils Absolute: 0.1 10*3/uL (ref 0.0–0.1)
Basophils Relative: 1 %
Eosinophils Absolute: 0.2 10*3/uL (ref 0.0–0.5)
Eosinophils Relative: 2 %
HCT: 32.7 % — ABNORMAL LOW (ref 36.0–46.0)
Hemoglobin: 10.8 g/dL — ABNORMAL LOW (ref 12.0–15.0)
Immature Granulocytes: 0 %
Lymphocytes Relative: 19 %
Lymphs Abs: 1.8 10*3/uL (ref 0.7–4.0)
MCH: 31.1 pg (ref 26.0–34.0)
MCHC: 33 g/dL (ref 30.0–36.0)
MCV: 94.2 fL (ref 80.0–100.0)
Monocytes Absolute: 0.9 10*3/uL (ref 0.1–1.0)
Monocytes Relative: 10 %
Neutro Abs: 6.5 10*3/uL (ref 1.7–7.7)
Neutrophils Relative %: 68 %
Platelets: 215 10*3/uL (ref 150–400)
RBC: 3.47 MIL/uL — ABNORMAL LOW (ref 3.87–5.11)
RDW: 14 % (ref 11.5–15.5)
WBC: 9.5 10*3/uL (ref 4.0–10.5)
nRBC: 0 % (ref 0.0–0.2)

## 2021-06-11 LAB — TSH: TSH: 7.669 u[IU]/mL — ABNORMAL HIGH (ref 0.350–4.500)

## 2021-06-11 LAB — COMPREHENSIVE METABOLIC PANEL
ALT: 17 U/L (ref 0–44)
AST: 23 U/L (ref 15–41)
Albumin: 3.1 g/dL — ABNORMAL LOW (ref 3.5–5.0)
Alkaline Phosphatase: 90 U/L (ref 38–126)
Anion gap: 8 (ref 5–15)
BUN: 23 mg/dL (ref 8–23)
CO2: 27 mmol/L (ref 22–32)
Calcium: 9.2 mg/dL (ref 8.9–10.3)
Chloride: 98 mmol/L (ref 98–111)
Creatinine, Ser: 1.23 mg/dL — ABNORMAL HIGH (ref 0.44–1.00)
GFR, Estimated: 46 mL/min — ABNORMAL LOW (ref >60)
Glucose, Bld: 146 mg/dL — ABNORMAL HIGH (ref 70–99)
Potassium: 4.7 mmol/L (ref 3.5–5.1)
Sodium: 133 mmol/L — ABNORMAL LOW (ref 135–145)
Total Bilirubin: 0.9 mg/dL (ref 0.3–1.2)
Total Protein: 6.8 g/dL (ref 6.5–8.1)

## 2021-06-11 LAB — CBG MONITORING, ED
Glucose-Capillary: 136 mg/dL — ABNORMAL HIGH (ref 70–99)
Glucose-Capillary: 226 mg/dL — ABNORMAL HIGH (ref 70–99)
Glucose-Capillary: 223 mg/dL — ABNORMAL HIGH (ref 70–99)

## 2021-06-11 LAB — D-DIMER, QUANTITATIVE: D-Dimer, Quant: 0.54 ug/mL-FEU — ABNORMAL HIGH (ref 0.00–0.50)

## 2021-06-11 LAB — TROPONIN I (HIGH SENSITIVITY)
Troponin I (High Sensitivity): 26 ng/L — ABNORMAL HIGH (ref <18)
Troponin I (High Sensitivity): 30 ng/L — ABNORMAL HIGH (ref <18)

## 2021-06-11 LAB — URINALYSIS, ROUTINE W REFLEX MICROSCOPIC
Bilirubin Urine: NEGATIVE
Glucose, UA: NEGATIVE mg/dL
Hgb urine dipstick: NEGATIVE
Ketones, ur: NEGATIVE mg/dL
Leukocytes,Ua: NEGATIVE
Nitrite: NEGATIVE
Protein, ur: NEGATIVE mg/dL
Specific Gravity, Urine: 1.009 (ref 1.005–1.030)
pH: 5 (ref 5.0–8.0)

## 2021-06-11 LAB — RESP PANEL BY RT-PCR (FLU A&B, COVID) ARPGX2
Influenza A by PCR: NEGATIVE
Influenza B by PCR: NEGATIVE
SARS Coronavirus 2 by RT PCR: POSITIVE — AB

## 2021-06-11 LAB — C-REACTIVE PROTEIN: CRP: 3.4 mg/dL — ABNORMAL HIGH (ref <1.0)

## 2021-06-11 LAB — T4, FREE: Free T4: 1.05 ng/dL (ref 0.61–1.12)

## 2021-06-11 LAB — FERRITIN: Ferritin: 66 ng/mL (ref 11–307)

## 2021-06-11 LAB — BRAIN NATRIURETIC PEPTIDE: B Natriuretic Peptide: 592.6 pg/mL — ABNORMAL HIGH (ref 0.0–100.0)

## 2021-06-11 MED ORDER — GABAPENTIN 300 MG PO CAPS
600.0000 mg | ORAL_CAPSULE | Freq: Three times a day (TID) | ORAL | Status: DC | PRN
  Administered 2021-06-12 – 2021-06-13 (×2): 600 mg via ORAL
  Filled 2021-06-11 (×2): qty 2

## 2021-06-11 MED ORDER — ALBUTEROL SULFATE HFA 108 (90 BASE) MCG/ACT IN AERS
4.0000 | INHALATION_SPRAY | Freq: Once | RESPIRATORY_TRACT | Status: AC
  Administered 2021-06-11: 4 via RESPIRATORY_TRACT
  Filled 2021-06-11: qty 6.7

## 2021-06-11 MED ORDER — TRAMADOL HCL 50 MG PO TABS
50.0000 mg | ORAL_TABLET | Freq: Four times a day (QID) | ORAL | Status: DC | PRN
  Administered 2021-06-12 – 2021-06-13 (×2): 50 mg via ORAL
  Filled 2021-06-11 (×2): qty 1

## 2021-06-11 MED ORDER — ARFORMOTEROL TARTRATE 15 MCG/2ML IN NEBU: 15.0000 ug | INHALATION_SOLUTION | Freq: Two times a day (BID) | RESPIRATORY_TRACT | Status: DC

## 2021-06-11 MED ORDER — ENOXAPARIN SODIUM 40 MG/0.4ML IJ SOSY
40.0000 mg | PREFILLED_SYRINGE | Freq: Every day | INTRAMUSCULAR | Status: DC
  Administered 2021-06-11: 40 mg via SUBCUTANEOUS
  Filled 2021-06-11: qty 0.4

## 2021-06-11 MED ORDER — ATORVASTATIN CALCIUM 40 MG PO TABS
40.0000 mg | ORAL_TABLET | Freq: Every day | ORAL | Status: DC
  Administered 2021-06-11 – 2021-06-14 (×3): 40 mg via ORAL
  Filled 2021-06-11 (×3): qty 1

## 2021-06-11 MED ORDER — LEVOTHYROXINE SODIUM 100 MCG PO TABS
100.0000 ug | ORAL_TABLET | Freq: Every day | ORAL | Status: DC
  Administered 2021-06-12: 100 ug via ORAL
  Filled 2021-06-11: qty 1

## 2021-06-11 MED ORDER — SODIUM CHLORIDE 0.9 % IV SOLN
100.0000 mg | Freq: Every day | INTRAVENOUS | Status: DC
  Filled 2021-06-11: qty 20

## 2021-06-11 MED ORDER — INSULIN ASPART 100 UNIT/ML IJ SOLN
6.0000 [IU] | Freq: Three times a day (TID) | INTRAMUSCULAR | Status: DC
  Administered 2021-06-11 – 2021-06-14 (×7): 6 [IU] via SUBCUTANEOUS

## 2021-06-11 MED ORDER — UMECLIDINIUM BROMIDE 62.5 MCG/INH IN AEPB
1.0000 | INHALATION_SPRAY | Freq: Every day | RESPIRATORY_TRACT | Status: DC
  Administered 2021-06-12 – 2021-06-14 (×3): 1 via RESPIRATORY_TRACT
  Filled 2021-06-11: qty 7

## 2021-06-11 MED ORDER — PANTOPRAZOLE SODIUM 40 MG PO TBEC
40.0000 mg | DELAYED_RELEASE_TABLET | Freq: Every day | ORAL | Status: DC
  Administered 2021-06-11 – 2021-06-14 (×3): 40 mg via ORAL
  Filled 2021-06-11 (×3): qty 1

## 2021-06-11 MED ORDER — DEXAMETHASONE 4 MG PO TABS
6.0000 mg | ORAL_TABLET | ORAL | Status: DC
  Filled 2021-06-11: qty 2

## 2021-06-11 MED ORDER — UMECLIDINIUM BROMIDE 62.5 MCG/INH IN AEPB: 1.0000 | INHALATION_SPRAY | Freq: Every day | RESPIRATORY_TRACT | Status: DC

## 2021-06-11 MED ORDER — IPRATROPIUM-ALBUTEROL 20-100 MCG/ACT IN AERS
1.0000 | INHALATION_SPRAY | Freq: Four times a day (QID) | RESPIRATORY_TRACT | Status: DC
  Administered 2021-06-11 – 2021-06-14 (×11): 1 via RESPIRATORY_TRACT
  Filled 2021-06-11: qty 4

## 2021-06-11 MED ORDER — SENNA 8.6 MG PO TABS
1.0000 | ORAL_TABLET | Freq: Two times a day (BID) | ORAL | Status: DC
  Administered 2021-06-13 – 2021-06-14 (×2): 8.6 mg via ORAL
  Filled 2021-06-11 (×2): qty 1

## 2021-06-11 MED ORDER — SODIUM CHLORIDE 0.9 % IV SOLN
200.0000 mg | Freq: Once | INTRAVENOUS | Status: AC
  Administered 2021-06-11: 200 mg via INTRAVENOUS
  Filled 2021-06-11: qty 40

## 2021-06-11 MED ORDER — DULOXETINE HCL 60 MG PO CPEP
60.0000 mg | ORAL_CAPSULE | Freq: Every day | ORAL | Status: DC
  Administered 2021-06-11 – 2021-06-14 (×4): 60 mg via ORAL
  Filled 2021-06-11 (×5): qty 1

## 2021-06-11 MED ORDER — IPRATROPIUM-ALBUTEROL 0.5-2.5 (3) MG/3ML IN SOLN: 3.0000 mL | Freq: Four times a day (QID) | RESPIRATORY_TRACT | Status: DC

## 2021-06-11 MED ORDER — GUAIFENESIN-DM 100-10 MG/5ML PO SYRP
10.0000 mL | ORAL_SOLUTION | ORAL | Status: DC | PRN
  Administered 2021-06-13: 10 mL via ORAL
  Filled 2021-06-11: qty 10

## 2021-06-11 MED ORDER — INSULIN ASPART 100 UNIT/ML IJ SOLN
0.0000 [IU] | Freq: Three times a day (TID) | INTRAMUSCULAR | Status: DC
  Administered 2021-06-11 – 2021-06-12 (×2): 7 [IU] via SUBCUTANEOUS
  Administered 2021-06-12: 4 [IU] via SUBCUTANEOUS
  Administered 2021-06-12 – 2021-06-13 (×2): 3 [IU] via SUBCUTANEOUS
  Administered 2021-06-13: 4 [IU] via SUBCUTANEOUS
  Administered 2021-06-13 – 2021-06-14 (×2): 3 [IU] via SUBCUTANEOUS
  Administered 2021-06-14: 4 [IU] via SUBCUTANEOUS

## 2021-06-11 MED ORDER — METHYLPREDNISOLONE SODIUM SUCC 125 MG IJ SOLR
125.0000 mg | Freq: Once | INTRAMUSCULAR | Status: AC
  Administered 2021-06-11: 125 mg via INTRAVENOUS
  Filled 2021-06-11: qty 2

## 2021-06-11 MED ORDER — CLONAZEPAM 0.5 MG PO TABS: 0.5000 mg | ORAL_TABLET | Freq: Every day | ORAL | Status: DC

## 2021-06-11 MED ORDER — IPRATROPIUM-ALBUTEROL 20-100 MCG/ACT IN AERS: 1.0000 | INHALATION_SPRAY | Freq: Four times a day (QID) | RESPIRATORY_TRACT | Status: DC

## 2021-06-11 MED ORDER — LOSARTAN POTASSIUM 50 MG PO TABS
50.0000 mg | ORAL_TABLET | Freq: Every day | ORAL | Status: DC
  Administered 2021-06-11: 50 mg via ORAL
  Filled 2021-06-11: qty 1

## 2021-06-11 MED ORDER — FUROSEMIDE 10 MG/ML IJ SOLN
40.0000 mg | Freq: Once | INTRAMUSCULAR | Status: AC
  Administered 2021-06-11: 40 mg via INTRAVENOUS
  Filled 2021-06-11: qty 4

## 2021-06-11 MED ORDER — CLONAZEPAM 0.5 MG PO TABS
0.5000 mg | ORAL_TABLET | Freq: Every day | ORAL | Status: DC | PRN
  Administered 2021-06-11 – 2021-06-13 (×4): 0.5 mg via ORAL
  Filled 2021-06-11 (×4): qty 1

## 2021-06-11 NOTE — ED Triage Notes (Addendum)
Pt arrived from home via [facility] for cc of Shortness of breath, chest discomfort, and bradycardia. EMS report pt began having shortness of breath, chest discomfort approximately 7 hours prior to calling EMS. EMS found pt to be in a heart block, HR maintaining in the 30s throughout transport. Cough and congestion noted as well. 12 lead at bedside.

## 2021-06-11 NOTE — ED Notes (Signed)
rn spoke to MD Ngyuen informed rn to put pads on pt and start pacing if pts hr is below 20, md also aware of pts blood sugar of 226

## 2021-06-11 NOTE — H&P (Signed)
Date: 06/11/2021               Patient Name:  Rhonda Santos MRN: [id number]  DOB: 1946/09/18 Age / Sex: 75 y.o., female   PCP: Vidal Schwalbe, MD         Medical Service: Internal Medicine Teaching Service         Attending Physician: Dr. Lucious Groves, DO    First Contact: Dr. Coy Saunas Pager: [phone]  Second Contact: Dr. Court Joy Pager: [phone]       After Hours (After 5p/  First Contact Pager: [phone]  weekends / holidays): Second Contact Pager: [phone]   Chief Complaint: Shortness of breath  History of Present Illness:  Rhonda Santos is a 75 yo F w/ PMH of DM, HFpEF, Chronic pain on tramadol, COPD, HTN, Post-surgical hypothyroidism, HLD, HTN presenting to [facility] with complaint of shortness of breath. She was examined and evaluated at bedside in ED. She was in her usual state of health until last night when she developed worsening dyspnea on exertion, non-productive cough, malaise and fatigue without obvious inciting event. She also endorses chest pain described as soreness associated with her coughs. She was unable to sleep at all last night due to these symptoms and eventually called EMS who brought her to [facility] for evaluation.   On review of systems, she denies any fevers, chills, nausea, vomiting, diarrhea, sick contact. She is not vaccinated. She endorse sinus, chest congestion. No productive sputum. Denies light-headedness, blurry vision, headache, focal weakness, dysuria.  Spoke with Rhonda Santos, her daughter, who provides additional history. She mentions that Rhonda Santos rarely leaves her home. She does mentions that she had a doctor's appointment last Monday for sinus congestion and was started on antibiotics for sinus infection. She states possible exposure at that time. She denies any other family members with viral symptoms such as fevers, chills, cough, shortness of breath but mentions that other family members at home had [place] about 4 months prior, although Rhonda Santos never caught  it.  Meds: No current facility-administered medications on file prior to encounter.   Current Outpatient Medications on File Prior to Encounter  Medication Sig Dispense Refill   albuterol (PROVENTIL HFA;VENTOLIN HFA) 108 (90 Base) MCG/ACT inhaler Take 2 puffs 3 times daily for 5 more days and then every 6 hours as needed thereafter. 1 Inhaler 2   albuterol (PROVENTIL) (2.5 MG/3ML) 0.083% nebulizer solution Take 2.5 mg by nebulization every 4 (four) hours as needed for wheezing or shortness of breath.      atorvastatin (LIPITOR) 40 MG tablet Take 40 mg by mouth daily.     clonazePAM (KLONOPIN) 0.5 MG tablet Take 0.5 mg by mouth daily.     diclofenac sodium (VOLTAREN) 1 % GEL Apply 1 application topically 3 (three) times daily as needed (pain).      DULoxetine (CYMBALTA) 60 MG capsule Take 60 mg by mouth daily before lunch.      ferrous sulfate 325 (65 FE) MG tablet Take 1 tablet (325 mg total) by mouth 3 (three) times daily with meals. 90 tablet 0   furosemide (LASIX) 40 MG tablet Take 40 mg by mouth daily as needed for fluid.      gabapentin (NEURONTIN) 600 MG tablet Take 600 mg by mouth 3 (three) times daily as needed (pain).      glimepiride (AMARYL) 1 MG tablet Take 1 mg by mouth daily with breakfast.     levothyroxine (SYNTHROID) 100 MCG tablet Take 1 tablet (100 mcg total)  by mouth daily before breakfast. 30 tablet 0   losartan (COZAAR) 50 MG tablet Take 50 mg by mouth daily.     metFORMIN (GLUCOPHAGE) 500 MG tablet Take 500 mg by mouth 2 (two) times daily.     metoprolol tartrate (LOPRESSOR) 25 MG tablet Take 25 mg by mouth 2 (two) times daily.     omeprazole (PRILOSEC) 40 MG capsule Take 40 mg by mouth daily as needed (heartburn/ acid reflux).      potassium chloride (K-DUR) 10 MEQ tablet Take 10 mEq by mouth 2 (two) times daily.      Tiotropium Bromide-Olodaterol (STIOLTO RESPIMAT) 2.5-2.5 MCG/ACT AERS Inhale 1 puff into the lungs daily as needed (shortness of breath).      traMADol  (ULTRAM) 50 MG tablet Take 50 mg by mouth every 6 (six) hours as needed for moderate pain (pain).      Vitamin D, Ergocalciferol, (DRISDOL) 50000 UNITS CAPS capsule Take 50,000 Units by mouth every Monday.       Allergies: Allergies as of 06/11/2021 - Review Complete 06/11/2021  Allergen Reaction Noted   Aspirin Other (See Comments) 10/06/2013   Past Medical History:  Diagnosis Date   Anemia 09/2016   Cirrhosis ([place]) 08/2015   Fatty liver seen on imaging of 2014.  Lifetime teetotaler.   DDD (degenerative disc disease)    With chronic pain   Diabetes mellitus without complication ([place])    Diastolic dysfunction 74/0814   Gallstone 2014   Incidental finding, no complications.   Hyperlipidemia    Hypertension    Hypokalemia 10/06/2013   Secondary to lasix   Hypoxia 10/07/2013   From mild resp depression from opioids   Mitral valve prolapse 09/2013   Mild per echo   Opioid intoxication delirium ([place]) 2015   Unintentional overdose with acute psychoses   Osteoporosis    Prolonged Q-T interval on ECG 10/07/2013   Secondary to hypokalemia   Pulmonary embolism ([place]) 2016   Started on Xarelto   Thyroid disease    Initially hyperthyroid, question Graves' disease, underwent thyroidectomy and subsequently hypothyroid   Tobacco abuse 10/07/2013   Family History: Brothers with history of strokes  Social History: Lives with daughter and her husband. Able to perform ADLs independently. Family helps with IADLs. Used to work as Training and development officer at [employer]. Used to smoke pack daily for >50 years. Denies any alcohol, tobacco use.  Review of Systems: A complete ROS was negative except as per HPI.   Physical Exam: Blood pressure (!) 145/55, pulse (!) 40, temperature 98.2 F (36.8 C), temperature source Oral, resp. rate 17, SpO2 95 %.  Gen: Well-developed, in distress HEENT: NCAT head, hearing intact, EOMI, MMM, nasal cannula on 2L Neck: supple, ROM intact, no JVD CV: Bradycardic, regular  rhythm, S1, S2 normal Pulm: Prolonged expiratory phase with significant wheezing, bilateral crackles Abd: Soft, BS+, NTND, No rebound, no guarding Extm: ROM intact, Peripheral pulses intact, trace ankle edema Skin: Dry, Warm, normal turgor Neuro: AAOx3  EKG: personally reviewed my interpretation is bradycardic, 1st degree AV block, + LBBB  CXR: personally reviewed my interpretation is ?left lobar lobe opacity, mild R pleural effusion, no pulmonary edema.  Assessment & Plan by Problem: Active Problems:   Pneumonia due to COVID-19 virus  Ms.Coia is a 75 yo F w/ PMH of DM, HFpEF, Chronic pain on tramadol, COPD, HTN, Post-surgical hypothyroidism, HLD, HTN presenting w/ acute hypoxic respiratory failure 2/2 COVID pneumonia  Acute hypoxic respiratory failure 2/2 COVID pneumonia COPD exacerbation Acute  on chronic diastolic heart failure Present w/ tachypnea, respiratory distress. During my exam, dropped to 88 while on RA. Improved to 97 on 2L. On exam, significant wheezing with accessory muscle use.COVID +, Chest X-ray w/ opacities.  Echo 08/2020 w/ EF 60-65% w/ grade 2 diastolic dysfunction, BNP 600. Current respiratory distress likely multifactorial in setting of COVID pneumonia, COPD exacerbation, diastolic heart failure. High risk for decompensation due to unvaccinated status w/ multiple comorbidities. Need admission for further treatment. Received 1 dose of IV solumedrol in ED. - F/u inflammatory markers (CRP, D-dimer) - Start remdesivir (day 1/5) - C/w dexamethasone (day 1/10) - Ipratropium-albuterol 1 puff q6hr - IV furosemide 40mg   - Trend CMP, inflammatory markers - Daily weight, I&Os - O2 as needed to keep >88 - Early ambulation, proning as tolerated, incentive spirometry - SSI while on steroids - C/w home meds: iotropium-olodaterol 2.5-2.9mcg daily  Symptomatic 1st Degree Heart Block Current HR 36, EKG showing prolonged PR. BP stable. On metoprolol tartrate at home. EP on board,  recommending pacemaker placement - Appreciate EP recs - Telemetry - Hold home Lopressor  Post-surgical hypothyroidism Prior hx of thyroidectomy for Graves Disease. Admit Tsh elevated at 7.67 but free T4 wnl.  - C/w home thyroxine dose at 125mcg daily  T2DM On metformin, glimepiride at home. Admit cbg 146 - SSI - Glucose checks  Iron deficiency Anemia Prior hx of iron deficiency anemia. On oral iron supplements at home. Admit hgb 10.8. Prior hgb ~8.2-8.4 - Trend cbc - Can resume iron supplements at d/c  Anxiety/Depression - C/w home meds: clonazepam prn, duloxetine  Chronic pain syndrome - C/w home meds: gabapentin, tramadolol  DVT prophx: Lovenox Diet: CM/HH Bowel: Senokot Code: Full   Prior to Admission Living Arrangement: Home Anticipated Discharge Location: Home Barriers to Discharge: Medical tx  Dispo: Admit patient to Inpatient with expected length of stay greater than 2 midnights.  Signed: Mosetta Anis, MD 06/11/2021, 12:01 PM Pager: [phone] After 5pm on weekdays and 1pm on weekends: On Call Pager: [phone]

## 2021-06-11 NOTE — ED Notes (Addendum)
Dr Gilberto Better notified of patient rhythm bigeminy with HR 50. Breathing is labored, congested and with expiratory wheezes noted.

## 2021-06-11 NOTE — ED Notes (Signed)
MD paged regarding pt hr

## 2021-06-11 NOTE — ED Notes (Signed)
Pharmacy at bedside

## 2021-06-11 NOTE — ED Notes (Signed)
1200 medications not yet verified by pharmacy.

## 2021-06-11 NOTE — ED Notes (Signed)
Daughter Katharine Look [phone] would like an update and to talk to her mother

## 2021-06-11 NOTE — ED Provider Notes (Signed)
[facility] EMERGENCY DEPARTMENT Provider Note   CSN: [id number] Arrival date & time: 06/11/21  8295     History Chief Complaint  Patient presents with   Bradycardia   Shortness of Breath    Rhonda Santos is a 75 y.o. female.  The history is provided by the patient and the EMS personnel.      Rhonda Santos is a 75 y.o. female, with a history of bradycardia, presenting to the ED with shortness of breath beginning last night. She endorses increased cough intermittently over the last several days. She also began to experience mild, left chest discomfort last night, but this had resolved at the time of my interview. EMS reports heart rate in the 30s, sinus bradycardia. Patient states her last dose of metoprolol was yesterday morning.  She thinks she has been compliant with her other medications. She was previously told she would need a pacemaker due to bradycardia, but previously declined this intervention.  She states she is now ready to have this performed.  Denies fever/chills, abdominal pain, N/V/D, dizziness, syncope, numbness, focal weakness, lower extremity edema, or any other complaints.  Past Medical History:  Diagnosis Date   Anemia 09/2016   Cirrhosis ([place]) 08/2015   Fatty liver seen on imaging of 2014.  Lifetime teetotaler.   DDD (degenerative disc disease)    With chronic pain   Diabetes mellitus without complication ([place])    Diastolic dysfunction 62/1308   Gallstone 2014   Incidental finding, no complications.   Hyperlipidemia    Hypertension    Hypokalemia 10/06/2013   Secondary to lasix   Hypoxia 10/07/2013   From mild resp depression from opioids   Mitral valve prolapse 09/2013   Mild per echo   Opioid intoxication delirium ([place]) 2015   Unintentional overdose with acute psychoses   Osteoporosis    Prolonged Q-T interval on ECG 10/07/2013   Secondary to hypokalemia   Pulmonary embolism ([place]) 2016   Started on Xarelto   Thyroid  disease    Initially hyperthyroid, question Graves' disease, underwent thyroidectomy and subsequently hypothyroid   Tobacco abuse 10/07/2013    Patient Active Problem List   Diagnosis Date Noted   Near syncope 08/31/2020   Bradycardia 08/31/2020   Iron deficiency anemia    Microcytic anemia 09/24/2016   Abdominal pain    COPD exacerbation ([place]) 12/25/2015   Acute respiratory failure with hypoxia ([place]) 12/25/2015   History of pulmonary embolus ([facility]) 12/25/2015   Chronic anticoagulation 12/25/2015   Hypothyroidism 12/25/2015   HTN (hypertension) 12/25/2015   GERD (gastroesophageal reflux disease) 12/25/2015   Acute abdominal pain    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   Sepsis with hypotension ([place]) 09/05/2015   Elevated lactic acid level 09/05/2015   Abdominal pain, acute 09/05/2015   Acute on chronic kidney failure ([place]) 09/05/2015   History of pulmonary embolism/ July 23, 2015 09/05/2015   Anemia 09/05/2015   Blood poisoning    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   Diabetes mellitus without complication ([place]) 65/78/4696   Prolonged Q-T interval on ECG 29/52/8413   Metabolic alkalosis 24/40/1027   Tobacco abuse 10/07/2013   Transaminitis 10/07/2013   Left ventricular diastolic dysfunction, NYHA class 2 10/07/2013   Hypokalemia 10/06/2013   Weakness 10/06/2013   Altered mental status 10/06/2013    Past Surgical History:  Procedure Laterality Date   COLONOSCOPY N/A 09/26/2016   Procedure: COLONOSCOPY;  Surgeon: Mauri Pole, MD;  Location: [facility];  Service: Endoscopy;  Laterality: N/A;   COLONOSCOPY N/A 09/27/2016   Procedure: COLONOSCOPY;  Surgeon: Mauri Pole, MD;  Location: [facility];  Service: Endoscopy;  Laterality: N/A;   ESOPHAGOGASTRODUODENOSCOPY N/A 09/26/2016   Procedure: ESOPHAGOGASTRODUODENOSCOPY (EGD);  Surgeon: Mauri Pole, MD;  Location: [facility] ENDOSCOPY;  Service: Endoscopy;  Laterality:  N/A;   THYROID SURGERY       OB History   No obstetric history on file.     Family History  Problem Relation Age of Onset   Schizophrenia Maternal Grandfather     Social History   Tobacco Use   Smoking status: Every Day    Packs/day: 2.00    Years: 37.00    Pack years: 74.00    Types: Cigarettes   Smokeless tobacco: Never  Vaping Use   Vaping Use: Never used  Substance Use Topics   Alcohol use: No   Drug use: No    Home Medications Prior to Admission medications   Medication Sig Start Date End Date Taking? Authorizing Provider  albuterol (PROVENTIL HFA;VENTOLIN HFA) 108 (90 Base) MCG/ACT inhaler Take 2 puffs 3 times daily for 5 more days and then every 6 hours as needed thereafter. 12/27/15   Rexene Alberts, MD  albuterol (PROVENTIL) (2.5 MG/3ML) 0.083% nebulizer solution Take 2.5 mg by nebulization every 4 (four) hours as needed for wheezing or shortness of breath.  08/19/16   [provider]  atorvastatin (LIPITOR) 40 MG tablet Take 40 mg by mouth daily.    [provider]  clonazePAM (KLONOPIN) 0.5 MG tablet Take 0.5 mg by mouth daily.    [provider]  diclofenac sodium (VOLTAREN) 1 % GEL Apply 1 application topically 3 (three) times daily as needed (pain).  08/16/16   [provider]  DULoxetine (CYMBALTA) 60 MG capsule Take 60 mg by mouth daily before lunch.  06/08/15   [provider]  ferrous sulfate 325 (65 FE) MG tablet Take 1 tablet (325 mg total) by mouth 3 (three) times daily with meals. 09/01/20 10/01/20  Antonieta Pert, MD  furosemide (LASIX) 40 MG tablet Take 40 mg by mouth daily as needed for fluid.     [provider]  gabapentin (NEURONTIN) 600 MG tablet Take 600 mg by mouth 3 (three) times daily as needed (pain).  08/14/20   [provider]  glimepiride (AMARYL) 1 MG tablet Take 1 mg by mouth daily with breakfast.    [provider]  levothyroxine (SYNTHROID) 100 MCG tablet Take 1 tablet (100  mcg total) by mouth daily before breakfast. 09/02/20 10/02/20  Antonieta Pert, MD  losartan (COZAAR) 50 MG tablet Take 50 mg by mouth daily. 08/07/16   [provider]  metFORMIN (GLUCOPHAGE) 500 MG tablet Take 500 mg by mouth 2 (two) times daily. 09/13/16   [provider]  metoprolol tartrate (LOPRESSOR) 25 MG tablet Take 25 mg by mouth 2 (two) times daily. 09/04/20   [provider]  omeprazole (PRILOSEC) 40 MG capsule Take 40 mg by mouth daily as needed (heartburn/ acid reflux).  09/28/13   [provider]  potassium chloride (K-DUR) 10 MEQ tablet Take 10 mEq by mouth 2 (two) times daily.     [provider]  Tiotropium Bromide-Olodaterol (STIOLTO RESPIMAT) 2.5-2.5 MCG/ACT AERS Inhale 1 puff into the lungs daily as needed (shortness of breath).     [provider]  traMADol (ULTRAM) 50 MG tablet Take 50 mg by mouth every 6 (six) hours as needed for moderate  pain (pain).  09/09/16   [provider]  Vitamin D, Ergocalciferol, (DRISDOL) 50000 UNITS CAPS capsule Take 50,000 Units by mouth every Monday.  07/17/15   [provider]    Allergies    Aspirin  Review of Systems   Review of Systems  Constitutional:  Negative for chills, diaphoresis and fever.  Respiratory:  Positive for cough and shortness of breath.   Cardiovascular:  Positive for chest pain. Negative for leg swelling.  Gastrointestinal:  Negative for abdominal pain, diarrhea, nausea and vomiting.  Musculoskeletal:  Negative for back pain.  Neurological:  Negative for dizziness, syncope, weakness and numbness.  All other systems reviewed and are negative.  Physical Exam Updated Vital Signs BP (!) 151/41 (BP Location: Right Arm)   Pulse (!) 40   Temp 98.1 F (36.7 C)   Resp (!) 28   SpO2 92%   Physical Exam Vitals and nursing note reviewed.  Constitutional:      General: She is not in acute distress.    Appearance: She is well-developed. She is not  diaphoretic.  HENT:     Head: Normocephalic and atraumatic.     Mouth/Throat:     Mouth: Mucous membranes are moist.     Pharynx: Oropharynx is clear.  Eyes:     Conjunctiva/sclera: Conjunctivae normal.  Cardiovascular:     Rate and Rhythm: Regular rhythm. Bradycardia present.     Pulses: Normal pulses.          Radial pulses are 2+ on the right side and 2+ on the left side.       Posterior tibial pulses are 2+ on the right side and 2+ on the left side.     Heart sounds: Normal heart sounds.     Comments: Tactile temperature in the extremities appropriate and equal bilaterally. Pulmonary:     Effort: Tachypnea present.     Breath sounds: Examination of the right-lower field reveals rales. Examination of the left-lower field reveals rales. Wheezing and rales present.     Comments: Initially, patient tachypneic with increased work of breathing.  She speaks in short sentences.  Room air SPO2 92 to 93%. Abdominal:     Palpations: Abdomen is soft.     Tenderness: There is no abdominal tenderness. There is no guarding.  Musculoskeletal:     Cervical back: Neck supple.     Right lower leg: No edema.     Left lower leg: No edema.  Lymphadenopathy:     Cervical: No cervical adenopathy.  Skin:    General: Skin is warm and dry.  Neurological:     Mental Status: She is alert and oriented to person, place, and time.  Psychiatric:        Mood and Affect: Mood and affect normal.        Speech: Speech normal.        Behavior: Behavior normal.    ED Results / Procedures / Treatments   Labs (all labs ordered are listed, but only abnormal results are displayed) Labs Reviewed  RESP PANEL BY RT-PCR (FLU A&B, COVID) ARPGX2 - Abnormal; Notable for the following components:      Result Value   SARS Coronavirus 2 by RT PCR POSITIVE (*)    All other components within normal limits  CBC WITH DIFFERENTIAL/PLATELET - Abnormal; Notable for the following components:   RBC 3.47 (*)    Hemoglobin 10.8  (*)    HCT 32.7 (*)    All other components within  normal limits  COMPREHENSIVE METABOLIC PANEL - Abnormal; Notable for the following components:   Sodium 133 (*)    Glucose, Bld 146 (*)    Creatinine, Ser 1.23 (*)    Albumin 3.1 (*)    GFR, Estimated 46 (*)    All other components within normal limits  TSH - Abnormal; Notable for the following components:   TSH 7.669 (*)    All other components within normal limits  BRAIN NATRIURETIC PEPTIDE - Abnormal; Notable for the following components:   B Natriuretic Peptide 592.6 (*)    All other components within normal limits  TROPONIN I (HIGH SENSITIVITY) - Abnormal; Notable for the following components:   Troponin I (High Sensitivity) 26 (*)    All other components within normal limits  TROPONIN I (HIGH SENSITIVITY) - Abnormal; Notable for the following components:   Troponin I (High Sensitivity) 30 (*)    All other components within normal limits  T4, FREE  T3, FREE  URINALYSIS, ROUTINE W REFLEX MICROSCOPIC    Hemoglobin  Date Value Ref Range Status  06/11/2021 10.8 (L) 12.0 - 15.0 g/dL Final  09/01/2020 8.2 (L) 12.0 - 15.0 g/dL Final    Comment:    Reticulocyte Hemoglobin testing may be clinically indicated, consider ordering this additional test [id number]   08/31/2020 8.4 (L) 12.0 - 15.0 g/dL Final  08/27/2019 10.4 (L) 12.0 - 15.0 g/dL Final   BUN  Date Value Ref Range Status  06/11/2021 23 8 - 23 mg/dL Final  09/01/2020 8 8 - 23 mg/dL Final  08/31/2020 9 8 - 23 mg/dL Final  08/27/2019 8 8 - 23 mg/dL Final   Creatinine, Ser  Date Value Ref Range Status  06/11/2021 1.23 (H) 0.44 - 1.00 mg/dL Final  09/01/2020 1.16 (H) 0.44 - 1.00 mg/dL Final  08/31/2020 1.55 (H) 0.44 - 1.00 mg/dL Final  08/27/2019 1.18 (H) 0.44 - 1.00 mg/dL Final     EKG EKG Interpretation  Date/Time:  Monday June 11 2021 06:51:34 EDT Ventricular Rate:  105 PR Interval:    QRS Duration: 139 QT Interval:  327 QTC Calculation: 405 R  Axis:   -65 Text Interpretation: sinus bradycardia first degree avb Paired ventricular premature complexes Left bundle branch block Confirmed by Randal Buba, April ([id number]) on 06/11/2021 6:54:03 AM  Radiology DG Chest Portable 1 View  Result Date: 06/11/2021 CLINICAL DATA:  Shortness of breath. EXAM: PORTABLE CHEST 1 VIEW COMPARISON:  08/31/2020. FINDINGS: Mildly enlarged cardiac silhouette, similar to priors. Linear left basilar opacities, similar to priors. Chronic medial right basilar opacity correlates with prominent diaphragmatic/mediastinal fat on prior CT. No new consolidation. No visible pneumothorax or pleural effusion. No acute osseous abnormality. IMPRESSION: 1. Linear left basilar opacities, similar to priors and most likely atelectasis/scar. 2. Similar cardiomegaly. Electronically Signed   By: Margaretha Sheffield MD   On: 06/11/2021 07:30    Procedures Procedures   Medications Ordered in ED Medications  methylPREDNISolone sodium succinate (SOLU-MEDROL) 125 mg/2 mL injection 125 mg (has no administration in time range)    ED Course  I have reviewed the triage vital signs and the nursing notes.  Pertinent labs & imaging results that were available during my care of the patient were reviewed by me and considered in my medical decision making (see chart for details).  Clinical Course as of 06/11/21 1115  Mon Jun 11, 2021  0730 No increased work of breathing.  Speaks in full sentences.  States she feels improved on the oxygen.  No declining  mental status or overall appearance noted. [SJ]  [id number] Spoke with Dr. Radford Pax, cardiology.  We discussed the patient's symptoms, presentation, vital signs.  She states she will contact the on-call person for EP and have them see the patient. [SJ]  0856 TSH(!): 7.669 Noted to be around 31 in September 2021. [SJ]  1001 Spoke with Dr. Lovena Le, cardiologist. They state since patient appears to be stable, they will not place a pacemaker today.  They do request  patient be admitted via medicine service for continued management of the patient's shortness of breath.  Cardiology will continue to consult. [SJ]  [place] with Josh, IM resident. Agrees to admit the patient. [SJ]    Clinical Course User Index [SJ] Katty Fretwell C, PA-C   MDM Rules/Calculators/A&P                          Patient presents with complaint of shortness of breath and chest discomfort. Afebrile.  Patient had increased work of breathing on room air, though SPO2 was around 92%.  Improved with supplemental O2.  Patient is indeed bradycardic, but with sinus bradycardia and no evidence of high-level heart block. On chart review Dr. Jackalyn Lombard note from last fall indicates patient's history of bradycardia as well as the importance of cessation of the metoprolol.  Patient stopped taking this, but reportedly her PCP added it back into the regimen. She does have an elevated TSH, though improved from the last value noted in her chart from September 2021.  My suspicion for higher level emergency, such as myxedema coma, is low since the patient has no altered mental status, she has no hypotension, and has no hypothermia. Patient tested positive for COVID.  Patient admitted for further management.   Findings and plan of care discussed with attending physician, Calvert Cantor, MD. Dr. Karle Starch personally evaluated and examined this patient.  Vitals:   06/11/21 0647 06/11/21 0652 06/11/21 0730  BP:  (!) 151/41 (!) 118/38  Pulse:  (!) 40 (!) 37  Resp:  (!) 28 20  Temp:  98.1 F (36.7 C) 98.2 F (36.8 C)  TempSrc:   Oral  SpO2: 93% 92% 95%     Final Clinical Impression(s) / ED Diagnoses Final diagnoses:  Shortness of breath  Cough  COVID-19  Bradycardia    Rx / DC Orders ED Discharge Orders     None        Layla Maw 06/11/21 1115    Truddie Hidden, MD 06/11/21 1357

## 2021-06-11 NOTE — ED Notes (Signed)
Katharine Look daughter [phone] requesting an update

## 2021-06-11 NOTE — Consult Note (Addendum)
EP consult:   Patient ID: Rhonda Santos MRN: [id number]; DOB: [birth year]   Admission date: 06/11/2021  PCP:  Vidal Schwalbe, MD   [facility] Cardiologist:  Dr. Gardiner Rhyme (new last admission)   Chief Complaint:  SOB  Patient Profile:   Rhonda Santos is a 76 y.o. female with PMHx of chronic diastolic CHF, LBBB, HTN, HLD, DM2, COPD, thyroid disease, PE, cirrhosis, 2/2 Non-alcoholic fatty liver disease, CKD III, anxiety/depression, obesity, and tobacco abuse who is being seen 06/11/2021 for the evaluation of recurrent bradycardia.  History of Present Illness:   Rhonda Santos was hospitalized 08/31/20  "whole body pain", weakness, and near-syncope and called 911.  EMS strips show HRs in the 20-30s with bradycardia and bigeminy with occasional transcutaneous pacing, suspect she had CHB.  Her home metorpolol was stopped. HS Trop were neagative, TTE noted preserved LVEF  She was seen by EP, Dr. Rayann Heman, she had regained NSR and discussed pacer though she wanted to try and avoid this.  Discussed low threshold to pursue pacing with long 1st degree AVblock and LBBB. Discharged 09/01/21  She had follow up with Dr. Rayann Heman oct 2021, she was feeling well, no recurrent symptoms, lopressor was on her list of medicines and he re-enforced need to stop this and planned for monitor. Sinus rhythm with first degree AV block Rare premature atrial contractions and premature ventricular contractions Nonsustained atrial tachycardia is noted No afib No AV block or pauses Baseline artifact limits interpretation at times Dr. Joylene Grapes discussed Her average heart rate was 63 bpm.  Her heart rate seemed to increase appropriately when needed.  he was not convinced that she would benefit from a pacemaker at that time, though planned to discuss further when he saw her next.  She returned to the ER early this AM via EMS with SOB that started the evening prior perhaps with a component of CP as well, EMS found her  bradycardic 30's (sinus), ER records reports last dose of metoprolol was last mornin.  LABS K+ 4.7 BUN/Creat 23/1.23 BNP 592 HS Trop 25 WBC 9.5 H/H 10/32 Plts 215  TSH 7.669   The patient tells me that she was up all night feeling SOB, this AM she used both her nebulaizer and inhaler with minimal improvement, She denies to my any kind of CP.  Her daughter reports for 3-4 weeks her Mom feeling poorly, on/off more SOB and her observation was that when her O2 evels were low her HR was faster when her O2 levels were better her HR was slower, though can  not recall any specific readings. A week or 2 ago she had a near fal/faint episode, EMS was called and her BS was "really low" and treated, she thinks her HR was also low, but not transported. Wed of last week she saw her PMD for cough/SOB and was given doxycycline whch she completed and her thryroid medicine was increased.  No other near syncope, no syncope reported.  Daughter reports that generally she feels weak, though does have days that she seems to feel better.  About a week or 2 after he last visit Dr. Rayann Heman last fall, she saw her PMD and the daughter reports that she insisted to put back on her BP pill (metoprolol) for high BPs and the metoprolol tart. 25mg  was resumed once daily (bottle says twice but she only takes in the AM)  Her last dose about 1000 yesterday AM   COVID test just resulted + (after I saw her)  Past Medical History:  Diagnosis Date   Anemia 09/2016   Cirrhosis ([place]) 08/2015   Fatty liver seen on imaging of 2014.  Lifetime teetotaler.   DDD (degenerative disc disease)    With chronic pain   Diabetes mellitus without complication ([place])    Diastolic dysfunction 65/4650   Gallstone 2014   Incidental finding, no complications.   Hyperlipidemia    Hypertension    Hypokalemia 10/06/2013   Secondary to lasix   Hypoxia 10/07/2013   From mild resp depression from opioids   Mitral valve prolapse 09/2013    Mild per echo   Opioid intoxication delirium ([place]) 2015   Unintentional overdose with acute psychoses   Osteoporosis    Prolonged Q-T interval on ECG 10/07/2013   Secondary to hypokalemia   Pulmonary embolism ([place]) 2016   Started on Xarelto   Thyroid disease    Initially hyperthyroid, question Graves' disease, underwent thyroidectomy and subsequently hypothyroid   Tobacco abuse 10/07/2013    Past Surgical History:  Procedure Laterality Date   COLONOSCOPY N/A 09/26/2016   Procedure: COLONOSCOPY;  Surgeon: Mauri Pole, MD;  Location: [facility] ENDOSCOPY;  Service: Endoscopy;  Laterality: N/A;   COLONOSCOPY N/A 09/27/2016   Procedure: COLONOSCOPY;  Surgeon: Mauri Pole, MD;  Location: [facility] ENDOSCOPY;  Service: Endoscopy;  Laterality: N/A;   ESOPHAGOGASTRODUODENOSCOPY N/A 09/26/2016   Procedure: ESOPHAGOGASTRODUODENOSCOPY (EGD);  Surgeon: Mauri Pole, MD;  Location: [facility] ENDOSCOPY;  Service: Endoscopy;  Laterality: N/A;   THYROID SURGERY       Medications Prior to Admission: Prior to Admission medications   Medication Sig Start Date End Date Taking? Authorizing Provider  albuterol (PROVENTIL HFA;VENTOLIN HFA) 108 (90 Base) MCG/ACT inhaler Take 2 puffs 3 times daily for 5 more days and then every 6 hours as needed thereafter. 12/27/15   Rexene Alberts, MD  albuterol (PROVENTIL) (2.5 MG/3ML) 0.083% nebulizer solution Take 2.5 mg by nebulization every 4 (four) hours as needed for wheezing or shortness of breath.  08/19/16   [provider]  atorvastatin (LIPITOR) 40 MG tablet Take 40 mg by mouth daily.    [provider]  clonazePAM (KLONOPIN) 0.5 MG tablet Take 0.5 mg by mouth daily.    [provider]  diclofenac sodium (VOLTAREN) 1 % GEL Apply 1 application topically 3 (three) times daily as needed (pain).  08/16/16   [provider]  DULoxetine (CYMBALTA) 60 MG capsule Take 60 mg by mouth daily before lunch.  06/08/15   [provider]   ferrous sulfate 325 (65 FE) MG tablet Take 1 tablet (325 mg total) by mouth 3 (three) times daily with meals. 09/01/20 10/01/20  Antonieta Pert, MD  furosemide (LASIX) 40 MG tablet Take 40 mg by mouth daily as needed for fluid.     [provider]  gabapentin (NEURONTIN) 600 MG tablet Take 600 mg by mouth 3 (three) times daily as needed (pain).  08/14/20   [provider]  glimepiride (AMARYL) 1 MG tablet Take 1 mg by mouth daily with breakfast.    [provider]  levothyroxine (SYNTHROID) 100 MCG tablet Take 1 tablet (100 mcg total) by mouth daily before breakfast. 09/02/20 10/02/20  Antonieta Pert, MD  losartan (COZAAR) 50 MG tablet Take 50 mg by mouth daily. 08/07/16   [provider]  metFORMIN (GLUCOPHAGE) 500 MG tablet Take 500 mg by mouth 2 (two) times daily. 09/13/16   [provider]  metoprolol tartrate (LOPRESSOR) 25 MG tablet Take 25 mg by  mouth 2 (two) times daily. 09/04/20   [provider]  omeprazole (PRILOSEC) 40 MG capsule Take 40 mg by mouth daily as needed (heartburn/ acid reflux).  09/28/13   [provider]  potassium chloride (K-DUR) 10 MEQ tablet Take 10 mEq by mouth 2 (two) times daily.     [provider]  Tiotropium Bromide-Olodaterol (STIOLTO RESPIMAT) 2.5-2.5 MCG/ACT AERS Inhale 1 puff into the lungs daily as needed (shortness of breath).     [provider]  traMADol (ULTRAM) 50 MG tablet Take 50 mg by mouth every 6 (six) hours as needed for moderate pain (pain).  09/09/16   [provider]  Vitamin D, Ergocalciferol, (DRISDOL) 50000 UNITS CAPS capsule Take 50,000 Units by mouth every Monday.  07/17/15   [provider]     Allergies:    Allergies  Allergen Reactions   Aspirin Other (See Comments)    Burns line in stomach    Social History:   Social History   Socioeconomic History   Marital status: Widowed    Spouse name: Not on file   Number of children: Not on file   Years  of education: Not on file   Highest education level: Not on file  Occupational History   Not on file  Tobacco Use   Smoking status: Every Day    Packs/day: 2.00    Years: 37.00    Pack years: 74.00    Types: Cigarettes   Smokeless tobacco: Never  Vaping Use   Vaping Use: Never used  Substance and Sexual Activity   Alcohol use: No   Drug use: No   Sexual activity: Not Currently  Other Topics Concern   Not on file  Social History Narrative   Not on file   Social Determinants of Health   Financial Resource Strain: Not on file  Food Insecurity: Not on file  Transportation Needs: Not on file  Physical Activity: Not on file  Stress: Not on file  Social Connections: Not on file  Intimate Partner Violence: Not on file    Family History:   [name] family history includes Schizophrenia in her maternal grandfather.    ROS:  Please see the history of present illness.  All other ROS reviewed and negative.     Physical Exam/Data:   Vitals:   06/11/21 0730 06/11/21 0745 06/11/21 0800 06/11/21 0815  BP: (!) 118/38 102/81 (!) 112/34 (!) 114/36  Pulse: (!) 37 (!) 37 (!) 36 (!) 34  Resp: 20 17 20  (!) 24  Temp: 98.2 F (36.8 C)     TempSrc: Oral     SpO2: 95% 97% 96% 96%   No intake or output data in the 24 hours ending 06/11/21 0852 Last 3 Weights 09/29/2020 09/01/2020 08/31/2020  Weight (lbs) 190 lb 12.8 oz 197 lb 12 oz 196 lb 6.9 oz  Weight (kg) 86.546 kg 89.7 kg 89.1 kg  Some encounter information is confidential and restricted. Go to Review Flowsheets activity to see all data.     There is no height or weight on file to calculate BMI.  General:  Well nourished, well developed, in no acute distress HEENT: normal Lymph: no adenopathy Neck: no JVD Endocrine:  No thryomegaly Vascular: No carotid bruits  Cardiac:  RRR; bradycardic, soft SM Lungs:  exp wheezes L>R, bronchial BS that nearly clear with cough, no rhonchi or rales  Abd: soft, non tender  Ext: no  edema Musculoskeletal:  No deformities Skin: warm and dry  Neuro:  her l eye tends to stay close but she can open it, says this has been baseline since cataract surgery last year no focal abnormalities noted Psych:  Normal affect    EKG:  personally reviewed and demonstrate  2:1 AVblock, LBBB, no R wave progression, V rate 30's  OLD 09/29/20: SR 65bpm, LBBB, LAS, 1st degree AVblock, PR 215ms 9/9/212 SR 66bpm, poor R progression, 1st degree AVblock, POR 252ms  Relevant CV Studies:  08/31/21: TTE IMPRESSIONS   1. Left ventricular ejection fraction, by estimation, is 60 to 65%. The  left ventricle has normal function. The left ventricle has no regional  wall motion abnormalities. There is moderate left ventricular hypertrophy.  Left ventricular diastolic  parameters are consistent with Grade II diastolic dysfunction  (pseudonormalization).   2. Right ventricular systolic function is normal. The right ventricular  size is normal. There is normal pulmonary artery systolic pressure.   3. Left atrial size was mildly dilated.   4. A small pericardial effusion is present.   5. The mitral valve is normal in structure. Trivial mitral valve  regurgitation. No evidence of mitral stenosis.   6. The aortic valve is tricuspid. There is moderate calcification of the  aortic valve. There is mild thickening of the aortic valve. Aortic valve  regurgitation is trivial. Mild aortic valve sclerosis is present, with no  evidence of aortic valve  stenosis.   7. The inferior vena cava is normal in size with greater than 50%  respiratory variability, suggesting right atrial pressure of 3 mmHg.   Comparison(s): No significant change from prior study.   FINDINGS   Laboratory Data:  High Sensitivity Troponin:   Recent Labs  Lab 06/11/21 0724  TROPONINIHS 26*      Chemistry Recent Labs  Lab 06/11/21 0724  NA 133*  K 4.7  CL 98  CO2 27  GLUCOSE 146*  BUN 23  CREATININE 1.23*  CALCIUM 9.2   GFRNONAA 46*  ANIONGAP 8    Recent Labs  Lab 06/11/21 0724  PROT 6.8  ALBUMIN 3.1*  AST 23  ALT 17  ALKPHOS 90  BILITOT 0.9   Hematology Recent Labs  Lab 06/11/21 0724  WBC 9.5  RBC 3.47*  HGB 10.8*  HCT 32.7*  MCV 94.2  MCH 31.1  MCHC 33.0  RDW 14.0  PLT 215   BNP Recent Labs  Lab 06/11/21 0724  BNP 592.6*    DDimer No results for input(s): DDIMER in the last 168 hours.   Radiology/Studies:  DG Chest Portable 1 View  Result Date: 06/11/2021 CLINICAL DATA:  Shortness of breath. EXAM: PORTABLE CHEST 1 VIEW COMPARISON:  08/31/2020. FINDINGS: Mildly enlarged cardiac silhouette, similar to priors. Linear left basilar opacities, similar to priors. Chronic medial right basilar opacity correlates with prominent diaphragmatic/mediastinal fat on prior CT. No new consolidation. No visible pneumothorax or pleural effusion. No acute osseous abnormality. IMPRESSION: 1. Linear left basilar opacities, similar to priors and most likely atelectasis/scar. 2. Similar cardiomegaly. 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   Dr. Lovena Le d/w ER MD Medicine team to admit and manage COVID and other chronic medical issues   Risk Assessment/Risk Scores:  { For questions or updates, please contact [facility] Please consult [email or web address] for contact info under     Signed, Baldwin Jamaica, PA-C  06/11/2021 8:52 AM   EP Attending  Patient seen and examined. She is a pleasant morbidly obese, ill appearing woman with known COPD and conduction  system disease who has felt poorly for several weeks with reduced energy. She was seen in her MD's office last week. A couple of days later she started coughing but no fever. She coughs chronically but her current cough worsened over the past few days. She presented for eval and noted to have bradycardia with 2:1 AV block. She has baseline left bundle and first degree AV block and had been recommended for a PPM but Dr.  Greggory Brandy in the past. She refused. She is now willing.  Exam demonstrates a chronically ill appearing obese woman, NAD. HR 36 and sbp was 114. No increased work of breathing. Tele reveals NSR with 2:1 AV block and some AVWB.  A/P Symptomatic heart block - she has been off of her beta blocker for 24 hours. We will hope that her conduction improves. I suspect that she will need PPM but with covid infection, might need to wait until she is not contagious or unless she becomes more symptomatic. Covid - see above. I'll defer treatment if any to the ID/hospital service. She has many comorbidities.    Carleene Overlie Madalyn Legner,MD

## 2021-06-11 NOTE — ED Notes (Signed)
Reports "feels better", breathing less labored.

## 2021-06-11 NOTE — ED Notes (Signed)
Provider at bedside

## 2021-06-12 ENCOUNTER — Inpatient Hospital Stay (HOSPITAL_COMMUNITY)
Admission: EM | Disposition: A | Discharge status: Home / Self Care | Payer: Self-pay | Source: Home / Self Care | Attending: Internal Medicine

## 2021-06-12 ENCOUNTER — Other Ambulatory Visit: Payer: Self-pay

## 2021-06-12 DIAGNOSIS — I441 Atrioventricular block, second degree: Secondary | ICD-10-CM

## 2021-06-12 DIAGNOSIS — U071 COVID-19: Secondary | ICD-10-CM | POA: Diagnosis present

## 2021-06-12 DIAGNOSIS — J1282 Pneumonia due to coronavirus disease 2019: Secondary | ICD-10-CM

## 2021-06-12 HISTORY — PX: PACEMAKER IMPLANT: EP1218

## 2021-06-12 LAB — GLUCOSE, CAPILLARY
Glucose-Capillary: 209 mg/dL — ABNORMAL HIGH (ref 70–99)
Glucose-Capillary: 159 mg/dL — ABNORMAL HIGH (ref 70–99)

## 2021-06-12 LAB — COMPREHENSIVE METABOLIC PANEL
ALT: 15 U/L (ref 0–44)
AST: 20 U/L (ref 15–41)
Albumin: 2.9 g/dL — ABNORMAL LOW (ref 3.5–5.0)
Alkaline Phosphatase: 82 U/L (ref 38–126)
Anion gap: 14 (ref 5–15)
BUN: 29 mg/dL — ABNORMAL HIGH (ref 8–23)
CO2: 24 mmol/L (ref 22–32)
Calcium: 9.1 mg/dL (ref 8.9–10.3)
Chloride: 96 mmol/L — ABNORMAL LOW (ref 98–111)
Creatinine, Ser: 1.66 mg/dL — ABNORMAL HIGH (ref 0.44–1.00)
GFR, Estimated: 32 mL/min — ABNORMAL LOW (ref >60)
Glucose, Bld: 192 mg/dL — ABNORMAL HIGH (ref 70–99)
Potassium: 4.5 mmol/L (ref 3.5–5.1)
Sodium: 134 mmol/L — ABNORMAL LOW (ref 135–145)
Total Bilirubin: 0.7 mg/dL (ref 0.3–1.2)
Total Protein: 6.6 g/dL (ref 6.5–8.1)

## 2021-06-12 LAB — CBC WITH DIFFERENTIAL/PLATELET
Abs Immature Granulocytes: 0.02 10*3/uL (ref 0.00–0.07)
Basophils Absolute: 0 10*3/uL (ref 0.0–0.1)
Basophils Relative: 0 %
Eosinophils Absolute: 0 10*3/uL (ref 0.0–0.5)
Eosinophils Relative: 0 %
HCT: 33.3 % — ABNORMAL LOW (ref 36.0–46.0)
Hemoglobin: 11 g/dL — ABNORMAL LOW (ref 12.0–15.0)
Immature Granulocytes: 0 %
Lymphocytes Relative: 16 %
Lymphs Abs: 1 10*3/uL (ref 0.7–4.0)
MCH: 31.5 pg (ref 26.0–34.0)
MCHC: 33 g/dL (ref 30.0–36.0)
MCV: 95.4 fL (ref 80.0–100.0)
Monocytes Absolute: 0.3 10*3/uL (ref 0.1–1.0)
Monocytes Relative: 5 %
Neutro Abs: 4.9 10*3/uL (ref 1.7–7.7)
Neutrophils Relative %: 79 %
Platelets: 182 10*3/uL (ref 150–400)
RBC: 3.49 MIL/uL — ABNORMAL LOW (ref 3.87–5.11)
RDW: 14.1 % (ref 11.5–15.5)
WBC: 6.2 10*3/uL (ref 4.0–10.5)
nRBC: 0 % (ref 0.0–0.2)

## 2021-06-12 LAB — TROPONIN I (HIGH SENSITIVITY)
Troponin I (High Sensitivity): 38 ng/L — ABNORMAL HIGH (ref <18)
Troponin I (High Sensitivity): 55 ng/L — ABNORMAL HIGH (ref <18)

## 2021-06-12 LAB — FERRITIN: Ferritin: 73 ng/mL (ref 11–307)

## 2021-06-12 LAB — CBG MONITORING, ED
Glucose-Capillary: 127 mg/dL — ABNORMAL HIGH (ref 70–99)
Glucose-Capillary: 160 mg/dL — ABNORMAL HIGH (ref 70–99)
Glucose-Capillary: 179 mg/dL — ABNORMAL HIGH (ref 70–99)

## 2021-06-12 LAB — MAGNESIUM: Magnesium: 1.6 mg/dL — ABNORMAL LOW (ref 1.7–2.4)

## 2021-06-12 LAB — PHOSPHORUS: Phosphorus: 3.9 mg/dL (ref 2.5–4.6)

## 2021-06-12 LAB — T3, FREE: T3, Free: 2 pg/mL (ref 2.0–4.4)

## 2021-06-12 LAB — C-REACTIVE PROTEIN: CRP: 9.1 mg/dL — ABNORMAL HIGH (ref <1.0)

## 2021-06-12 LAB — D-DIMER, QUANTITATIVE: D-Dimer, Quant: 0.4 ug/mL-FEU (ref 0.00–0.50)

## 2021-06-12 SURGERY — PACEMAKER IMPLANT

## 2021-06-12 MED ORDER — LIDOCAINE HCL 1 % IJ SOLN
INTRAMUSCULAR | Status: AC
  Filled 2021-06-12: qty 20

## 2021-06-12 MED ORDER — SODIUM CHLORIDE 0.9% FLUSH
3.0000 mL | Freq: Two times a day (BID) | INTRAVENOUS | Status: DC
  Administered 2021-06-13 – 2021-06-14 (×3): 3 mL via INTRAVENOUS

## 2021-06-12 MED ORDER — CEFAZOLIN SODIUM-DEXTROSE 1-4 GM/50ML-% IV SOLN
1.0000 g | Freq: Four times a day (QID) | INTRAVENOUS | Status: AC
  Administered 2021-06-12 – 2021-06-13 (×3): 1 g via INTRAVENOUS
  Filled 2021-06-12 (×3): qty 50

## 2021-06-12 MED ORDER — LIDOCAINE HCL (PF) 1 % IJ SOLN
INTRAMUSCULAR | Status: DC | PRN
  Administered 2021-06-12: 80 mL

## 2021-06-12 MED ORDER — CEFAZOLIN SODIUM-DEXTROSE 2-4 GM/100ML-% IV SOLN
INTRAVENOUS | Status: AC
  Filled 2021-06-12: qty 100

## 2021-06-12 MED ORDER — IOHEXOL 350 MG/ML SOLN
INTRAVENOUS | Status: DC | PRN
  Administered 2021-06-12: 10 mL

## 2021-06-12 MED ORDER — MAGNESIUM SULFATE 2 GM/50ML IV SOLN
2.0000 g | Freq: Once | INTRAVENOUS | Status: AC
  Administered 2021-06-12: 2 g via INTRAVENOUS
  Filled 2021-06-12: qty 50

## 2021-06-12 MED ORDER — NICOTINE 21 MG/24HR TD PT24
21.0000 mg | MEDICATED_PATCH | Freq: Every day | TRANSDERMAL | Status: DC
  Administered 2021-06-12 – 2021-06-14 (×4): 21 mg via TRANSDERMAL
  Filled 2021-06-12 (×4): qty 1

## 2021-06-12 MED ORDER — SODIUM CHLORIDE 0.9 % IV SOLN
80.0000 mg | INTRAVENOUS | Status: AC
  Administered 2021-06-12: 80 mg

## 2021-06-12 MED ORDER — DOPAMINE-DEXTROSE 3.2-5 MG/ML-% IV SOLN
0.0000 ug/kg/min | INTRAVENOUS | Status: DC
  Administered 2021-06-12: 2.5 ug/kg/min via INTRAVENOUS
  Filled 2021-06-12: qty 250

## 2021-06-12 MED ORDER — LIDOCAINE HCL 1 % IJ SOLN
INTRAMUSCULAR | Status: AC
  Filled 2021-06-12: qty 60

## 2021-06-12 MED ORDER — CHLORHEXIDINE GLUCONATE 4 % EX LIQD
60.0000 mL | Freq: Once | CUTANEOUS | Status: DC
  Filled 2021-06-12: qty 60

## 2021-06-12 MED ORDER — MELATONIN 3 MG PO TABS
3.0000 mg | ORAL_TABLET | Freq: Every day | ORAL | Status: DC
  Administered 2021-06-12 – 2021-06-13 (×2): 3 mg via ORAL
  Filled 2021-06-12 (×2): qty 1

## 2021-06-12 MED ORDER — SODIUM CHLORIDE 0.9% FLUSH: 3.0000 mL | Freq: Two times a day (BID) | INTRAVENOUS | Status: DC

## 2021-06-12 MED ORDER — DEXAMETHASONE SODIUM PHOSPHATE 10 MG/ML IJ SOLN
6.0000 mg | Freq: Once | INTRAMUSCULAR | Status: AC
  Administered 2021-06-12: 6 mg via INTRAVENOUS
  Filled 2021-06-12: qty 1

## 2021-06-12 MED ORDER — SODIUM CHLORIDE 0.9% FLUSH
3.0000 mL | INTRAVENOUS | Status: DC | PRN
Start: 2021-06-12 — End: 2021-06-14

## 2021-06-12 MED ORDER — HYDROCODONE-ACETAMINOPHEN 5-325 MG PO TABS
1.0000 | ORAL_TABLET | ORAL | Status: DC | PRN
  Administered 2021-06-14: 1 via ORAL
  Filled 2021-06-12: qty 1

## 2021-06-12 MED ORDER — LEVOTHYROXINE SODIUM 75 MCG PO TABS
150.0000 ug | ORAL_TABLET | Freq: Every day | ORAL | Status: DC
  Administered 2021-06-13 – 2021-06-14 (×2): 150 ug via ORAL
  Filled 2021-06-12 (×2): qty 2

## 2021-06-12 MED ORDER — SODIUM CHLORIDE 0.9 % IV SOLN
INTRAVENOUS | Status: AC
  Filled 2021-06-12: qty 2

## 2021-06-12 MED ORDER — DEXAMETHASONE 6 MG PO TABS
6.0000 mg | ORAL_TABLET | ORAL | Status: DC
  Administered 2021-06-13 – 2021-06-14 (×2): 6 mg via ORAL
  Filled 2021-06-12 (×3): qty 1

## 2021-06-12 MED ORDER — SODIUM CHLORIDE 0.9 % IV SOLN: 250.0000 mL | INTRAVENOUS | Status: DC | PRN

## 2021-06-12 MED ORDER — CEFAZOLIN SODIUM-DEXTROSE 2-4 GM/100ML-% IV SOLN
2.0000 g | INTRAVENOUS | Status: AC
  Administered 2021-06-12: 2 g via INTRAVENOUS

## 2021-06-12 MED ORDER — INSULIN ASPART 100 UNIT/ML IJ SOLN
7.0000 [IU] | Freq: Once | INTRAMUSCULAR | Status: AC
  Administered 2021-06-12: 7 [IU] via SUBCUTANEOUS

## 2021-06-12 MED ORDER — ACETAMINOPHEN 325 MG PO TABS: 325.0000 mg | ORAL_TABLET | ORAL | Status: DC | PRN

## 2021-06-12 MED ORDER — SODIUM CHLORIDE 0.9 % IV SOLN: INTRAVENOUS | Status: DC

## 2021-06-12 MED ORDER — LACTATED RINGERS IV BOLUS
500.0000 mL | Freq: Once | INTRAVENOUS | Status: AC
  Administered 2021-06-12: 500 mL via INTRAVENOUS

## 2021-06-12 MED ORDER — HEPARIN (PORCINE) IN NACL 1000-0.9 UT/500ML-% IV SOLN
INTRAVENOUS | Status: DC | PRN
  Administered 2021-06-12: 500 mL

## 2021-06-12 MED ORDER — SODIUM CHLORIDE 0.9% FLUSH: 3.0000 mL | INTRAVENOUS | Status: DC | PRN

## 2021-06-12 SURGICAL SUPPLY — 9 items
CABLE SURGICAL S-101-97-12 (CABLE) ×2 IMPLANT
KIT MICROPUNCTURE NIT STIFF (SHEATH) ×2 IMPLANT
LEAD TENDRIL MRI 46CM LPA1200M (Lead) ×2 IMPLANT
LEAD TENDRIL MRI 58CM LPA1200M (Lead) ×2 IMPLANT
MAT PREVALON FULL STRYKER (MISCELLANEOUS) ×2 IMPLANT
PACEMAKER ASSURITY DR-RF (Pacemaker) ×2 IMPLANT
PAD PRO RADIOLUCENT 2001M-C (PAD) ×2 IMPLANT
SHEATH 8FR PRELUDE SNAP 13 (SHEATH) ×4 IMPLANT
TRAY PACEMAKER INSERTION (PACKS) ×2 IMPLANT

## 2021-06-12 NOTE — ED Notes (Signed)
Truman Hayward MD at bedside. EKG obtained. Pt reports CP is gone now.

## 2021-06-12 NOTE — Interval H&P Note (Signed)
History and Physical Interval Note:  06/12/2021 12:28 PM  Rhonda Santos  has presented today for surgery, with the diagnosis of heart block.  The various methods of treatment have been discussed with the patient and family. After consideration of risks, benefits and other options for treatment, the patient has consented to  Procedure(s): PACEMAKER IMPLANT (N/A) as a surgical intervention.  [name] history has been reviewed, patient examined, no change in status, stable for surgery.  I have reviewed the patient's chart and labs.  Questions were answered to the patient's satisfaction.     Thompson Grayer

## 2021-06-12 NOTE — ED Notes (Signed)
Pt reporting she doesn't feel good. I asked what doesn't feel good and pt said she feels weaker and tired out. Pt appears distressed. Notified Lee MD and is on his way. HR 48, BP 109/48, rhythm conduction 2:1 w/ PVC's. O2 94% on 2L

## 2021-06-12 NOTE — ED Notes (Signed)
Repositioning BP cuff d/t still unable to obtain automatic BP measurements

## 2021-06-12 NOTE — ED Notes (Signed)
Truman Hayward MD at bedside. Pt reporting lightheadedness and dizziness.

## 2021-06-12 NOTE — Progress Notes (Signed)
Paged by nursing staff regarding dyspnea. Examined and evaluated [name] at bedside. She is alert, oriented, able to converse appropriately. Mentions some dyspnea. Denies any chest pain, productive sputum, fevers. HR in 30s. Bp stable w/ systolic above 173 during my evaluation. Significant wheezing on exam. Noted to be due for her dexamethasone, Combinvent and Incruse. Advised to provide treatment and monitor.  - C/w COPD tx w/ dexamethasone, Combinvent, Incruse - Monitor bp - Holding remdesivir per cardiology

## 2021-06-12 NOTE — Progress Notes (Signed)
Patient denies any lightheadedness, reports cough and dry mouth only Dopamine has been intermittently titrated through the morning, at 15currently NO CP She is AAO SBP 109 HR 38  She is next case on the EP board

## 2021-06-12 NOTE — ED Notes (Addendum)
Pt resting comfortably at this time. Pt currently denies CP/SOB/lightheadedness at present. Respirations even/unlabored.

## 2021-06-12 NOTE — ED Notes (Signed)
Notified Lee MD about pt work of breathing not any better and that she has audible wheezing at this time. Awaiting response.

## 2021-06-12 NOTE — ED Notes (Signed)
Cath lab staff at bedside to transport pt.

## 2021-06-12 NOTE — ED Notes (Signed)
Pt called out to nurses station c/o CP. Notified Lee MD. Pt w/ audible wheezing after coughing. Describing pain as pressure 8/10.

## 2021-06-12 NOTE — ED Notes (Signed)
Cardiologist at bedside at this time.

## 2021-06-12 NOTE — ED Notes (Signed)
Internal Medicine providers at bedside at this time.

## 2021-06-12 NOTE — ED Notes (Signed)
Pt resting at this time. VS WNL. Visible chest rise and fall noted. Pt appears in NAD.  

## 2021-06-12 NOTE — ED Notes (Signed)
Lee MD on his way to pt

## 2021-06-12 NOTE — ED Notes (Addendum)
Paged internal medicine teaching service residents to notify of need for bed change for cardiac progressive and not medical progressive per the [facility]. Also secure chatted Lee MD after no response to page. Notified MD about pt HR mid 20's to low 30's and about BP 106/47 w/ MAP of 66. Clarifying w/ MD about if pt is going to be paced or not at this time.

## 2021-06-12 NOTE — ED Notes (Signed)
Md notified of pts bp in the 90s

## 2021-06-12 NOTE — ED Notes (Signed)
Report given to [place]. Per 6E RN, pt unable to come to floor until after pacemaker placement due to dopamine gtt.

## 2021-06-12 NOTE — ED Notes (Signed)
Per cardiologist, start pt on dopamine gtt.

## 2021-06-12 NOTE — ED Notes (Signed)
RN informed md Ngyuen of pts soft bp of 90/51 md informed rn bp is fine if map >60

## 2021-06-12 NOTE — ED Notes (Addendum)
Per Truman Hayward MD, if pt SBP is < 90 or pt becomes altered, plan is to give atropine and start pacing pt. Will continue to monitor.

## 2021-06-12 NOTE — H&P (View-Only) (Signed)
Progress Note  Patient Name: Rhonda Santos Date of Encounter: 06/12/2021  [facility] Cardiologist: Dr. Gardiner Rhyme (at last hospitalization)  Subjective   I am breathing easier, denies any CP no lightheadness, dizziness at rest, still waiting in the ER on a bed  Inpatient Medications    Scheduled Meds:  atorvastatin  40 mg Oral Daily   dexamethasone  6 mg Oral Q24H   DULoxetine  60 mg Oral QAC lunch   enoxaparin (LOVENOX) injection  40 mg Subcutaneous Daily   insulin aspart  0-20 Units Subcutaneous TID WC   insulin aspart  6 Units Subcutaneous TID WC   Ipratropium-Albuterol  1 puff Inhalation Q6H   levothyroxine  100 mcg Oral QAC breakfast   pantoprazole  40 mg Oral Daily   senna  1 tablet Oral BID   umeclidinium bromide  1 puff Inhalation Daily   Continuous Infusions:  PRN Meds: clonazePAM, gabapentin, guaiFENesin-dextromethorphan, traMADol   Vital Signs    Vitals:   06/12/21 0800 06/12/21 0815 06/12/21 0830 06/12/21 0845  BP: (!) 107/43 (!) 106/47 (!) 111/45 (!) 116/44  Pulse:  (!) 34 (!) 27 (!) 31  Resp: 12 14 15 15   Temp:      TempSrc:      SpO2: 98% 100% 98% 99%    Intake/Output Summary (Last 24 hours) at 06/12/2021 0901 Last data filed at 06/12/2021 0806 Gross per 24 hour  Intake 636.37 ml  Output --  Net 636.37 ml   Last 3 Weights 09/29/2020 09/01/2020 08/31/2020  Weight (lbs) 190 lb 12.8 oz 197 lb 12 oz 196 lb 6.9 oz  Weight (kg) 86.546 kg 89.7 kg 89.1 kg  Some encounter information is confidential and restricted. Go to Review Flowsheets activity to see all data.      Telemetry    Mobitz II, 2:1 and 3:1 conduction, rates mostly 30's, some dip to 28-29 transiently - Personally Reviewed  ECG    No new EKgs - Personally Reviewed  Physical Exam   GEN: No acute distress.   Neck: No JVD Cardiac: RRR, bradycardic, no murmurs, rubs, or gallops.  Respiratory: less wheezing today. GI: Soft, nontender, non-distended  MS: No edema; No  deformity. Neuro:  Nonfocal  Psych: Normal affect   Labs    High Sensitivity Troponin:   Recent Labs  Lab 06/11/21 0724 06/11/21 0850  TROPONINIHS 26* 30*      Chemistry Recent Labs  Lab 06/11/21 0724 06/12/21 0212  NA 133* 134*  K 4.7 4.5  CL 98 96*  CO2 27 24  GLUCOSE 146* 192*  BUN 23 29*  CREATININE 1.23* 1.66*  CALCIUM 9.2 9.1  PROT 6.8 6.6  ALBUMIN 3.1* 2.9*  AST 23 20  ALT 17 15  ALKPHOS 90 82  BILITOT 0.9 0.7  GFRNONAA 46* 32*  ANIONGAP 8 14     Hematology Recent Labs  Lab 06/11/21 0724 06/12/21 0212  WBC 9.5 6.2  RBC 3.47* 3.49*  HGB 10.8* 11.0*  HCT 32.7* 33.3*  MCV 94.2 95.4  MCH 31.1 31.5  MCHC 33.0 33.0  RDW 14.0 14.1  PLT 215 182    BNP Recent Labs  Lab 06/11/21 0724  BNP 592.6*     DDimer  Recent Labs  Lab 06/11/21 1205 06/12/21 0212  DDIMER 0.54* 0.40     Radiology    DG Chest Portable 1 View  Result Date: 06/11/2021 CLINICAL DATA:  Shortness of breath. EXAM: PORTABLE CHEST 1 VIEW COMPARISON:  08/31/2020. FINDINGS: Mildly enlarged  cardiac silhouette, similar to priors. Linear left basilar opacities, similar to priors. Chronic medial right basilar opacity correlates with prominent diaphragmatic/mediastinal fat on prior CT. No new consolidation. No visible pneumothorax or pleural effusion. No acute osseous abnormality. IMPRESSION: 1. Linear left basilar opacities, similar to priors and most likely atelectasis/scar. 2. Similar cardiomegaly. Electronically Signed   By: Margaretha Sheffield MD   On: 06/11/2021 07:30    Cardiac Studies   Oct 2021: monitor Sinus rhythm with first degree AV block Rare premature atrial contractions and premature ventricular contractions Nonsustained atrial tachycardia is noted No afib No AV block or pauses Baseline artifact limits interpretation at times  08/31/21: TTE IMPRESSIONS   1. Left ventricular ejection fraction, by estimation, is 60 to 65%. The  left ventricle has normal function. The  left ventricle has no regional  wall motion abnormalities. There is moderate left ventricular hypertrophy.  Left ventricular diastolic  parameters are consistent with Grade II diastolic dysfunction  (pseudonormalization).   2. Right ventricular systolic function is normal. The right ventricular  size is normal. There is normal pulmonary artery systolic pressure.   3. Left atrial size was mildly dilated.   4. A small pericardial effusion is present.   5. The mitral valve is normal in structure. Trivial mitral valve  regurgitation. No evidence of mitral stenosis.   6. The aortic valve is tricuspid. There is moderate calcification of the  aortic valve. There is mild thickening of the aortic valve. Aortic valve  regurgitation is trivial. Mild aortic valve sclerosis is present, with no  evidence of aortic valve  stenosis.   7. The inferior vena cava is normal in size with greater than 50%  respiratory variability, suggesting right atrial pressure of 3 mmHg.   Comparison(s): No significant change from prior study.    Patient Profile     75 y.o. 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, she has had some 1:1 conduction here as well yesterday, though has settled in [place] II AVBLock, rates generally about 30 with 2:1 and 3:1  SBP 90's-110's, had losartan yesterday yesterday afternoon lower BPs after lasix and losartan, got some fluids and has been better  Her last dose of lopressor 25mg  was 06/10/21 about 10:00AM now is 48 hours since last dose She remains with advanced heart block today and will likely need pacing  Creat is up some  Historically had the same that did improve off her BB and the patient had wanted to  avoid pacing, though was resumed outpatient for HTN   NPO after breakfast Will d/w Dr. Rayann Heman and cath lab pacer/timing   ADDEND: Dr. Joette Catching THE PATIENT earlier this AM, APPEARED WEAK AND bp LOW, STARTED ON DOPAMINE WITH PLANS FOR [place]. I came back to check on her medicine team is at bedside and in d/w him. HR better with Bbigemenal rhythm rates 40-45 and last SBP on dopamine was 741 systolic. Pt respiratory status though he feels is worse felt 2/2 COPD/COVID with increased wheezing and work of breathing > IV steroids I have updated Dr. Rayann Heman Planned for PPM this afternoon  Addend: Spoke with RN wheezing is improved Reviewed telemetry tracing with cards DOD and reviewed clinic scenario Agrees remains in heart  block, morphology a bit different/ectopy Given BP is improved with dopamine would not change plan or pursue temp wire just yet, defer respiratory management to IM    2. COVID + Has felt poorly for a few weeks with worsened SOB Multiple comborbidities COPD DM, obesity IM team is managing  D/w them, respiratory stable, on minimal O2 requirements, stopped remdesivir 2/2 bradycardia  For questions or updates, please contact [facility] Please consult [email or web address] for contact info under        Signed, Baldwin Jamaica, PA-C  06/12/2021, 9:01 AM     I have seen, examined the patient, and reviewed the above assessment and plan.  Changes to above are made where necessary.  On exam, ill appearing.  Lethargic.  Bradycardic. Pt with advanced heart block and covid.  No reversible causes are found.  She is quite ill.  We have started dopamine for improvement in bradycardia. The patient has symptomatic bradycardia.  I would therefore recommend pacemaker implantation.  Risks, benefits, alternatives to pacemaker implantation were discussed in detail with the patient today. The patient understands that the risks include but are not limited to bleeding, infection,  pneumothorax, perforation, tamponade, vascular damage, renal failure, MI, stroke, death,  and lead dislodgement and wishes to proceed. We will therefore schedule the procedure at the next available time.      Co Sign: Thompson Grayer, MD 06/12/2021 12:25 PM

## 2021-06-12 NOTE — ED Notes (Signed)
Pt A&Ox4, asymptomatic at this time. Crackles to bilateral lungs, radial and pedal pulses moderate 2+. BP 117/47 (65), HR 25, 99% on 2L. Pt in NAD at this time.

## 2021-06-12 NOTE — ED Notes (Signed)
Pt called out to nurses station c/o feeling like she can't breathe. Came into pt room and pt is A&Ox4, appears to be distressed/uncomfortable in bed. O2 is 97% on 2L. HR 35, RR 27, 98% RA, BP unattainable at this time. Will get manual BP. Attendings paged and secure chatted.

## 2021-06-12 NOTE — ED Notes (Signed)
Cards at bedside at this time. Plan is NPO after breakfast and pacemaker placement this afternoon.

## 2021-06-12 NOTE — Progress Notes (Signed)
2:30 AM RN notified that blood pressure is soft 93/42 with MAP 58.  When evaluated bedside, patient appears comfortable in no acute respiratory distress.  She said that she is feeling fine and trying to sleep.  Denies chest pain, shortness of breath, dizziness.  Patient is able to tell that she is in the hospital.  Repeat blood pressure in the room 107/43 with MAP of 63.  Heart rate 36.  She has received 40 mg of IV Lasix and losartan 50 mg this afternoon.  She got 500 cc of urine output  Patient is clinically stable and asymptomatic with her hypotension.  Given no LE edema, no dyspnea or evidence of volume overload on exam, will give 500 cc of LR bolus and recheck blood pressure.  Goal MAP greater than 60.  Will hold losartan and Lasix until blood pressure improves.  For her asymptomatic bradycardia, EP recommended pacemaker placement.  Continue holding beta-blocker.

## 2021-06-12 NOTE — ED Notes (Signed)
Breakfast Orders placed 

## 2021-06-12 NOTE — ED Notes (Addendum)
Per Truman Hayward MD. No pacing pt at this time. Asked what the plan is and what parameters are in place as to when to start pacing pt. Awaiting response.

## 2021-06-12 NOTE — ED Notes (Signed)
Truman Hayward MD at bedside. Per Truman Hayward MD inhalers will keep pt's heart rate up as well as BP. Pt w/ some improvement and feeling less shob, but still shob w/ slight labored breathing. HR remains 20's to 30's. O2 97% on 2L BP 119/37 (60). Will continue to monitor pt.

## 2021-06-12 NOTE — Progress Notes (Addendum)
Progress Note  Patient Name: Rhonda Santos Date of Encounter: 06/12/2021  [facility] Cardiologist: Dr. Gardiner Rhyme (at last hospitalization)  Subjective   I am breathing easier, denies any CP no lightheadness, dizziness at rest, still waiting in the ER on a bed  Inpatient Medications    Scheduled Meds:  atorvastatin  40 mg Oral Daily   dexamethasone  6 mg Oral Q24H   DULoxetine  60 mg Oral QAC lunch   enoxaparin (LOVENOX) injection  40 mg Subcutaneous Daily   insulin aspart  0-20 Units Subcutaneous TID WC   insulin aspart  6 Units Subcutaneous TID WC   Ipratropium-Albuterol  1 puff Inhalation Q6H   levothyroxine  100 mcg Oral QAC breakfast   pantoprazole  40 mg Oral Daily   senna  1 tablet Oral BID   umeclidinium bromide  1 puff Inhalation Daily   Continuous Infusions:  PRN Meds: clonazePAM, gabapentin, guaiFENesin-dextromethorphan, traMADol   Vital Signs    Vitals:   06/12/21 0800 06/12/21 0815 06/12/21 0830 06/12/21 0845  BP: (!) 107/43 (!) 106/47 (!) 111/45 (!) 116/44  Pulse:  (!) 34 (!) 27 (!) 31  Resp: 12 14 15 15   Temp:      TempSrc:      SpO2: 98% 100% 98% 99%    Intake/Output Summary (Last 24 hours) at 06/12/2021 0901 Last data filed at 06/12/2021 0806 Gross per 24 hour  Intake 636.37 ml  Output --  Net 636.37 ml   Last 3 Weights 09/29/2020 09/01/2020 08/31/2020  Weight (lbs) 190 lb 12.8 oz 197 lb 12 oz 196 lb 6.9 oz  Weight (kg) 86.546 kg 89.7 kg 89.1 kg  Some encounter information is confidential and restricted. Go to Review Flowsheets activity to see all data.      Telemetry    Mobitz II, 2:1 and 3:1 conduction, rates mostly 30's, some dip to 28-29 transiently - Personally Reviewed  ECG    No new EKgs - Personally Reviewed  Physical Exam   GEN: No acute distress.   Neck: No JVD Cardiac: RRR, bradycardic, no murmurs, rubs, or gallops.  Respiratory: less wheezing today. GI: Soft, nontender, non-distended  MS: No edema; No  deformity. Neuro:  Nonfocal  Psych: Normal affect   Labs    High Sensitivity Troponin:   Recent Labs  Lab 06/11/21 0724 06/11/21 0850  TROPONINIHS 26* 30*      Chemistry Recent Labs  Lab 06/11/21 0724 06/12/21 0212  NA 133* 134*  K 4.7 4.5  CL 98 96*  CO2 27 24  GLUCOSE 146* 192*  BUN 23 29*  CREATININE 1.23* 1.66*  CALCIUM 9.2 9.1  PROT 6.8 6.6  ALBUMIN 3.1* 2.9*  AST 23 20  ALT 17 15  ALKPHOS 90 82  BILITOT 0.9 0.7  GFRNONAA 46* 32*  ANIONGAP 8 14     Hematology Recent Labs  Lab 06/11/21 0724 06/12/21 0212  WBC 9.5 6.2  RBC 3.47* 3.49*  HGB 10.8* 11.0*  HCT 32.7* 33.3*  MCV 94.2 95.4  MCH 31.1 31.5  MCHC 33.0 33.0  RDW 14.0 14.1  PLT 215 182    BNP Recent Labs  Lab 06/11/21 0724  BNP 592.6*     DDimer  Recent Labs  Lab 06/11/21 1205 06/12/21 0212  DDIMER 0.54* 0.40     Radiology    DG Chest Portable 1 View  Result Date: 06/11/2021 CLINICAL DATA:  Shortness of breath. EXAM: PORTABLE CHEST 1 VIEW COMPARISON:  08/31/2020. FINDINGS: Mildly enlarged  cardiac silhouette, similar to priors. Linear left basilar opacities, similar to priors. Chronic medial right basilar opacity correlates with prominent diaphragmatic/mediastinal fat on prior CT. No new consolidation. No visible pneumothorax or pleural effusion. No acute osseous abnormality. IMPRESSION: 1. Linear left basilar opacities, similar to priors and most likely atelectasis/scar. 2. Similar cardiomegaly. Electronically Signed   By: Margaretha Sheffield MD   On: 06/11/2021 07:30    Cardiac Studies   Oct 2021: monitor Sinus rhythm with first degree AV block Rare premature atrial contractions and premature ventricular contractions Nonsustained atrial tachycardia is noted No afib No AV block or pauses Baseline artifact limits interpretation at times  08/31/21: TTE IMPRESSIONS   1. Left ventricular ejection fraction, by estimation, is 60 to 65%. The  left ventricle has normal function. The  left ventricle has no regional  wall motion abnormalities. There is moderate left ventricular hypertrophy.  Left ventricular diastolic  parameters are consistent with Grade II diastolic dysfunction  (pseudonormalization).   2. Right ventricular systolic function is normal. The right ventricular  size is normal. There is normal pulmonary artery systolic pressure.   3. Left atrial size was mildly dilated.   4. A small pericardial effusion is present.   5. The mitral valve is normal in structure. Trivial mitral valve  regurgitation. No evidence of mitral stenosis.   6. The aortic valve is tricuspid. There is moderate calcification of the  aortic valve. There is mild thickening of the aortic valve. Aortic valve  regurgitation is trivial. Mild aortic valve sclerosis is present, with no  evidence of aortic valve  stenosis.   7. The inferior vena cava is normal in size with greater than 50%  respiratory variability, suggesting right atrial pressure of 3 mmHg.   Comparison(s): No significant change from prior study.    Patient Profile     75 y.o. 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, she has had some 1:1 conduction here as well yesterday, though has settled in [place] II AVBLock, rates generally about 30 with 2:1 and 3:1  SBP 90's-110's, had losartan yesterday yesterday afternoon lower BPs after lasix and losartan, got some fluids and has been better  Her last dose of lopressor 25mg  was 06/10/21 about 10:00AM now is 48 hours since last dose She remains with advanced heart block today and will likely need pacing  Creat is up some  Historically had the same that did improve off her BB and the patient had wanted to  avoid pacing, though was resumed outpatient for HTN   NPO after breakfast Will d/w Dr. Rayann Heman and cath lab pacer/timing   ADDEND: Dr. Joette Catching THE PATIENT earlier this AM, APPEARED WEAK AND bp LOW, STARTED ON DOPAMINE WITH PLANS FOR [place]. I came back to check on her medicine team is at bedside and in d/w him. HR better with Bbigemenal rhythm rates 40-45 and last SBP on dopamine was 347 systolic. Pt respiratory status though he feels is worse felt 2/2 COPD/COVID with increased wheezing and work of breathing > IV steroids I have updated Dr. Rayann Heman Planned for PPM this afternoon  Addend: Spoke with RN wheezing is improved Reviewed telemetry tracing with cards DOD and reviewed clinic scenario Agrees remains in heart  block, morphology a bit different/ectopy Given BP is improved with dopamine would not change plan or pursue temp wire just yet, defer respiratory management to IM    2. COVID + Has felt poorly for a few weeks with worsened SOB Multiple comborbidities COPD DM, obesity IM team is managing  D/w them, respiratory stable, on minimal O2 requirements, stopped remdesivir 2/2 bradycardia  For questions or updates, please contact [place] [facility] Please consult [email or web address] for contact info under        Signed, Baldwin Jamaica, PA-C  06/12/2021, 9:01 AM     I have seen, examined the patient, and reviewed the above assessment and plan.  Changes to above are made where necessary.  On exam, ill appearing.  Lethargic.  Bradycardic. Pt with advanced heart block and covid.  No reversible causes are found.  She is quite ill.  We have started dopamine for improvement in bradycardia. The patient has symptomatic bradycardia.  I would therefore recommend pacemaker implantation.  Risks, benefits, alternatives to pacemaker implantation were discussed in detail with the patient today. The patient understands that the risks include but are not limited to bleeding, infection,  pneumothorax, perforation, tamponade, vascular damage, renal failure, MI, stroke, death,  and lead dislodgement and wishes to proceed. We will therefore schedule the procedure at the next available time.      Co Sign: Thompson Grayer, MD 06/12/2021 12:25 PM

## 2021-06-12 NOTE — ED Notes (Signed)
Unable to obtain automatic BP's. Multiple attempts unsuccessful. Manual BP cuff remains at bedside. Obtaining manual BP at this time.

## 2021-06-12 NOTE — Progress Notes (Signed)
Subjective:   Hospital day:1  Overnight event: Patient became hypotensive overnight was given 500 cc LR bolus.  Patient evaluated while in the ED laying comfortably in bed with heart rate in the high 20s and 30s. Patient reports her breathing has improved compared to yesterday. She denied any fever, cough, chest pain, headaches or dizziness. Patient reports she mostly stays at home so she was surprised that she caught COVID.  She also reports that her low heart rate is new.  Objective:  Vital signs in last 24 hours: Vitals:   06/12/21 0545 06/12/21 0615 06/12/21 0630 06/12/21 0645  BP: (!) 107/91 (!) 118/43 (!) 114/33 (!) 106/48  Pulse: (!) 31 (!) 35 (!) 34 (!) 30  Resp: 11 19 19 17   Temp:      TempSrc:      SpO2: 98% 99% 99% 98%    There were no vitals filed for this visit.   Intake/Output Summary (Last 24 hours) at 06/12/2021 0656 Last data filed at 06/11/2021 1916 Gross per 24 hour  Intake 591.58 ml  Output --  Net 591.58 ml   Net IO Since Admission: 591.58 mL [06/12/21 0656]  Recent Labs    06/11/21 1201 06/11/21 1711 06/11/21 2143  GLUCAP 136* 226* 223*     Pertinent Labs: CBC Latest Ref Rng & Units 06/12/2021 06/11/2021 09/01/2020  WBC 4.0 - 10.5 K/uL 6.2 9.5 6.7  Hemoglobin 12.0 - 15.0 g/dL 11.0(L) 10.8(L) 8.2(L)  Hematocrit 36.0 - 46.0 % 33.3(L) 32.7(L) 28.0(L)  Platelets 150 - 400 K/uL 182 215 254    CMP Latest Ref Rng & Units 06/12/2021 06/11/2021 09/01/2020  Glucose 70 - 99 mg/dL 192(H) 146(H) 113(H)  BUN 8 - 23 mg/dL 29(H) 23 8  Creatinine 0.44 - 1.00 mg/dL 1.66(H) 1.23(H) 1.16(H)  Sodium 135 - 145 mmol/L 134(L) 133(L) 141  Potassium 3.5 - 5.1 mmol/L 4.5 4.7 3.8  Chloride 98 - 111 mmol/L 96(L) 98 106  CO2 22 - 32 mmol/L 24 27 25   Calcium 8.9 - 10.3 mg/dL 9.1 9.2 8.6(L)  Total Protein 6.5 - 8.1 g/dL 6.6 6.8 6.5  Total Bilirubin 0.3 - 1.2 mg/dL 0.7 0.9 0.3  Alkaline Phos 38 - 126 U/L 82 90 96  AST 15 - 41 U/L 20 23 18   ALT 0 - 44 U/L 15 17 16      Imaging: DG Chest Portable 1 View  Result Date: 06/11/2021 CLINICAL DATA:  Shortness of breath. EXAM: PORTABLE CHEST 1 VIEW COMPARISON:  08/31/2020. FINDINGS: Mildly enlarged cardiac silhouette, similar to priors. Linear left basilar opacities, similar to priors. Chronic medial right basilar opacity correlates with prominent diaphragmatic/mediastinal fat on prior CT. No new consolidation. No visible pneumothorax or pleural effusion. No acute osseous abnormality. IMPRESSION: 1. Linear left basilar opacities, similar to priors and most likely atelectasis/scar. 2. Similar cardiomegaly. Electronically Signed   By: Margaretha Sheffield MD   On: 06/11/2021 07:30    Physical Exam  General: Pleasant, well-appearing elderly woman laying in bed. No acute distress. Head: Normocephalic. Atraumatic. CV: Bradycardic. Regular rhythm. No m/r/g. Trace BLE edema to ankles.  Pulmonary: On 1LNC. Lungs CTAB. Normal effort. Mild expiratory wheezes throughout. No increased WOB. Abdominal: Soft, nontender, nondistended. Normal bowel sounds. Extremities: 2+ distal pulses. Normal ROM. Skin: Warm and dry. No obvious rash or lesions. Neuro: A&Ox3. No focal deficts Psych: Normal mood and affect   Assessment/Plan: Rhonda Santos is a 75 y.o. female with hx of DM, HFpEF, Chronic pain on tramadol, COPD, HTN, Post-surgical  hypothyroidism, HLD, HTN presenting w/ worsening shortness of breath and found to have acute hypoxic respiratory failure 2/2 COVID pneumonia and bradycardia.   Active Problems:   Pneumonia due to COVID-19 virus   COVID-19  Acute hypoxic respiratory failure 2/2 COVID pneumonia COPD exacerbation Acute on chronic diastolic heart failure Patient's respiratory status improving.  She continues to have expiratory wheezes on exam. I was able turned down her O2 from 2 L to 1 L and patient able to maintain O2 sat above 95%. Inflammatory markers remain within normal limits.  Magnesium slightly low at 1.6.  Discontinued remdesivir due to worsening bradycardia. --Repleting magnesium --Continue dexamethasone 6 mg (day 2/10) --Discontinue remdesivir --Continue Incruse, prn Combivent --O2 prn to keep >90 --Trend inflammatory markers --SSI while on steroids   Symptomatic 1st Degree Heart Block Troponinemia Patient found to be bradycardic with heart rate in the 30s. EKG showed first-degree AV block. Patient remains asymptomatic. Mild increase in troponin from 38 to 55 today. Likely demand ischemia in the setting of bradycardia and COVID infection. No ST/T wave changes on EKG. --Cardiology (EP) following, appreciate recs --Plan for pacemaker today --Continue to hold home Lopressor --Telemetry  Hypotension Patient became hypotensive with systolic in the 74Q to 595G overnight.  Patient was asymptomatic on evaluation by MD. Patient was given 500 cc LR bolus.  SBP has remained in the 100s-140s throughout the day. --Continue to monitor closely --Continue to hold antihypertensives   Post-surgical hypothyroidism Prior hx of thyroidectomy for Graves Disease. Admission TSH of 7.67 but free T4 and T3 remain within normal limits.   --Increase levothyroxine on dose to 150 mcg   T2DM On metformin, glimepiride at home. CBGs in the 120s to 170s while on steroid therapy.  --NovoLog 6 units 3 times daily with meals --Continue SSI, resistant --Continue CBG checks   Iron deficiency Anemia Prior hx of iron deficiency anemia. Prior hgb ~8.2-8.4. Hemoglobin remained stable at 11.0 today. --Daily CBC   Anxiety/Depression Chronic and stable.   --Continue clonazepam 0.5 mg daily prn for anxiety --Continue on Cymbalta 60 mg daily before lunch   Chronic pain syndrome --Continue home gabapentin and tramadol --Start Norco 5-325 mg q4h prn for moderate pain   Diet: HH/CM IVF: None VTE: SQ Lovenox CODE: DNR  Prior to Admission Living Arrangement: Home Anticipated Discharge Location: Home Barriers to  Discharge: Pending medical work-up Dispo: Anticipated discharge in approximately 2-3 day(s).   Signed: Lacinda Axon, MD 06/12/2021, 6:56 AM  Pager: [phone] Internal Medicine Teaching Service After 5pm on weekdays and 1pm on weekends: On Call pager: [phone]

## 2021-06-13 ENCOUNTER — Inpatient Hospital Stay (HOSPITAL_COMMUNITY): Payer: Medicare PPO

## 2021-06-13 ENCOUNTER — Encounter (HOSPITAL_COMMUNITY): Payer: Self-pay | Admitting: Internal Medicine

## 2021-06-13 LAB — CBC WITH DIFFERENTIAL/PLATELET
Abs Immature Granulocytes: 0.06 10*3/uL (ref 0.00–0.07)
Basophils Absolute: 0 10*3/uL (ref 0.0–0.1)
Basophils Relative: 0 %
Eosinophils Absolute: 0 10*3/uL (ref 0.0–0.5)
Eosinophils Relative: 0 %
HCT: 29.7 % — ABNORMAL LOW (ref 36.0–46.0)
Hemoglobin: 9.8 g/dL — ABNORMAL LOW (ref 12.0–15.0)
Immature Granulocytes: 1 %
Lymphocytes Relative: 12 %
Lymphs Abs: 1.2 10*3/uL (ref 0.7–4.0)
MCH: 31.1 pg (ref 26.0–34.0)
MCHC: 33 g/dL (ref 30.0–36.0)
MCV: 94.3 fL (ref 80.0–100.0)
Monocytes Absolute: 0.9 10*3/uL (ref 0.1–1.0)
Monocytes Relative: 9 %
Neutro Abs: 8.3 10*3/uL — ABNORMAL HIGH (ref 1.7–7.7)
Neutrophils Relative %: 78 %
Platelets: 223 10*3/uL (ref 150–400)
RBC: 3.15 MIL/uL — ABNORMAL LOW (ref 3.87–5.11)
RDW: 14.4 % (ref 11.5–15.5)
WBC: 10.5 10*3/uL (ref 4.0–10.5)
nRBC: 0 % (ref 0.0–0.2)

## 2021-06-13 LAB — GLUCOSE, CAPILLARY
Glucose-Capillary: 150 mg/dL — ABNORMAL HIGH (ref 70–99)
Glucose-Capillary: 132 mg/dL — ABNORMAL HIGH (ref 70–99)
Glucose-Capillary: 193 mg/dL — ABNORMAL HIGH (ref 70–99)
Glucose-Capillary: 165 mg/dL — ABNORMAL HIGH (ref 70–99)

## 2021-06-13 LAB — COMPREHENSIVE METABOLIC PANEL
ALT: 15 U/L (ref 0–44)
AST: 22 U/L (ref 15–41)
Albumin: 2.8 g/dL — ABNORMAL LOW (ref 3.5–5.0)
Alkaline Phosphatase: 72 U/L (ref 38–126)
Anion gap: 12 (ref 5–15)
BUN: 37 mg/dL — ABNORMAL HIGH (ref 8–23)
CO2: 26 mmol/L (ref 22–32)
Calcium: 8.6 mg/dL — ABNORMAL LOW (ref 8.9–10.3)
Chloride: 92 mmol/L — ABNORMAL LOW (ref 98–111)
Creatinine, Ser: 1.51 mg/dL — ABNORMAL HIGH (ref 0.44–1.00)
GFR, Estimated: 36 mL/min — ABNORMAL LOW (ref >60)
Glucose, Bld: 138 mg/dL — ABNORMAL HIGH (ref 70–99)
Potassium: 4.7 mmol/L (ref 3.5–5.1)
Sodium: 130 mmol/L — ABNORMAL LOW (ref 135–145)
Total Bilirubin: 0.9 mg/dL (ref 0.3–1.2)
Total Protein: 6.1 g/dL — ABNORMAL LOW (ref 6.5–8.1)

## 2021-06-13 LAB — MAGNESIUM: Magnesium: 2.2 mg/dL (ref 1.7–2.4)

## 2021-06-13 LAB — PHOSPHORUS: Phosphorus: 4.1 mg/dL (ref 2.5–4.6)

## 2021-06-13 LAB — FERRITIN: Ferritin: 94 ng/mL (ref 11–307)

## 2021-06-13 LAB — C-REACTIVE PROTEIN: CRP: 8.8 mg/dL — ABNORMAL HIGH (ref <1.0)

## 2021-06-13 LAB — D-DIMER, QUANTITATIVE: D-Dimer, Quant: 12.63 ug/mL-FEU — ABNORMAL HIGH (ref 0.00–0.50)

## 2021-06-13 NOTE — Progress Notes (Signed)
Progress Note  Patient Name: Rhonda Santos Date of Encounter: 06/13/2021  [facility] Cardiologist: Dr. Gardiner Rhyme (at last hospitalization)  Subjective   Feeling better, coughing occasionally still though not as SOB, no CP  Inpatient Medications    Scheduled Meds:  atorvastatin  40 mg Oral Daily   dexamethasone  6 mg Oral Q24H   DULoxetine  60 mg Oral QAC lunch   insulin aspart  0-20 Units Subcutaneous TID WC   insulin aspart  6 Units Subcutaneous TID WC   Ipratropium-Albuterol  1 puff Inhalation Q6H   levothyroxine  150 mcg Oral QAC breakfast   melatonin  3 mg Oral QHS   nicotine  21 mg Transdermal Daily   pantoprazole  40 mg Oral Daily   senna  1 tablet Oral BID   sodium chloride flush  3 mL Intravenous Q12H   sodium chloride flush  3 mL Intravenous Q12H   umeclidinium bromide  1 puff Inhalation Daily   Continuous Infusions:  sodium chloride     PRN Meds: sodium chloride, acetaminophen, clonazePAM, gabapentin, guaiFENesin-dextromethorphan, HYDROcodone-acetaminophen, sodium chloride flush, traMADol   Vital Signs    Vitals:   06/12/21 2023 06/13/21 0047 06/13/21 0456 06/13/21 0500  BP: (!) 121/47 (!) 117/49 (!) 107/50   Pulse: 73 70 60   Resp: 19 17    Temp: 98.6 F (37 C) 98.4 F (36.9 C) 98.5 F (36.9 C)   TempSrc: Oral Oral Oral   SpO2: 97% 100%    Weight:    84.1 kg  Height:        Intake/Output Summary (Last 24 hours) at 06/13/2021 0924 Last data filed at 06/13/2021 0600 Gross per 24 hour  Intake 471.1 ml  Output --  Net 471.1 ml   Last 3 Weights 06/13/2021 06/12/2021 09/29/2020  Weight (lbs) 185 lb 6.4 oz 190 lb 11.2 oz 190 lb 12.8 oz  Weight (kg) 84.097 kg 86.5 kg 86.546 kg  Some encounter information is confidential and restricted. Go to Review Flowsheets activity to see all data.      Telemetry    AV pacig - Personally Reviewed  ECG    AV paced - Personally Reviewed  Physical Exam   Patient was not examined today She appears  comfortable, in NAD, site/dressing is clean and dry  Labs    High Sensitivity Troponin:   Recent Labs  Lab 06/11/21 0724 06/11/21 0850 06/12/21 1204 06/12/21 1448  TROPONINIHS 26* 30* 38* 55*      Chemistry Recent Labs  Lab 06/11/21 0724 06/12/21 0212 06/13/21 0259  NA 133* 134* 130*  K 4.7 4.5 4.7  CL 98 96* 92*  CO2 27 24 26   GLUCOSE 146* 192* 138*  BUN 23 29* 37*  CREATININE 1.23* 1.66* 1.51*  CALCIUM 9.2 9.1 8.6*  PROT 6.8 6.6 6.1*  ALBUMIN 3.1* 2.9* 2.8*  AST 23 20 22   ALT 17 15 15   ALKPHOS 90 82 72  BILITOT 0.9 0.7 0.9  GFRNONAA 46* 32* 36*  ANIONGAP 8 14 12      Hematology Recent Labs  Lab 06/11/21 0724 06/12/21 0212 06/13/21 0259  WBC 9.5 6.2 10.5  RBC 3.47* 3.49* 3.15*  HGB 10.8* 11.0* 9.8*  HCT 32.7* 33.3* 29.7*  MCV 94.2 95.4 94.3  MCH 31.1 31.5 31.1  MCHC 33.0 33.0 33.0  RDW 14.0 14.1 14.4  PLT 215 182 223    BNP Recent Labs  Lab 06/11/21 0724  BNP 592.6*     DDimer  Recent  Labs  Lab 06/11/21 1205 06/12/21 0212 06/13/21 0259  DDIMER 0.54* 0.40 12.63*     Radiology    DG Chest 2 View Result Date: 06/13/2021 CLINICAL DATA:  Cardiac device in situ. EXAM: CHEST - 2 VIEW COMPARISON:  Prior chest x-ray same day. FINDINGS: Cardiac pacer noted with lead tips over the right atrium and right ventricle. Cardiomegaly. Bilateral interstitial prominence suggesting interstitial edema. Small right pleural effusion. No pneumothorax. IMPRESSION: 1. Cardiac pacer noted with lead tips over the right atrium and right ventricle. No pneumothorax. 2. Bilateral interstitial prominence suggesting interstitial edema. Small right pleural effusion. Electronically Signed   By: Marcello Moores  Register   On: 06/13/2021 06:56    Cardiac Studies   Oct 2021: monitor Sinus rhythm with first degree AV block Rare premature atrial contractions and premature ventricular contractions Nonsustained atrial tachycardia is noted No afib No AV block or pauses Baseline  artifact limits interpretation at times  08/31/21: TTE IMPRESSIONS   1. Left ventricular ejection fraction, by estimation, is 60 to 65%. The  left ventricle has normal function. The left ventricle has no regional  wall motion abnormalities. There is moderate left ventricular hypertrophy.  Left ventricular diastolic  parameters are consistent with Grade II diastolic dysfunction  (pseudonormalization).   2. Right ventricular systolic function is normal. The right ventricular  size is normal. There is normal pulmonary artery systolic pressure.   3. Left atrial size was mildly dilated.   4. A small pericardial effusion is present.   5. The mitral valve is normal in structure. Trivial mitral valve  regurgitation. No evidence of mitral stenosis.   6. The aortic valve is tricuspid. There is moderate calcification of the  aortic valve. There is mild thickening of the aortic valve. Aortic valve  regurgitation is trivial. Mild aortic valve sclerosis is present, with no  evidence of aortic valve  stenosis.   7. The inferior vena cava is normal in size with greater than 50%  respiratory variability, suggesting right atrial pressure of 3 mmHg.   Comparison(s): No significant change from prior study.    Patient Profile     75 y.o. 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, she has had some 1:1 conduction here as well yesterday, though has settled in [place], rates generally about 30 with 2:1 and 3:1   She is now s/p PPM yesterday I have briefly seen the patient, site looks good, dressing is dry, no hematoma I have called the patient into the room for instructions and discussion She is feeling much better, c/w  cough No pain at site We discussed wound care and activity instructions CXR is without ptx this morning Transmitter was sent with the pt to the room post procedure. Post implant follow up is in place  EP will sign off though remain available, please recall if needed.  PLEASE REMOVE tegaderm dressing day of discharge, leave steri strips in place    2. COVID + Has felt poorly for a few weeks with worsened SOB Multiple comborbidities COPD DM, obesity IM team is managing   For questions or updates, please contact [facility] Please consult [email or web address] for contact info under        Signed, Baldwin [place],  PA-C  06/13/2021, 9:24 AM

## 2021-06-13 NOTE — Progress Notes (Signed)
Subjective:   Hospital day: 2   Overnight event: Successful placement of pacemaker yesterday. No acute events overnight heart rate in the 60s overnight.  Patient was evaluated at the bedside laying comfortably in bed. Patient states she is feeling much better. Her shortness of breath has resolved. She denies any chest pain, palpitations or headaches. Patient does report feeling dizzy this morning when she got up.The dizziness has resolved since laying back down. Patient asked about going home today and was informed that we will need to monitor her respiratory status for at least 1 more day.  Objective:  Vital signs in last 24 hours: Vitals:   06/12/21 2023 06/13/21 0047 06/13/21 0456 06/13/21 0500  BP: (!) 121/47 (!) 117/49 (!) 107/50   Pulse: 73 70 60   Resp: 19 17    Temp: 98.6 F (37 C) 98.4 F (36.9 C) 98.5 F (36.9 C)   TempSrc: Oral Oral Oral   SpO2: 97% 100%    Weight:    84.1 kg  Height:        Filed Weights   06/12/21 0906 06/13/21 0500  Weight: 86.5 kg 84.1 kg     Intake/Output Summary (Last 24 hours) at 06/13/2021 0606 Last data filed at 06/13/2021 0300 Gross per 24 hour  Intake 155.89 ml  Output --  Net 155.89 ml   Net IO Since Admission: 747.47 mL [06/13/21 0606]  Recent Labs    06/12/21 1452 06/12/21 1702 06/12/21 2027  GLUCAP 179* 209* 159*     Pertinent Labs: CBC Latest Ref Rng & Units 06/13/2021 06/12/2021 06/11/2021  WBC 4.0 - 10.5 K/uL 10.5 6.2 9.5  Hemoglobin 12.0 - 15.0 g/dL 9.8(L) 11.0(L) 10.8(L)  Hematocrit 36.0 - 46.0 % 29.7(L) 33.3(L) 32.7(L)  Platelets 150 - 400 K/uL 223 182 215    CMP Latest Ref Rng & Units 06/13/2021 06/12/2021 06/11/2021  Glucose 70 - 99 mg/dL 138(H) 192(H) 146(H)  BUN 8 - 23 mg/dL 37(H) 29(H) 23  Creatinine 0.44 - 1.00 mg/dL 1.51(H) 1.66(H) 1.23(H)  Sodium 135 - 145 mmol/L 130(L) 134(L) 133(L)  Potassium 3.5 - 5.1 mmol/L 4.7 4.5 4.7  Chloride 98 - 111 mmol/L 92(L) 96(L) 98  CO2 22 - 32 mmol/L 26 24 27   Calcium  8.9 - 10.3 mg/dL 8.6(L) 9.1 9.2  Total Protein 6.5 - 8.1 g/dL 6.1(L) 6.6 6.8  Total Bilirubin 0.3 - 1.2 mg/dL 0.9 0.7 0.9  Alkaline Phos 38 - 126 U/L 72 82 90  AST 15 - 41 U/L 22 20 23   ALT 0 - 44 U/L 15 15 17     Imaging: DG Chest 2 View  Result Date: 06/13/2021 CLINICAL DATA:  Cardiac device in situ. EXAM: CHEST - 2 VIEW COMPARISON:  Prior chest x-ray same day. FINDINGS: Cardiac pacer noted with lead tips over the right atrium and right ventricle. Cardiomegaly. Bilateral interstitial prominence suggesting interstitial edema. Small right pleural effusion. No pneumothorax. IMPRESSION: 1. Cardiac pacer noted with lead tips over the right atrium and right ventricle. No pneumothorax. 2. Bilateral interstitial prominence suggesting interstitial edema. Small right pleural effusion. Electronically Signed   By: Marcello Moores  Register   On: 06/13/2021 06:56   EP PPM/ICD IMPLANT  Result Date: 06/12/2021 SURGEON:  Thompson Grayer, MD   PREPROCEDURE DIAGNOSIS:  Symptomatic complete heart block   POSTPROCEDURE DIAGNOSIS:  Symptomatic complete heart block    PROCEDURES:  1. Left upper extremity venography.  2. Pacemaker implantation.   INTRODUCTION:  Rhonda Santos is a 75 y.o. female with  a history of symptomatic complete heart block who presents today for pacemaker implantation.  The patient presents with COVID +, respiratory failure, and hypotension.  She is found to have initially mobitz II second degree AV block and subsequently complete heart block.   No reversible causes have been identified.  The patient therefore presents today for pacemaker implantation.  She was very ill upon presentation with a slow agonal escape rhythm in the 20s.  She was mentating but felt poorly.   DESCRIPTION OF PROCEDURE:  Informed written consent was obtained, and  the patient was brought to the electrophysiology lab in a fasting state.  The patient required no sedation for the procedure today.  The patients left chest was prepped and  draped in the usual sterile fashion by the EP lab staff. The skin overlying the left deltopectoral region was infiltrated with lidocaine for local analgesia.  A 4-cm incision was made over the left deltopectoral region.  A left subcutaneous pacemaker pocket was fashioned using a combination of sharp and blunt dissection. Electrocautery was required to assure hemostasis.  Left Upper Extremity Venography: A venogram of the left upper extremity was performed, which revealed a small left axillary vein, which emptied into a small left subclavian vein.  The small cephalic vein was small in size.  RA/RV Lead Placement: The left axillary vein was therefore cannulated using a micropuncture technique.   Through the left axillary vein, an Abbott Tendril MRI model [id number]- [id number] (serial number  [id number]) right atrial lead and an Abbott Tendril MRI model [id number]  (serial number  [id number]) right ventricular lead were advanced with fluoroscopic visualization into the right atrial appendage and right ventricular apical septal positions respectively.  Initial atrial lead P- waves measured 2.5 mV with impedance of 459 ohms and a threshold of 0.8 V at 0.5 msec.  Right ventricular lead R-waves could not be measured due to complete heart block.  She developed asystole during her procedure requiring urgent pacing.  Her RV lead impedance was 670 ohms with a threshold of 0.7 V at 0.5 msec.  Both leads were secured to the pectoralis fascia using #2-0 silk over the suture sleeves. Device Placement:  The leads were then connected to an Abbott Assurity MRI model [id number] (serial number [id number]) pacemaker.  The pocket was irrigated with copious gentamicin solution.  The pacemaker was then placed into the pocket.  The pocket was then closed in 2 layers with 2.0 Vicryl suture for the subcutaneous and subcuticular layers.  Steri-  Strips and a sterile dressing were then applied. EBL<26ml.  There were no early apparent complications. Permanent  Pacemaker Indication: Documented non-reversible symptomatic bradycardia due to second degree and/or third degree atrioventricular block.    CONCLUSIONS:  1. Successful implantation of an Abbott Assurity MRI conditional dual-chamber pacemaker for symptomatic complete heart block  2. No early apparent complications.       Thompson Grayer, MD 06/12/2021 4:33 PM    Physical Exam  General: Well-appearing elderly woman laying comfortably in bed. NAD. CV: RRR. No m/r/g.  Pulmonary: Lungs CTAB. Mild transmitted upper airway sounds but no wheezing or rales.  Abdominal: Soft.  Nontender. Mildly distended. Normal BS Extremities: 2+ distal pulses. Normal ROM Skin: Warm and dry. Neuro: Alert and oriented x3. Moves all extremities. Psych: Normal mood and affect.   Assessment/Plan: Rhonda Santos is a 75 y.o. female with hx of DM, HFpEF, Chronic pain on tramadol, COPD, HTN, Post-surgical hypothyroidism, HLD, HTN presenting w/ worsening shortness of breath and found  to have acute hypoxic respiratory failure 2/2 COVID pneumonia and bradycardia.   Active Problems:   Pneumonia due to COVID-19 virus   COVID-19   COVID-19 virus infection  Acute hypoxic respiratory failure 2/2 COVID pneumonia COPD exacerbation Acute on chronic diastolic heart failure Repeat CXR today showed pacemaker in appropriate position with some bilateral interstitial edema and small right pleural effusion but no pneumothorax. Patient doing very well today. States her shortness of breath has improved. Turned down O2 to 1 L and patient able to maintain O2 sat above 95%. Inflammatory markers remain relatively stable. D-dimer elevated to 12.63. Mag and Phos within normal limits.   --Continue dexamethasone 6 mg (day 3/10) --Continue Incruse, prn Combivent --PRN Robitussin for cough --O2 prn to keep >90 --Trend inflammatory markers --SSI while on steroids   Symptomatic 1st-degree AV block Patient found to be bradycardic with HR in the 30s  on admission. S/p pacemaker placement on 6/21. Repeat EKG showed dual paced rhythm w/ HR of 60.  Repeat CXR showed pacemaker in appropriate position. Patient report feeling dizzy after getting up this morning however her dizziness is now resolved. HR now in the 50s to 70s. --Cardiology (EP) following, appreciate recs --Continue telemetry --Continue to hold Lopressor --Checking orthostatic vital  Hypotension, resolved Blood pressure improved overnight with SBP in the 110s to 130s overnight. BP of 136/57 this morning. Patient reported some dizziness this morning upon standing up. --Daily vitals --F/u orthostatic vital   Post-surgical hypothyroidism Prior hx of thyroidectomy for Graves Disease. Admission TSH of 7.67 but free T4 and T3 remain within normal limits. Home levothyroxine of 100 mcg daily increased to 150 mcg in the setting of bradycardia and low-normal free T3.  --Continue levothyroxine 150 mcg daily   T2DM On metformin, glimepiride at home. CBG shows range from 120s to 200s.  Overnight CBG of 159. Lunch time CBG of 132. Continue to monitor closely while on steroids. --NovoLog 6 units TID w/ meals --Continue SSI, resistant --CBG checks   Iron deficiency Anemia Prior hx of iron deficiency anemia. Prior hgb ~8.2-8.4. Slight drop in hemoglobin from 11.0-->9.8 this morning. No signs of active bleeding. --Daily CBC   Anxiety/Depression Chronic and stable.   --Continue clonazepam 0.5 mg daily prn for anxiety --Continue on Cymbalta 60 mg daily before lunch   Chronic pain syndrome --Continue home gabapentin and tramadol --Continue Norco 5-325 mg q4h prn for moderate pain   Diet: HH/CM IVF: None VTE: SQ Lovenox CODE: DNR  Prior to Admission Living Arrangement: Home Anticipated Discharge Location: Home Barriers to Discharge: Respiratory status Dispo: Anticipated discharge tomorrow  Signed: Lacinda Axon, MD 06/13/2021, 6:06 AM  Pager: [phone] Internal Medicine  Teaching Service After 5pm on weekdays and 1pm on weekends: On Call pager: [phone]

## 2021-06-13 NOTE — Plan of Care (Signed)

## 2021-06-13 NOTE — Discharge Instructions (Addendum)
  Ms. Mulroy,  It was a pleasure taking care of you at [place] were admitted for covid19 infection and low heart rate. We treated your covid with steroids and inhaler. For your low heart rate, a pacemaker was placed in your heart, which has helped improve your heart rate. We are discharging you home now that you are doing better. Please follow the following instructions.  1) Follow-up with cardiology on 06/27/21 @ 10:00 AM for wound check visit 2) Continue to isolate at home until 06/19/21, 10 days from the day you started having symptoms 3) Schedule a hospital follow up with your PCP after your isolation ends.   Take care,  Dr. Linwood Dibbles, MD, MPH  Supplemental Discharge Instructions for  Pacemaker/Defibrillator Patients   Activity No heavy lifting or vigorous activity with your left arm for 6 to 8 weeks.  Do not raise your left arm above your head for one week.  Gradually raise your affected arm as drawn below.             06/20/21                   06/21/21                    06/22/21                    7/2 22 __  NO DRIVING the patient no longer drives.  WOUND CARE Keep the wound area clean and dry.  Do not get this area wet , no showers until cleared to at your wound check visit . The tape/steri-strips on your wound will fall off; do not pull them off.  No bandage is needed on the site.  DO  NOT apply any creams, oils, or ointments to the wound area. If you notice any drainage or discharge from the wound, any swelling or bruising at the site, or you develop a fever > 101? F after you are discharged home, call the office at once.  Special Instructions You are still able to use cellular telephones; use the ear opposite the side where you have your pacemaker/defibrillator.  Avoid carrying your cellular phone near your device. When traveling through airports, show security personnel your identification card to avoid being screened in the metal detectors.  Ask the security  personnel to use the hand wand. Avoid arc welding equipment, MRI testing (magnetic resonance imaging), TENS units (transcutaneous nerve stimulators).  Call the office for questions about other devices. Avoid electrical appliances that are in poor condition or are not properly grounded. Microwave ovens are safe to be near or to operate.

## 2021-06-14 LAB — CBC WITH DIFFERENTIAL/PLATELET
Abs Immature Granulocytes: 0.03 10*3/uL (ref 0.00–0.07)
Basophils Absolute: 0 10*3/uL (ref 0.0–0.1)
Basophils Relative: 0 %
Eosinophils Absolute: 0 10*3/uL (ref 0.0–0.5)
Eosinophils Relative: 0 %
HCT: 28.1 % — ABNORMAL LOW (ref 36.0–46.0)
Hemoglobin: 9.4 g/dL — ABNORMAL LOW (ref 12.0–15.0)
Immature Granulocytes: 0 %
Lymphocytes Relative: 17 %
Lymphs Abs: 1.2 10*3/uL (ref 0.7–4.0)
MCH: 31.5 pg (ref 26.0–34.0)
MCHC: 33.5 g/dL (ref 30.0–36.0)
MCV: 94.3 fL (ref 80.0–100.0)
Monocytes Absolute: 0.7 10*3/uL (ref 0.1–1.0)
Monocytes Relative: 9 %
Neutro Abs: 5.2 10*3/uL (ref 1.7–7.7)
Neutrophils Relative %: 74 %
Platelets: 162 10*3/uL (ref 150–400)
RBC: 2.98 MIL/uL — ABNORMAL LOW (ref 3.87–5.11)
RDW: 14.3 % (ref 11.5–15.5)
WBC: 7 10*3/uL (ref 4.0–10.5)
nRBC: 0 % (ref 0.0–0.2)

## 2021-06-14 LAB — COMPREHENSIVE METABOLIC PANEL
ALT: 13 U/L (ref 0–44)
AST: 18 U/L (ref 15–41)
Albumin: 2.7 g/dL — ABNORMAL LOW (ref 3.5–5.0)
Alkaline Phosphatase: 72 U/L (ref 38–126)
Anion gap: 6 (ref 5–15)
BUN: 25 mg/dL — ABNORMAL HIGH (ref 8–23)
CO2: 28 mmol/L (ref 22–32)
Calcium: 8.7 mg/dL — ABNORMAL LOW (ref 8.9–10.3)
Chloride: 98 mmol/L (ref 98–111)
Creatinine, Ser: 1.18 mg/dL — ABNORMAL HIGH (ref 0.44–1.00)
GFR, Estimated: 48 mL/min — ABNORMAL LOW (ref >60)
Glucose, Bld: 130 mg/dL — ABNORMAL HIGH (ref 70–99)
Potassium: 4.8 mmol/L (ref 3.5–5.1)
Sodium: 132 mmol/L — ABNORMAL LOW (ref 135–145)
Total Bilirubin: 0.6 mg/dL (ref 0.3–1.2)
Total Protein: 6.2 g/dL — ABNORMAL LOW (ref 6.5–8.1)

## 2021-06-14 LAB — GLUCOSE, CAPILLARY
Glucose-Capillary: 130 mg/dL — ABNORMAL HIGH (ref 70–99)
Glucose-Capillary: 187 mg/dL — ABNORMAL HIGH (ref 70–99)

## 2021-06-14 LAB — FERRITIN: Ferritin: 77 ng/mL (ref 11–307)

## 2021-06-14 LAB — MAGNESIUM: Magnesium: 2.3 mg/dL (ref 1.7–2.4)

## 2021-06-14 LAB — PHOSPHORUS: Phosphorus: 3.4 mg/dL (ref 2.5–4.6)

## 2021-06-14 LAB — D-DIMER, QUANTITATIVE: D-Dimer, Quant: 0.83 ug/mL-FEU — ABNORMAL HIGH (ref 0.00–0.50)

## 2021-06-14 LAB — C-REACTIVE PROTEIN: CRP: 5.5 mg/dL — ABNORMAL HIGH (ref <1.0)

## 2021-06-14 MED ORDER — ALBUTEROL SULFATE HFA 108 (90 BASE) MCG/ACT IN AERS: 2.0000 | INHALATION_SPRAY | Freq: Four times a day (QID) | RESPIRATORY_TRACT | 0 refills | Status: AC | PRN

## 2021-06-14 MED ORDER — CLONAZEPAM 0.5 MG PO TABS
0.5000 mg | ORAL_TABLET | Freq: Two times a day (BID) | ORAL | Status: DC | PRN
  Administered 2021-06-14: 0.5 mg via ORAL
  Filled 2021-06-14: qty 1

## 2021-06-14 MED FILL — Lidocaine HCl Local Inj 1%: INTRAMUSCULAR | Qty: 80 | Status: AC

## 2021-06-14 MED FILL — Cefazolin Sodium-Dextrose IV Solution 2 GM/100ML-4%: INTRAVENOUS | Qty: 100 | Status: AC

## 2021-06-14 NOTE — Discharge Summary (Signed)
Name: Rhonda Santos MRN: [id number] DOB: 1946-03-18 75 y.o. PCP: Vidal Schwalbe, MD  Date of Admission: 06/11/2021  6:42 AM Date of Discharge: 06/14/2021 Attending Physician: Lucious Groves, DO  Discharge Diagnosis: 1.  COVID-19 pneumonia 2.  First-degree AV block  Discharge Medications: Allergies as of 06/14/2021       Reactions   Aspirin Other (See Comments)   Burns line in stomach        Medication List     STOP taking these medications    doxycycline 100 MG capsule Commonly known as: VIBRAMYCIN   metoprolol tartrate 25 MG tablet Commonly known as: LOPRESSOR   Tiotropium Bromide-Olodaterol 2.5-2.5 MCG/ACT Aers       TAKE these medications    albuterol (2.5 MG/3ML) 0.083% nebulizer solution Commonly known as: PROVENTIL Take 2.5 mg by nebulization every 4 (four) hours as needed for wheezing or shortness of breath.   albuterol 108 (90 Base) MCG/ACT inhaler Commonly known as: VENTOLIN HFA Inhale 2 puffs into the lungs every 6 (six) hours as needed for wheezing or shortness of breath.   aspirin 325 MG tablet Take 325 mg by mouth once.   atorvastatin 40 MG tablet Commonly known as: LIPITOR Take 40 mg by mouth every morning.   azelastine 0.1 % nasal spray Commonly known as: ASTELIN Place 1 spray into both nostrils daily as needed for rhinitis or allergies.   clonazePAM 0.5 MG tablet Commonly known as: KLONOPIN Take 0.5 mg by mouth 2 (two) times daily.   diclofenac sodium 1 % Gel Commonly known as: VOLTAREN Apply 1 application topically 3 (three) times daily as needed (pain).   diphenhydrAMINE 25 MG tablet Commonly known as: BENADRYL Take 25 mg by mouth at bedtime as needed for sleep.   DULoxetine 60 MG capsule Commonly known as: CYMBALTA Take 60 mg by mouth every morning.   ferrous sulfate 325 (65 FE) MG tablet Take 1 tablet (325 mg total) by mouth 3 (three) times daily with meals. What changed: when to take this   fluticasone 50 MCG/ACT  nasal spray Commonly known as: FLONASE Place 1 spray into both nostrils daily as needed for allergies or rhinitis.   furosemide 40 MG tablet Commonly known as: LASIX Take 40 mg by mouth every morning.   gabapentin 600 MG tablet Commonly known as: NEURONTIN Take 600 mg by mouth 3 (three) times daily.   glimepiride 1 MG tablet Commonly known as: AMARYL Take 1 mg by mouth every morning.   levothyroxine 150 MCG tablet Commonly known as: SYNTHROID Take 150 mcg by mouth daily before breakfast. What changed: Another medication with the same name was removed. Continue taking this medication, and follow the directions you see here.   losartan 50 MG tablet Commonly known as: COZAAR Take 50 mg by mouth every morning.   metFORMIN 500 MG tablet Commonly known as: GLUCOPHAGE Take 500 mg by mouth 2 (two) times daily.   omeprazole 40 MG capsule Commonly known as: PRILOSEC Take 40 mg by mouth daily as needed (heartburn/ acid reflux).   potassium chloride 10 MEQ tablet Commonly known as: KLOR-CON Take 10 mEq by mouth 2 (two) times daily.   traMADol 50 MG tablet Commonly known as: ULTRAM Take 50 mg by mouth every 6 (six) hours as needed (pain).   Vitamin D (Ergocalciferol) 1.25 MG (50000 UNIT) Caps capsule Commonly known as: DRISDOL Take 50,000 Units by mouth every Monday.        Disposition and follow-up:   Rhonda Santos was discharged from [facility] in Stable condition.  At the hospital follow up visit please address:  1.  COVID-19 pneumonia: Shortness of breath has resolved and patient had no O2 requirement at discharge. Please ensure patient completed isolation on 6/28.  2.  First-degree AV block: Pacemaker placement on 6/21. Patient ensure patient goes to appointment with cardiology in July.  2.  Labs / imaging needed at time of follow-up: BMP  3.  Pending labs/ test needing follow-up: None  Follow-up Appointments:  Follow-up Information      Thompson Grayer, MD Follow up.   Specialty: Cardiology Why: 09/24/21 @ 1:45PM Contact information: [place] [id number] [phone]         Shirley Friar, PA-C Follow up.   Specialty: Physician Assistant Why: 06/27/21 @ 10:00AM, wound check visit Contact information: [place] [address] [phone]                 Hospital Course by problem list: 1. Acute hypoxic respiratory failure 2/2 COVID pneumonia COPD exacerbation Acute on chronic diastolic heart failure Patient presented w/ tachypnea, cough and respiratory distress.  Oxygen level dropped to 88 during examination so patient was put on 2 L [place]. She was found to have significant wheezing with accessory muscle use. Patient tested positive for COVID-19.CXR showed some left bibasilar opacities. Patient was started on steroids, inhalers and remdesivir. The remdesivir was discontinued after 1 dose due to worsening bradycardia.  Inflammatory markers remain relatively stable.  Patient's respiratory status improved with steroids and inhalers. Patient was transitioned to room air on day 2 of hospitalization. Shortness of breath had resolved by day of discharge. Patient received a total of 3 doses of steroids before discharge. Patient was instructed to isolate at home until June 28th and follow-up with PCP for hospital follow-up appointment.   Symptomatic 1st Degree Heart Block Patient found to be bradycardic with heart rate in the 30s on admission. EKG showed first-degree AV block. Patient remained asymptomatic. There was a mild increase in troponin from 38-55. Cardiology (electrophysiologist) was consulted and a pacemaker was placed on 6/21. Heart rate improved to the 60s to 70s. Repeat EKG showed dual paced rhythm with heart rate of 60. Repeat CXR showed pacemaker in appropriate position. Patient reported some dizziness upon standing the day after pacemaker placement.  Orthostatics were negative and dizziness resolved by day of discharge. Patient's home Lopressor was held during hospitalization and discontinued at discharge until patient sees cardiology. Patient is scheduled for follow-up with cardiology on July 6th.  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 Admit TSH elevated at 7.67 but free T4 and free T3 were within normal limits. Her home synthroid was increased to 150 mcg and she was discharged on this dose.    T2DM On metformin, glimepiride at home. She was started on sliding scale insulin but her blood sugar remained high in the 130s to 190s due to the steroid treatments for her covid infection.    Iron deficiency Anemia Patient had a prior hx of iron deficiency anemia. On oral iron supplements at home. Hemoglobin was 10.8 on admission. We held her iron supplements and resumed at discharge. Her hemoglobin at discharge was 9.4.   Anxiety/Depression Chronic pain syndrome We continued her home gabapentin, tramadol, clonazepam and duloxetine.    Subjective:  Patient was evaluated at bedside laying comfortably in bed. She has been off the oxygen since yesterday.  States her shortness of breath has resolved. She has some occasional nonproductive cough but that has improved. She denies any fevers, chills, abdominal pain or chest pain. Patient states she was happy about leaving today.    Discharge Exam:   BP (!) 147/62 (BP Location: Right Arm)   Pulse 72   Temp 98.1 F (36.7 C) (Oral)   Resp (!) 22   Ht 5\' 5"  (1.651 m)   Wt 82.8 kg   SpO2 95%   BMI 30.39 kg/m  General: Well-appearing elderly woman laying comfortably in bed. NAD. CV: RRR. No m/r/g. No LE edema Pulmonary: Lungs CTAB. No wheezing or rales. Mild transmitted upper airway sounds.  Normal effort. Abdominal: Soft.  Nontender. Mildly distended. Normal BS Extremities: 2+ distal pulses. Normal ROM Skin: Warm and dry.  No obvious lesions or rashes. Neuro:  Alert and oriented x3. Moves all extremities. Psych: Excited about going home today  Pertinent Labs, Studies, and Procedures:  CBC Latest Ref Rng & Units 06/14/2021 06/13/2021 06/12/2021  WBC 4.0 - 10.5 K/uL 7.0 10.5 6.2  Hemoglobin 12.0 - 15.0 g/dL 9.4(L) 9.8(L) 11.0(L)  Hematocrit 36.0 - 46.0 % 28.1(L) 29.7(L) 33.3(L)  Platelets 150 - 400 K/uL 162 223 182   CMP Latest Ref Rng & Units 06/14/2021 06/13/2021 06/12/2021  Glucose 70 - 99 mg/dL 130(H) 138(H) 192(H)  BUN 8 - 23 mg/dL 25(H) 37(H) 29(H)  Creatinine 0.44 - 1.00 mg/dL 1.18(H) 1.51(H) 1.66(H)  Sodium 135 - 145 mmol/L 132(L) 130(L) 134(L)  Potassium 3.5 - 5.1 mmol/L 4.8 4.7 4.5  Chloride 98 - 111 mmol/L 98 92(L) 96(L)  CO2 22 - 32 mmol/L 28 26 24   Calcium 8.9 - 10.3 mg/dL 8.7(L) 8.6(L) 9.1  Total Protein 6.5 - 8.1 g/dL 6.2(L) 6.1(L) 6.6  Total Bilirubin 0.3 - 1.2 mg/dL 0.6 0.9 0.7  Alkaline Phos 38 - 126 U/L 72 72 82  AST 15 - 41 U/L 18 22 20   ALT 0 - 44 U/L 13 15 15    EP PPM/ICD IMPLANT  Result Date: 06/12/2021 SURGEON:  Thompson Grayer, MD   PREPROCEDURE DIAGNOSIS:  Symptomatic complete heart block   POSTPROCEDURE DIAGNOSIS:  Symptomatic complete heart block    PROCEDURES:  1. Left upper extremity venography.  2. Pacemaker implantation.   INTRODUCTION:  LURLIE WIGEN is a 75 y.o. female with a history of symptomatic complete heart block who presents today for pacemaker implantation.  The patient presents with COVID +, respiratory failure, and hypotension.  She is found to have initially mobitz II second degree AV block and subsequently complete heart block.   No reversible causes have been identified.  The patient therefore presents today for pacemaker implantation.  She was very ill upon presentation with a slow agonal escape rhythm in the 20s.  She was mentating but felt poorly.   DESCRIPTION OF PROCEDURE:  Informed written consent was obtained, and  the patient was brought to the electrophysiology lab in a fasting state.  The patient  required no sedation for the procedure today.  The patients left chest was prepped and draped in the usual sterile fashion by the EP lab staff. The skin overlying the left deltopectoral region was infiltrated with lidocaine for local analgesia.  A 4-cm incision was made over the left deltopectoral region.  A left subcutaneous pacemaker pocket was fashioned using a combination of sharp and blunt dissection. Electrocautery was required to assure hemostasis.  Left Upper Extremity Venography: A venogram of the left upper extremity was performed, which revealed  a small left axillary vein, which emptied into a small left subclavian vein.  The small cephalic vein was small in size.  RA/RV Lead Placement: The left axillary vein was therefore cannulated using a micropuncture technique.   Through the left axillary vein, an Abbott Tendril MRI model [id number]- [id number] (serial number  [id number]) right atrial lead and an Abbott Tendril MRI model [id number]  (serial number  [id number]) right ventricular lead were advanced with fluoroscopic visualization into the right atrial appendage and right ventricular apical septal positions respectively.  Initial atrial lead P- waves measured 2.5 mV with impedance of 459 ohms and a threshold of 0.8 V at 0.5 msec.  Right ventricular lead R-waves could not be measured due to complete heart block.  She developed asystole during her procedure requiring urgent pacing.  Her RV lead impedance was 670 ohms with a threshold of 0.7 V at 0.5 msec.  Both leads were secured to the pectoralis fascia using #2-0 silk over the suture sleeves. Device Placement:  The leads were then connected to an Abbott Assurity MRI model [id number] (serial number [id number]) pacemaker.  The pocket was irrigated with copious gentamicin solution.  The pacemaker was then placed into the pocket.  The pocket was then closed in 2 layers with 2.0 Vicryl suture for the subcutaneous and subcuticular layers.  Steri-  Strips and a sterile dressing  were then applied. EBL<31ml.  There were no early apparent complications. Permanent Pacemaker Indication: Documented non-reversible symptomatic bradycardia due to second degree and/or third degree atrioventricular block.    CONCLUSIONS:  1. Successful implantation of an Abbott Assurity MRI conditional dual-chamber pacemaker for symptomatic complete heart block  2. No early apparent complications.       Thompson Grayer, MD 06/12/2021 4:33 PM   DG Chest Portable 1 View  Result Date: 06/11/2021 CLINICAL DATA:  Shortness of breath. EXAM: PORTABLE CHEST 1 VIEW COMPARISON:  08/31/2020. FINDINGS: Mildly enlarged cardiac silhouette, similar to priors. Linear left basilar opacities, similar to priors. Chronic medial right basilar opacity correlates with prominent diaphragmatic/mediastinal fat on prior CT. No new consolidation. No visible pneumothorax or pleural effusion. No acute osseous abnormality. IMPRESSION: 1. Linear left basilar opacities, similar to priors and most likely atelectasis/scar. 2. Similar cardiomegaly. Electronically Signed   By: Margaretha Sheffield MD   On: 06/11/2021 07:30     Discharge Instructions: Rhonda Santos,  It was a pleasure taking care of you at [place] were admitted for covid19 infection and low heart rate. We treated your covid with steroids and inhaler. For your low heart rate, a pacemaker was placed in your heart, which has helped improve your heart rate. We are discharging you home now that you are doing better. Please follow the following instructions.  1) Follow-up with cardiology on 06/27/21 @ 10:00 AM for wound check visit 2) Continue to isolate at home until 06/19/21, 10 days from the day you started having symptoms 3) Schedule a hospital follow up with your PCP after your isolation ends.   Take care,  Dr. Linwood Dibbles, MD, MPH  Signed: Lacinda Axon, MD 06/14/2021, 6:33 AM   Pager: [phone]

## 2021-06-15 ENCOUNTER — Telehealth: Payer: Self-pay

## 2021-06-15 NOTE — Telephone Encounter (Signed)
-----   Message from [place], [place] sent at 06/15/2021 10:06 AM EDT ----- A couple things for her...  She had pacer on Tuesday was COVID + so we did not ask industry to check her next day. She went home yesterday  Can you please follow up with her and check a transmission make sure all is OK and make sure the tegaderm is off  Please and thank you  renee

## 2021-06-15 NOTE — Telephone Encounter (Signed)
Follow-up after same day discharge: Implant date: 06/12/21 MD: Thompson Grayer, MD Device: St. Jude PPM Assurity MRI Location: TBD   Wound check visit: 06/27/21 90 day MD follow-up: 09/24/21  Remote Transmission received:TBD  Dressing removed: TBD  Attempted to contact patient about follow up from PPM implant. No answer, LMTCB.

## 2021-06-19 NOTE — Telephone Encounter (Signed)
LMOVM for patient to send a transmission with her home remote monitor.

## 2021-06-20 ENCOUNTER — Telehealth: Payer: Self-pay

## 2021-06-20 NOTE — Telephone Encounter (Signed)
3rd attempt to contact patient.   Certified letter sent.

## 2021-06-22 ENCOUNTER — Ambulatory Visit: Payer: Medicare PPO | Admitting: Internal Medicine

## 2021-06-22 NOTE — Telephone Encounter (Signed)
Patient's daughter returning call. Please advise

## 2021-06-26 ENCOUNTER — Telehealth: Payer: Self-pay

## 2021-06-26 NOTE — Telephone Encounter (Signed)
Patient daughter called in stating that she has gotten a certified letter about her monitor. She states that her monitor is plugged in by her bed and states it is working. I let her know that on our end it looks like its not working. She will call tech support when her mom wakes up to see what the issue is.

## 2021-06-27 ENCOUNTER — Encounter: Payer: Medicare PPO | Admitting: Student

## 2021-07-04 NOTE — Telephone Encounter (Signed)
Attempted to return phone call to patient. Patient was same day d/c that we were unable to get in touch with. Daughter is not on DPR and phone number is the same as patient.

## 2021-07-05 NOTE — Telephone Encounter (Signed)
Attempted to contact patient again. No answer, LMTCB.

## 2021-07-06 ENCOUNTER — Telehealth: Payer: Self-pay

## 2021-07-06 NOTE — Telephone Encounter (Addendum)
Spoke with patient's daughter, advised there is no DPR on file for her.  Pt will need to complete one.  General information provided.    Questioned why pt did not come for wound check on 7/5.  She states pt has too many doctors appts so she took the patient to her PCP.  Per daughter, the PCP removed steri-strips.  She indicated she has continued to sponge bath pt as she did not recive any instruction on how to care for site.  Advised this information is provided at the wound check appointment along with education for remote monitoring.  Pt current;y scheduled for f/u appt with Dr. Rayann Heman on 8/17.  Advised she can send a picture in via my chart for [place] to at least assess incision.  She stated she doesn't want to do that.    Stressed importance of follow-up for proper device functioning and wound care.

## 2021-07-06 NOTE — Telephone Encounter (Signed)
Patient and her daughter called in stating she keeps getting calls from [place] and she states she has sent in manual transmissions. I have walked her through how to send a transmission and it has been sent successfully

## 2021-08-08 ENCOUNTER — Ambulatory Visit: Payer: Medicare PPO | Admitting: Internal Medicine

## 2021-08-08 ENCOUNTER — Other Ambulatory Visit: Payer: Self-pay

## 2021-08-08 VITALS — BP 138/50 | HR 85 | Ht 64.0 in | Wt 164.4 lb

## 2021-08-08 DIAGNOSIS — I441 Atrioventricular block, second degree: Secondary | ICD-10-CM | POA: Diagnosis not present

## 2021-08-08 NOTE — Patient Instructions (Addendum)
Medication Instructions:  Your physician recommends that you continue on your current medications as directed. Please refer to the Current Medication list given to you today.  Labwork: None ordered.  Testing/Procedures: None ordered.  Follow-Up: Your physician wants you to follow-up in: 12 months with Thompson Grayer, MD or one of the following Advanced Practice Providers on your designated Care Team:    Tommye Standard, PA-C    You will receive a reminder letter in the mail two months in advance. If you don't receive a letter, please call our office to schedule the follow-up appointment.  Remote monitoring is used to monitor your Pacemaker from home. This monitoring reduces the number of office visits required to check your device to one time per year. It allows [place] to keep an eye on the functioning of your device to ensure it is working properly. You are scheduled for a device check from home on 09/12/21. You may send your transmission at any time that day. If you have a wireless device, the transmission will be sent automatically. After your physician reviews your transmission, you will receive a postcard with your next transmission date.  Any Other Special Instructions Will Be Listed Below (If Applicable).  If you need a refill on your cardiac medications before your next appointment, please call your pharmacy.

## 2021-08-08 NOTE — Progress Notes (Signed)
PCP: Vidal Schwalbe, MD   Primary EP:  Rhonda Santos is a 75 y.o. female who presents today for routine electrophysiology followup.  Since her pacemaker implant, the patient reports doing reasonably well.  She has ongoing SOB due to COPD.  She continues to smoke heavily.  She is not very active.  Today, she denies symptoms of palpitations, chest pain, lower extremity edema, dizziness, presyncope, or syncope.  The patient is otherwise without complaint today.   Past Medical History:  Diagnosis Date   Anemia 09/2016   Cirrhosis ([place]) 08/2015   Fatty liver seen on imaging of 2014.  Lifetime teetotaler.   DDD (degenerative disc disease)    With chronic pain   Diabetes mellitus without complication ([place])    Diastolic dysfunction 99991111   Gallstone 2014   Incidental finding, no complications.   Hyperlipidemia    Hypertension    Hypokalemia 10/06/2013   Secondary to lasix   Hypoxia 10/07/2013   From mild resp depression from opioids   Mitral valve prolapse 09/2013   Mild per echo   Opioid intoxication delirium ([place]) 2015   Unintentional overdose with acute psychoses   Osteoporosis    Prolonged Q-T interval on ECG 10/07/2013   Secondary to hypokalemia   Pulmonary embolism ([place]) 2016   Started on Xarelto   Thyroid disease    Initially hyperthyroid, question Graves' disease, underwent thyroidectomy and subsequently hypothyroid   Tobacco abuse 10/07/2013   Past Surgical History:  Procedure Laterality Date   COLONOSCOPY N/A 09/26/2016   Procedure: COLONOSCOPY;  Surgeon: Mauri Pole, MD;  Location: [facility];  Service: Endoscopy;  Laterality: N/A;   COLONOSCOPY N/A 09/27/2016   Procedure: COLONOSCOPY;  Surgeon: Mauri Pole, MD;  Location: [facility];  Service: Endoscopy;  Laterality: N/A;   ESOPHAGOGASTRODUODENOSCOPY N/A 09/26/2016   Procedure: ESOPHAGOGASTRODUODENOSCOPY (EGD);  Surgeon: Mauri Pole, MD;  Location: [facility] END[facility]COPY;  Service:  Endoscopy;  Laterality: N/A;   PACEMAKER IMPLANT N/A 06/12/2021   Procedure: PACEMAKER IMPLANT;  Surgeon: Thompson Grayer, MD;  Location: [place] [facility];  Service: Cardiovascular;  Laterality: N/A;   THYROID SURGERY      ROS- all systems are reviewed and negative except as per HPI above  Current Outpatient Medications  Medication Sig Dispense Refill   albuterol (PROVENTIL) (2.5 MG/3ML) 0.083% nebulizer solution Take 2.5 mg by nebulization every 4 (four) hours as needed for wheezing or shortness of breath.      albuterol (VENTOLIN HFA) 108 (90 Base) MCG/ACT inhaler Inhale 2 puffs into the lungs every 6 (six) hours as needed for wheezing or shortness of breath. 6.7 g 0   aspirin 325 MG tablet Take 325 mg by mouth once.     atorvastatin (LIPITOR) 40 MG tablet Take 40 mg by mouth every morning.     azelastine (ASTELIN) 0.1 % nasal spray Place 1 spray into both nostrils daily as needed for rhinitis or allergies.     clonazePAM (KLONOPIN) 0.5 MG tablet Take 0.5 mg by mouth 2 (two) times daily.     COMBIVENT RESPIMAT 20-100 MCG/ACT AERS respimat Inhale 1 puff into the lungs every 6 (six) hours.     diclofenac sodium (VOLTAREN) 1 % GEL Apply 1 application topically 3 (three) times daily as needed (pain).      diphenhydrAMINE (BENADRYL) 25 MG tablet Take 25 mg by mouth at bedtime as needed for sleep.     DULoxetine (CYMBALTA) 60 MG capsule Take 60 mg by  mouth every morning.     fluticasone (FLONASE) 50 MCG/ACT nasal spray Place 1 spray into both nostrils daily as needed for allergies or rhinitis.     furosemide (LASIX) 40 MG tablet Take 40 mg by mouth every morning.     gabapentin (NEURONTIN) 600 MG tablet Take 600 mg by mouth 3 (three) times daily.     glimepiride (AMARYL) 1 MG tablet Take 1 mg by mouth every morning.     hydrOXYzine (VISTARIL) 50 MG capsule Take 50 mg by mouth at bedtime.     levothyroxine (SYNTHROID) 150 MCG tablet Take 150 mcg by mouth daily before breakfast.     losartan  (COZAAR) 50 MG tablet Take 50 mg by mouth every morning.     metFORMIN (GLUCOPHAGE) 500 MG tablet Take 500 mg by mouth 2 (two) times daily.     omeprazole (PRILOSEC) 40 MG capsule Take 40 mg by mouth daily as needed (heartburn/ acid reflux).      potassium chloride (K-DUR) 10 MEQ tablet Take 10 mEq by mouth 2 (two) times daily.      traMADol (ULTRAM) 50 MG tablet Take 50 mg by mouth every 6 (six) hours as needed (pain).     Vitamin D, Ergocalciferol, (DRISDOL) 50000 UNITS CAPS capsule Take 50,000 Units by mouth every Monday.      ferrous sulfate 325 (65 FE) MG tablet Take 1 tablet (325 mg total) by mouth 3 (three) times daily with meals. (Patient taking differently: Take 325 mg by mouth 2 (two) times daily with a meal.) 90 tablet 0   No current facility-administered medications for this visit.    Physical Exam: Vitals:   08/08/21 1445  BP: (!) 138/50  Pulse: 85  SpO2: 94%  Weight: 164 lb 6.4 oz (74.6 kg)  Height: '5\' 4"'$  (1.626 m)    GEN- The patient is chronically ill  appearing, alert and oriented x 3 today.   Head- normocephalic, atraumatic Eyes-  Sclera clear, conjunctiva pink Ears- hearing intact Oropharynx- clear Lungs- Clear to ausculation bilaterally, normal work of breathing Chest- pacemaker pocket is well healed Heart- Regular rate and rhythm, no murmurs, rubs or gallops, PMI not laterally displaced GI- soft, NT, ND, + BS Extremities- no clubbing, cyanosis, or edema Fingers are stained from chronic tobacco.  Pacemaker interrogation- reviewed in detail today,  See PACEART report  ekg tracing ordered today is personally reviewed and shows sinus with V pacing  Assessment and Plan:  1. Symptomatic second degree heart block Normal pacemaker function See Pace Art report VIP is turned on today to promote intrinsic conduction she is not device dependant today  2. Chronic diastolic dysfunction Euvolemic today Sodium restriction is advised  3. HTN BP is  improved Continue cozaar '50mg'$  daily  4. HL Continue lipitor '40mg'$  daily  5. Tobacco Cessation is advised  Risks, benefits and potential toxicities for medications prescribed and/or refilled reviewed with patient today.   Thompson Grayer MD, [facility] 08/08/2021 3:01 PM

## 2021-09-12 ENCOUNTER — Ambulatory Visit (INDEPENDENT_AMBULATORY_CARE_PROVIDER_SITE_OTHER): Payer: Medicare PPO

## 2021-09-12 DIAGNOSIS — I441 Atrioventricular block, second degree: Secondary | ICD-10-CM

## 2021-09-12 LAB — CUP PACEART REMOTE DEVICE CHECK
Battery Remaining Longevity: 130 mo
Battery Remaining Percentage: 95.5 %
Battery Voltage: 3.02 V
Brady Statistic AP VP Percent: 1.3 %
Brady Statistic AP VS Percent: 1 %
Brady Statistic AS VP Percent: 45 %
Brady Statistic AS VS Percent: 54 %
Brady Statistic RA Percent Paced: 1.2 %
Brady Statistic RV Percent Paced: 46 %
Date Time Interrogation Session: 20220921020015
Implantable Lead Implant Date: 20220621
Implantable Lead Implant Date: 20220621
Implantable Lead Location: 753859
Implantable Lead Location: 753860
Implantable Pulse Generator Implant Date: 20220621
Lead Channel Impedance Value: 410 Ohm
Lead Channel Impedance Value: 640 Ohm
Lead Channel Pacing Threshold Amplitude: 0.5 V
Lead Channel Pacing Threshold Amplitude: 0.75 V
Lead Channel Pacing Threshold Pulse Width: 0.5 ms
Lead Channel Pacing Threshold Pulse Width: 0.5 ms
Lead Channel Sensing Intrinsic Amplitude: 3.9 mV
Lead Channel Sensing Intrinsic Amplitude: 4.2 mV
Lead Channel Setting Pacing Amplitude: 1 V
Lead Channel Setting Pacing Amplitude: 1.5 V
Lead Channel Setting Pacing Pulse Width: 0.5 ms
Lead Channel Setting Sensing Sensitivity: 2.5 mV
Pulse Gen Model: 2272
Pulse Gen Serial Number: 3933434

## 2021-09-19 NOTE — Progress Notes (Signed)
Remote pacemaker transmission.   

## 2021-09-24 ENCOUNTER — Encounter: Payer: Medicare PPO | Admitting: Internal Medicine

## 2021-09-24 DIAGNOSIS — I441 Atrioventricular block, second degree: Secondary | ICD-10-CM

## 2022-03-13 ENCOUNTER — Ambulatory Visit (INDEPENDENT_AMBULATORY_CARE_PROVIDER_SITE_OTHER): Payer: Medicare PPO

## 2022-03-13 DIAGNOSIS — I441 Atrioventricular block, second degree: Secondary | ICD-10-CM | POA: Diagnosis not present

## 2022-03-14 LAB — CUP PACEART REMOTE DEVICE CHECK
Battery Remaining Longevity: 120 mo
Battery Remaining Percentage: 95 %
Battery Voltage: 3.01 V
Brady Statistic AP VP Percent: 13 %
Brady Statistic AP VS Percent: 1 %
Brady Statistic AS VP Percent: 77 %
Brady Statistic AS VS Percent: 9.2 %
Brady Statistic RA Percent Paced: 12 %
Brady Statistic RV Percent Paced: 91 %
Date Time Interrogation Session: 20230322020015
Implantable Lead Implant Date: 20220621
Implantable Lead Implant Date: 20220621
Implantable Lead Location: 753859
Implantable Lead Location: 753860
Implantable Pulse Generator Implant Date: 20220621
Lead Channel Impedance Value: 350 Ohm
Lead Channel Impedance Value: 600 Ohm
Lead Channel Pacing Threshold Amplitude: 0.5 V
Lead Channel Pacing Threshold Amplitude: 0.5 V
Lead Channel Pacing Threshold Pulse Width: 0.5 ms
Lead Channel Pacing Threshold Pulse Width: 0.5 ms
Lead Channel Sensing Intrinsic Amplitude: 3 mV
Lead Channel Sensing Intrinsic Amplitude: 3.9 mV
Lead Channel Setting Pacing Amplitude: 0.75 V
Lead Channel Setting Pacing Amplitude: 1.5 V
Lead Channel Setting Pacing Pulse Width: 0.5 ms
Lead Channel Setting Sensing Sensitivity: 2.5 mV
Pulse Gen Model: 2272
Pulse Gen Serial Number: 3933434

## 2022-03-27 NOTE — Progress Notes (Signed)
Remote pacemaker transmission.   

## 2022-06-12 ENCOUNTER — Ambulatory Visit (INDEPENDENT_AMBULATORY_CARE_PROVIDER_SITE_OTHER): Payer: Medicare PPO

## 2022-06-12 DIAGNOSIS — I441 Atrioventricular block, second degree: Secondary | ICD-10-CM

## 2022-06-14 LAB — CUP PACEART REMOTE DEVICE CHECK
Battery Remaining Longevity: 117 mo
Battery Remaining Percentage: 93 %
Battery Voltage: 3.01 V
Brady Statistic AP VP Percent: 15 %
Brady Statistic AP VS Percent: 1 %
Brady Statistic AS VP Percent: 78 %
Brady Statistic AS VS Percent: 6.5 %
Brady Statistic RA Percent Paced: 14 %
Brady Statistic RV Percent Paced: 93 %
Date Time Interrogation Session: 20230621020015
Implantable Lead Implant Date: 20220621
Implantable Lead Implant Date: 20220621
Implantable Lead Location: 753859
Implantable Lead Location: 753860
Implantable Pulse Generator Implant Date: 20220621
Lead Channel Impedance Value: 380 Ohm
Lead Channel Impedance Value: 640 Ohm
Lead Channel Pacing Threshold Amplitude: 0.625 V
Lead Channel Pacing Threshold Amplitude: 0.625 V
Lead Channel Pacing Threshold Pulse Width: 0.5 ms
Lead Channel Pacing Threshold Pulse Width: 0.5 ms
Lead Channel Sensing Intrinsic Amplitude: 3.3 mV
Lead Channel Sensing Intrinsic Amplitude: 3.9 mV
Lead Channel Setting Pacing Amplitude: 0.875
Lead Channel Setting Pacing Amplitude: 1.625
Lead Channel Setting Pacing Pulse Width: 0.5 ms
Lead Channel Setting Sensing Sensitivity: 2.5 mV
Pulse Gen Model: 2272
Pulse Gen Serial Number: 3933434

## 2022-06-24 NOTE — Progress Notes (Signed)
Remote pacemaker transmission.   

## 2022-09-11 ENCOUNTER — Ambulatory Visit (INDEPENDENT_AMBULATORY_CARE_PROVIDER_SITE_OTHER): Payer: Medicare PPO

## 2022-09-11 DIAGNOSIS — I441 Atrioventricular block, second degree: Secondary | ICD-10-CM | POA: Diagnosis not present

## 2022-09-11 LAB — CUP PACEART REMOTE DEVICE CHECK
Battery Remaining Longevity: 113 mo
Battery Remaining Percentage: 90 %
Battery Voltage: 3.01 V
Brady Statistic AP VP Percent: 15 %
Brady Statistic AP VS Percent: 1 %
Brady Statistic AS VP Percent: 80 %
Brady Statistic AS VS Percent: 5 %
Brady Statistic RA Percent Paced: 14 %
Brady Statistic RV Percent Paced: 95 %
Date Time Interrogation Session: 20230920020014
Implantable Lead Implant Date: 20220621
Implantable Lead Implant Date: 20220621
Implantable Lead Location: 753859
Implantable Lead Location: 753860
Implantable Pulse Generator Implant Date: 20220621
Lead Channel Impedance Value: 390 Ohm
Lead Channel Impedance Value: 590 Ohm
Lead Channel Pacing Threshold Amplitude: 0.625 V
Lead Channel Pacing Threshold Amplitude: 0.625 V
Lead Channel Pacing Threshold Pulse Width: 0.5 ms
Lead Channel Pacing Threshold Pulse Width: 0.5 ms
Lead Channel Sensing Intrinsic Amplitude: 3.6 mV
Lead Channel Sensing Intrinsic Amplitude: 7.5 mV
Lead Channel Setting Pacing Amplitude: 0.875
Lead Channel Setting Pacing Amplitude: 1.625
Lead Channel Setting Pacing Pulse Width: 0.5 ms
Lead Channel Setting Sensing Sensitivity: 2.5 mV
Pulse Gen Model: 2272
Pulse Gen Serial Number: 3933434

## 2022-09-24 NOTE — Progress Notes (Signed)
Remote pacemaker transmission.   

## 2022-10-29 ENCOUNTER — Telehealth: Payer: Self-pay | Admitting: Cardiovascular Disease

## 2022-10-29 NOTE — Telephone Encounter (Signed)
Advised that symptoms are not related to PPM.  Advised to follow up with PCP about concerns.

## 2022-10-29 NOTE — Telephone Encounter (Signed)
Pt's daughter is calling to get advice on patients current symptoms of "brain fog" or confusion and leg pain. She was wondering if there could be issues with her device and if this could cause these issues or if she should be concerned about possible stroke. States that PCP keeps telling her that her mother is fine, but she has concerns and would like to discuss this.

## 2022-11-11 ENCOUNTER — Encounter: Payer: Medicare PPO | Admitting: Cardiovascular Disease

## 2022-11-21 ENCOUNTER — Ambulatory Visit: Payer: Medicare PPO | Attending: Cardiovascular Disease | Admitting: Cardiovascular Disease

## 2022-11-21 ENCOUNTER — Encounter: Payer: Self-pay | Admitting: Cardiovascular Disease

## 2022-11-21 VITALS — BP 134/52 | HR 77 | Ht 64.0 in | Wt 153.6 lb

## 2022-11-21 DIAGNOSIS — R001 Bradycardia, unspecified: Secondary | ICD-10-CM | POA: Diagnosis not present

## 2022-11-21 NOTE — Progress Notes (Signed)
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  He Abbott pacemaker was placed in June of 2022.   She has ongoing SOB due to COPD.  She continues to smoke heavily.  She is not very active.    Today, she denies symptoms of palpitations, chest pain, lower extremity edema, dizziness, presyncope, or syncope. she has no device related complaints -- no new tenderness, drainage, redness.  The patient is otherwise without complaint today.   Past Medical History:  Diagnosis Date   Anemia 09/2016   Cirrhosis ([place]) 08/2015   Fatty liver seen on imaging of 2014.  Lifetime teetotaler.   DDD (degenerative disc disease)    With chronic pain   Diabetes mellitus without complication ([place])    Diastolic dysfunction 96/7591   Gallstone 2014   Incidental finding, no complications.   Hyperlipidemia    Hypertension    Hypokalemia 10/06/2013   Secondary to lasix   Hypoxia 10/07/2013   From mild resp depression from opioids   Mitral valve prolapse 09/2013   Mild per echo   Opioid intoxication delirium ([place]) 2015   Unintentional overdose with acute psychoses   Osteoporosis    Prolonged Q-T interval on ECG 10/07/2013   Secondary to hypokalemia   Pulmonary embolism ([place]) 2016   Started on Xarelto   Thyroid disease    Initially hyperthyroid, question Graves' disease, underwent thyroidectomy and subsequently hypothyroid   Tobacco abuse 10/07/2013   Past Surgical History:  Procedure Laterality Date   COLONOSCOPY N/A 09/26/2016   Procedure: COLONOSCOPY;  Surgeon: Mauri Pole, MD;  Location: [facility];  Service: Endoscopy;  Laterality: N/A;   COLONOSCOPY N/A 09/27/2016   Procedure: COLONOSCOPY;  Surgeon: Mauri Pole, MD;  Location: [facility];  Service: Endoscopy;  Laterality: N/A;   ESOPHAGOGASTRODUODENOSCOPY N/A 09/26/2016   Procedure: ESOPHAGOGASTRODUODENOSCOPY (EGD);  Surgeon:  Mauri Pole, MD;  Location: [facility] ENDOSCOPY;  Service: Endoscopy;  Laterality: N/A;   PACEMAKER IMPLANT N/A 06/12/2021   Procedure: PACEMAKER IMPLANT;  Surgeon: Thompson Grayer, MD;  Location: [place] [facility];  Service: Cardiovascular;  Laterality: N/A;   THYROID SURGERY      ROS- all systems are reviewed and negative except as per HPI above  Current Outpatient Medications  Medication Sig Dispense Refill   albuterol (PROVENTIL) (2.5 MG/3ML) 0.083% nebulizer solution Take 2.5 mg by nebulization every 4 (four) hours as needed for wheezing or shortness of breath.      albuterol (VENTOLIN HFA) 108 (90 Base) MCG/ACT inhaler Inhale 2 puffs into the lungs every 6 (six) hours as needed for wheezing or shortness of breath. 6.7 g 0   aspirin 325 MG tablet Take 325 mg by mouth once.     atorvastatin (LIPITOR) 40 MG tablet Take 40 mg by mouth every morning.     azelastine (ASTELIN) 0.1 % nasal spray Place 1 spray into both nostrils daily as needed for rhinitis or allergies.     clonazePAM (KLONOPIN) 0.5 MG tablet Take 0.5 mg by mouth 2 (two) times daily.     COMBIVENT RESPIMAT 20-100 MCG/ACT AERS respimat Inhale 1 puff into the lungs every 6 (six) hours.     diclofenac sodium (VOLTAREN) 1 % GEL Apply 1 application topically 3 (three) times daily as needed (pain).      diphenhydrAMINE (BENADRYL) 25 MG tablet Take 25 mg by mouth at bedtime as needed for  sleep.     DULoxetine (CYMBALTA) 60 MG capsule Take 60 mg by mouth every morning.     ferrous sulfate 325 (65 FE) MG tablet Take 1 tablet (325 mg total) by mouth 3 (three) times daily with meals. (Patient taking differently: Take 325 mg by mouth 2 (two) times daily with a meal.) 90 tablet 0   fluticasone (FLONASE) 50 MCG/ACT nasal spray Place 1 spray into both nostrils daily as needed for allergies or rhinitis.     furosemide (LASIX) 40 MG tablet Take 40 mg by mouth every morning.     gabapentin (NEURONTIN) 600 MG tablet Take 600 mg by mouth 3 (three)  times daily.     glimepiride (AMARYL) 1 MG tablet Take 1 mg by mouth every morning.     hydrOXYzine (VISTARIL) 50 MG capsule Take 50 mg by mouth at bedtime.     levothyroxine (SYNTHROID) 150 MCG tablet Take 150 mcg by mouth daily before breakfast.     losartan (COZAAR) 50 MG tablet Take 50 mg by mouth every morning.     metFORMIN (GLUCOPHAGE) 500 MG tablet Take 500 mg by mouth 2 (two) times daily.     omeprazole (PRILOSEC) 40 MG capsule Take 40 mg by mouth daily as needed (heartburn/ acid reflux).      potassium chloride (K-DUR) 10 MEQ tablet Take 10 mEq by mouth 2 (two) times daily.      traMADol (ULTRAM) 50 MG tablet Take 50 mg by mouth every 6 (six) hours as needed (pain).     Vitamin D, Ergocalciferol, (DRISDOL) 50000 UNITS CAPS capsule Take 50,000 Units by mouth every Monday.      No current facility-administered medications for this visit.    Physical Exam: There were no vitals filed for this visit.   GEN- The patient is chronically ill  appearing, alert and oriented x 3 today.   Chest- pacemaker pocket is well healed Heart- Regular rate and rhythm, no murmurs, rubs or gallops, PMI not laterally displaced GI- soft, NT, ND, + BS Extremities- no clubbing, cyanosis, or edema Fingers are stained from chronic tobacco.  Pacemaker interrogation- reviewed in detail today,  See PACEART report  ekg tracing ordered today is personally reviewed and shows sinus with V pacing  Assessment and Plan:  1. Symptomatic second degree heart block Normal pacemaker function See Claudia Desanctis Art report  she is not device dependant today  2. Chronic diastolic dysfunction Euvolemic today Sodium restriction is advised  3. HTN BP is controlled Continue cozaar '50mg'$  daily  4. HL Continue lipitor '40mg'$  daily  5. Tobacco Cessation is advised again  Risks, benefits and potential toxicities for medications prescribed and/or refilled reviewed with patient today.   Melida Quitter,  MD  11/21/2022 11:48 AM

## 2022-11-21 NOTE — Patient Instructions (Signed)
Medication Instructions:  Your physician recommends that you continue on your current medications as directed. Please refer to the Current Medication list given to you today.  *If you need a refill on your cardiac medications before your next appointment, please call your pharmacy*   Follow-Up: At [facility], you and your health needs are our priority.  As part of our continuing mission to provide you with exceptional heart care, we have created designated Provider Care Teams.  These Care Teams include your primary Cardiologist (physician) and Advanced Practice Providers (APPs -  Physician Assistants and Nurse Practitioners) who all work together to provide you with the care you need, when you need it.  Your next appointment:   1 year(s)  The format for your next appointment:   In Person  Provider:   You may see Augustus E Mealor, MD or one of the following Advanced Practice Providers on your designated Care Team:   Renee Ursuy, PA-C Michael "Andy" Tillery, PA-C    Important Information About Sugar       

## 2022-12-13 ENCOUNTER — Ambulatory Visit (INDEPENDENT_AMBULATORY_CARE_PROVIDER_SITE_OTHER): Payer: Medicare PPO

## 2022-12-13 DIAGNOSIS — I441 Atrioventricular block, second degree: Secondary | ICD-10-CM

## 2022-12-13 LAB — CUP PACEART REMOTE DEVICE CHECK
Battery Remaining Longevity: 111 mo
Battery Remaining Percentage: 88 %
Battery Voltage: 3.01 V
Brady Statistic AP VP Percent: 1.6 %
Brady Statistic AP VS Percent: 1 %
Brady Statistic AS VP Percent: 98 %
Brady Statistic AS VS Percent: 1 %
Brady Statistic RA Percent Paced: 1.4 %
Brady Statistic RV Percent Paced: 99 %
Date Time Interrogation Session: 20231222020014
Implantable Lead Connection Status: 753985
Implantable Lead Connection Status: 753985
Implantable Lead Implant Date: 20220621
Implantable Lead Implant Date: 20220621
Implantable Lead Location: 753859
Implantable Lead Location: 753860
Implantable Pulse Generator Implant Date: 20220621
Lead Channel Impedance Value: 360 Ohm
Lead Channel Impedance Value: 560 Ohm
Lead Channel Pacing Threshold Amplitude: 0.5 V
Lead Channel Pacing Threshold Amplitude: 0.625 V
Lead Channel Pacing Threshold Pulse Width: 0.5 ms
Lead Channel Pacing Threshold Pulse Width: 0.5 ms
Lead Channel Sensing Intrinsic Amplitude: 3.4 mV
Lead Channel Sensing Intrinsic Amplitude: 7.5 mV
Lead Channel Setting Pacing Amplitude: 0.75 V
Lead Channel Setting Pacing Amplitude: 1.625
Lead Channel Setting Pacing Pulse Width: 0.5 ms
Lead Channel Setting Sensing Sensitivity: 2.5 mV
Pulse Gen Model: 2272
Pulse Gen Serial Number: 3933434

## 2023-01-02 NOTE — Progress Notes (Signed)
Remote pacemaker transmission.   

## 2023-03-14 ENCOUNTER — Ambulatory Visit (INDEPENDENT_AMBULATORY_CARE_PROVIDER_SITE_OTHER): Payer: Medicare PPO

## 2023-03-14 DIAGNOSIS — I441 Atrioventricular block, second degree: Secondary | ICD-10-CM

## 2023-03-14 LAB — CUP PACEART REMOTE DEVICE CHECK
Battery Remaining Longevity: 106 mo
Battery Remaining Percentage: 85 %
Battery Voltage: 3.01 V
Brady Statistic AP VP Percent: 1.8 %
Brady Statistic AP VS Percent: 1 %
Brady Statistic AS VP Percent: 98 %
Brady Statistic AS VS Percent: 1 %
Brady Statistic RA Percent Paced: 1.6 %
Brady Statistic RV Percent Paced: 99 %
Date Time Interrogation Session: 20240322020017
Implantable Lead Connection Status: 753985
Implantable Lead Connection Status: 753985
Implantable Lead Implant Date: 20220621
Implantable Lead Implant Date: 20220621
Implantable Lead Location: 753859
Implantable Lead Location: 753860
Implantable Pulse Generator Implant Date: 20220621
Lead Channel Impedance Value: 360 Ohm
Lead Channel Impedance Value: 560 Ohm
Lead Channel Pacing Threshold Amplitude: 0.5 V
Lead Channel Pacing Threshold Amplitude: 0.625 V
Lead Channel Pacing Threshold Pulse Width: 0.5 ms
Lead Channel Pacing Threshold Pulse Width: 0.5 ms
Lead Channel Sensing Intrinsic Amplitude: 2.8 mV
Lead Channel Sensing Intrinsic Amplitude: 7.5 mV
Lead Channel Setting Pacing Amplitude: 0.75 V
Lead Channel Setting Pacing Amplitude: 1.625
Lead Channel Setting Pacing Pulse Width: 0.5 ms
Lead Channel Setting Sensing Sensitivity: 2.5 mV
Pulse Gen Model: 2272
Pulse Gen Serial Number: 3933434

## 2023-03-24 ENCOUNTER — Telehealth: Payer: Self-pay

## 2023-03-24 NOTE — Telephone Encounter (Signed)
Following alert received from [employer] received for Device alert for HVR Event occurred 3/29 @16 :10, EMG shows NSVT > 20 beats, rate 234. Route to triage .

## 2023-03-25 NOTE — Telephone Encounter (Signed)
Attempted to contact pt. No answer, no VM available.

## 2023-03-26 NOTE — Telephone Encounter (Signed)
3rd attempt to contact patient. Unable to leave a VM, no other numbers provided. Closing encounter

## 2023-04-15 NOTE — Progress Notes (Signed)
Remote pacemaker transmission.   

## 2023-06-13 ENCOUNTER — Ambulatory Visit: Payer: Medicare PPO

## 2023-06-13 DIAGNOSIS — I441 Atrioventricular block, second degree: Secondary | ICD-10-CM

## 2023-06-13 LAB — CUP PACEART REMOTE DEVICE CHECK
Battery Remaining Longevity: 105 mo
Battery Remaining Percentage: 83 %
Battery Voltage: 3.01 V
Brady Statistic AP VP Percent: 1.6 %
Brady Statistic AP VS Percent: 1 %
Brady Statistic AS VP Percent: 98 %
Brady Statistic AS VS Percent: 1 %
Brady Statistic RA Percent Paced: 1.2 %
Brady Statistic RV Percent Paced: 99 %
Date Time Interrogation Session: 20240621020013
Implantable Lead Connection Status: 753985
Implantable Lead Connection Status: 753985
Implantable Lead Implant Date: 20220621
Implantable Lead Implant Date: 20220621
Implantable Lead Location: 753859
Implantable Lead Location: 753860
Implantable Pulse Generator Implant Date: 20220621
Lead Channel Impedance Value: 390 Ohm
Lead Channel Impedance Value: 590 Ohm
Lead Channel Pacing Threshold Amplitude: 0.625 V
Lead Channel Pacing Threshold Amplitude: 0.625 V
Lead Channel Pacing Threshold Pulse Width: 0.5 ms
Lead Channel Pacing Threshold Pulse Width: 0.5 ms
Lead Channel Sensing Intrinsic Amplitude: 3.1 mV
Lead Channel Sensing Intrinsic Amplitude: 6.5 mV
Lead Channel Setting Pacing Amplitude: 0.875
Lead Channel Setting Pacing Amplitude: 1.625
Lead Channel Setting Pacing Pulse Width: 0.5 ms
Lead Channel Setting Sensing Sensitivity: 2.5 mV
Pulse Gen Model: 2272
Pulse Gen Serial Number: 3933434

## 2023-07-01 NOTE — Progress Notes (Signed)
Remote pacemaker transmission.   

## 2023-09-12 ENCOUNTER — Ambulatory Visit: Payer: Medicare PPO | Attending: Cardiovascular Disease

## 2023-09-12 DIAGNOSIS — I441 Atrioventricular block, second degree: Secondary | ICD-10-CM | POA: Diagnosis not present

## 2023-09-12 LAB — CUP PACEART REMOTE DEVICE CHECK
Battery Remaining Longevity: 101 mo
Battery Remaining Percentage: 80 %
Battery Voltage: 3.01 V
Brady Statistic AP VP Percent: 2.6 %
Brady Statistic AP VS Percent: 1 %
Brady Statistic AS VP Percent: 96 %
Brady Statistic AS VS Percent: 1 %
Brady Statistic RA Percent Paced: 1.8 %
Brady Statistic RV Percent Paced: 99 %
Date Time Interrogation Session: 20240920020012
Implantable Lead Connection Status: 753985
Implantable Lead Connection Status: 753985
Implantable Lead Implant Date: 20220621
Implantable Lead Implant Date: 20220621
Implantable Lead Location: 753859
Implantable Lead Location: 753860
Implantable Pulse Generator Implant Date: 20220621
Lead Channel Impedance Value: 360 Ohm
Lead Channel Impedance Value: 540 Ohm
Lead Channel Pacing Threshold Amplitude: 0.5 V
Lead Channel Pacing Threshold Amplitude: 0.625 V
Lead Channel Pacing Threshold Pulse Width: 0.5 ms
Lead Channel Pacing Threshold Pulse Width: 0.5 ms
Lead Channel Sensing Intrinsic Amplitude: 2.9 mV
Lead Channel Sensing Intrinsic Amplitude: 8.1 mV
Lead Channel Setting Pacing Amplitude: 0.75 V
Lead Channel Setting Pacing Amplitude: 1.625
Lead Channel Setting Pacing Pulse Width: 0.5 ms
Lead Channel Setting Sensing Sensitivity: 2.5 mV
Pulse Gen Model: 2272
Pulse Gen Serial Number: 3933434

## 2023-09-22 NOTE — Progress Notes (Signed)
Remote pacemaker transmission.   

## 2023-12-08 ENCOUNTER — Telehealth: Payer: Self-pay | Admitting: Cardiovascular Disease

## 2023-12-08 NOTE — Telephone Encounter (Signed)
Received a page around 9 PM on 12/08/2023 regarding Rhonda Santos.  Reached daughter, Juanda Chance.  Confirmed patient's name and date of birth.  For about 1 week now, has been noticing heart rates on pulse ox in the 30s.  Only temporary for a few seconds and then quickly rebounds to greater than 60 bpm per report.  No associated symptoms including dizziness/lightheadedness, dyspnea, chest.  Has a PPM with last device interrogation in 08/2023.  From a symptom standpoint, currently at her baseline with sporadic chest discomfort symptoms that are not new.  Patient's daughter also noticing that pulse ox will occasionally drop into the high 80s and quickly rebound.  Patient has a history of COPD and is reported to still be actively smoking.  Confirmed the patient feels at her baseline and the purpose of the call was to talk about the heart rate trends as above.  Dois Davenport felt comfortable with not needing immediate care via the emergency room.  She also added that her mother would decline going to the emergency room while feeling at her baseline right now.  We discussed device interrogation in the clinic to further assess the low heart rates that are being found on home pulse oximeter.  Will alert her outpatient cardiologist.  Discussed indications to present to the emergency room in the interim including sustained heart rates in the 30s especially with symptoms of dizziness/lightheadedness, syncope, chest discomfort, dyspnea.  Patient and daughter in agreement with this plan.  Fidela Juneau, MD

## 2023-12-09 ENCOUNTER — Telehealth: Payer: Self-pay

## 2023-12-09 NOTE — Telephone Encounter (Addendum)
Received sign-out from fellow regarding this pt, needs office to help assist with interrogation -> will route to device clinic to assist and relay back to EP team.

## 2023-12-09 NOTE — Telephone Encounter (Signed)
Pt daughter called back and I have walked her through to send in transmission and it is sent

## 2023-12-09 NOTE — Telephone Encounter (Signed)
LMTCB to get transmission

## 2023-12-09 NOTE — Telephone Encounter (Signed)
Please have patient send manual transmission when she returns call.

## 2023-12-09 NOTE — Telephone Encounter (Signed)
LMTCB

## 2023-12-09 NOTE — Telephone Encounter (Signed)
Device function WNL. VIP needs to be disabled at next IOV. Pt feels fine. Not an urgent change unless pt becomes symptomatic. O/D for f/u w/ Am. Sent to scheduling.

## 2023-12-10 LAB — CUP PACEART REMOTE DEVICE CHECK
Battery Remaining Longevity: 98 mo
Battery Remaining Percentage: 78 %
Battery Voltage: 3.01 V
Brady Statistic AP VP Percent: 4.1 %
Brady Statistic AP VS Percent: 1 %
Brady Statistic AS VP Percent: 94 %
Brady Statistic AS VS Percent: 1.5 %
Brady Statistic RA Percent Paced: 2.9 %
Brady Statistic RV Percent Paced: 98 %
Date Time Interrogation Session: 20241217123127
Implantable Lead Connection Status: 753985
Implantable Lead Connection Status: 753985
Implantable Lead Implant Date: 20220621
Implantable Lead Implant Date: 20220621
Implantable Lead Location: 753859
Implantable Lead Location: 753860
Implantable Pulse Generator Implant Date: 20220621
Lead Channel Impedance Value: 360 Ohm
Lead Channel Impedance Value: 560 Ohm
Lead Channel Pacing Threshold Amplitude: 0.5 V
Lead Channel Pacing Threshold Amplitude: 0.75 V
Lead Channel Pacing Threshold Pulse Width: 0.5 ms
Lead Channel Pacing Threshold Pulse Width: 0.5 ms
Lead Channel Sensing Intrinsic Amplitude: 3.3 mV
Lead Channel Sensing Intrinsic Amplitude: 6.9 mV
Lead Channel Setting Pacing Amplitude: 0.75 V
Lead Channel Setting Pacing Amplitude: 1.75 V
Lead Channel Setting Pacing Pulse Width: 0.5 ms
Lead Channel Setting Sensing Sensitivity: 2.5 mV
Pulse Gen Model: 2272
Pulse Gen Serial Number: 3933434

## 2023-12-12 ENCOUNTER — Ambulatory Visit (INDEPENDENT_AMBULATORY_CARE_PROVIDER_SITE_OTHER): Payer: Medicare PPO

## 2023-12-12 DIAGNOSIS — I441 Atrioventricular block, second degree: Secondary | ICD-10-CM

## 2023-12-31 NOTE — Progress Notes (Signed)
  Electrophysiology Office Note:   ID:  Rhonda Santos, Rhonda Santos 1946-07-06, MRN [id number]  Primary Cardiologist: None Electrophysiologist: Rhonda FORBES Furbish, MD      History of Present Illness:   Rhonda Santos is a 78 y.o. female with h/o Second degree AV block s/p PPM, HTN, diastolic CHF, HLD, and tobacco abuse seen today for routine electrophysiology followup.   Since last being seen in our clinic the patient reports doing well. Still smoking > 1 ppd. Has rare chest pressure, mostly when she gets upset, but at times seems exertional. Never lasts for more than a few minutes. No new SOB, but has chronic DOE with more than mild exertion. No edema or syncope.   Has had episodes of lightheadedness in the night with conflicting results on the pulse ox.   Review of systems complete and found to be negative unless listed in HPI.   EP Information / Studies Reviewed:    EKG is ordered today. Personal review as below.  EKG Interpretation Date/Time:  Thursday January 01 2024 11:59:08 EST Ventricular Rate:  66 PR Interval:  206 QRS Duration:  142 QT Interval:  474 QTC Calculation: 496 R Axis:   -72  Text Interpretation: AV dual-paced rhythm When compared with ECG of 13-Jun-2021 05:28, Vent. rate has increased BY   6 BPM Confirmed by Lesia Sharper [phone]) on 01/01/2024 12:14:27 PM    PPM Interrogation-  reviewed in detail today,  See PACEART report.  Device History: Abbott Dual Chamber PPM implanted 05/2021 for Second Degree AV block  Physical Exam:   VS:  BP 132/66   Pulse 66   Ht 5' 4 (1.626 m)   Wt 161 lb 9.6 oz (73.3 kg)   SpO2 99%   BMI 27.74 kg/m    Wt Readings from Last 3 Encounters:  01/01/24 161 lb 9.6 oz (73.3 kg)  11/21/22 153 lb 9.6 oz (69.7 kg)  08/08/21 164 lb 6.4 oz (74.6 kg)     GEN: No acute distress  NECK: No JVD; No carotid bruits CARDIAC: Regular rate and rhythm, no murmurs, rubs, gallops RESPIRATORY:  Diminished throughout.  ABDOMEN: Soft, non-tender,  non-distended EXTREMITIES:  No edema; No deformity   ASSESSMENT AND PLAN:    Second Degree AV block s/p Abbott PPM  Normal PPM function See Pace Art report No changes today Dependent today. Describes symptoms during testing at VVI 30 as similar to symptoms at home. Will turn VIP off on the off chance that it is causing symptoms. Not needed any more with dependence anyway.  \ Chronic diastolic CHF Volume status stable on exam.  HTN Stable on current regimen   HLD Continue statin   Tobacco abuse Continues to smoke ppd.   Chest discomfort Typical/atypical features.  With long smoking history and no plants to quit, will update echo and get Myoview.  Given NTG 0.4 mg to use as needed.   Disposition:   Follow up with EP APP in 6 months as placeholder. Sooner with gen cards if Echo/Myoview remarkable.   Signed, Sharper Prentice Lesia, PA-C

## 2024-01-01 ENCOUNTER — Ambulatory Visit: Payer: Medicare PPO | Attending: Student | Admitting: Student

## 2024-01-01 ENCOUNTER — Encounter: Payer: Self-pay | Admitting: Student

## 2024-01-01 ENCOUNTER — Encounter: Payer: Self-pay | Admitting: *Deleted

## 2024-01-01 VITALS — BP 132/66 | HR 66 | Ht 64.0 in | Wt 161.6 lb

## 2024-01-01 DIAGNOSIS — I5189 Other ill-defined heart diseases: Secondary | ICD-10-CM | POA: Diagnosis not present

## 2024-01-01 DIAGNOSIS — J441 Chronic obstructive pulmonary disease with (acute) exacerbation: Secondary | ICD-10-CM

## 2024-01-01 DIAGNOSIS — R079 Chest pain, unspecified: Secondary | ICD-10-CM

## 2024-01-01 DIAGNOSIS — R001 Bradycardia, unspecified: Secondary | ICD-10-CM

## 2024-01-01 DIAGNOSIS — I10 Essential (primary) hypertension: Secondary | ICD-10-CM

## 2024-01-01 DIAGNOSIS — I441 Atrioventricular block, second degree: Secondary | ICD-10-CM | POA: Diagnosis not present

## 2024-01-01 LAB — CUP PACEART INCLINIC DEVICE CHECK
Battery Remaining Longevity: 100 mo
Battery Voltage: 3.01 V
Brady Statistic RA Percent Paced: 4.2 %
Brady Statistic RV Percent Paced: 98 %
Date Time Interrogation Session: 20250109130351
Implantable Lead Connection Status: 753985
Implantable Lead Connection Status: 753985
Implantable Lead Implant Date: 20220621
Implantable Lead Implant Date: 20220621
Implantable Lead Location: 753859
Implantable Lead Location: 753860
Implantable Pulse Generator Implant Date: 20220621
Lead Channel Impedance Value: 362.5 Ohm
Lead Channel Impedance Value: 362.5 Ohm
Lead Channel Impedance Value: 575 Ohm
Lead Channel Impedance Value: 575 Ohm
Lead Channel Pacing Threshold Amplitude: 0.5 V
Lead Channel Pacing Threshold Amplitude: 0.5 V
Lead Channel Pacing Threshold Amplitude: 0.75 V
Lead Channel Pacing Threshold Amplitude: 0.75 V
Lead Channel Pacing Threshold Pulse Width: 0.5 ms
Lead Channel Pacing Threshold Pulse Width: 0.5 ms
Lead Channel Pacing Threshold Pulse Width: 0.5 ms
Lead Channel Pacing Threshold Pulse Width: 0.5 ms
Lead Channel Sensing Intrinsic Amplitude: 2.9 mV
Lead Channel Sensing Intrinsic Amplitude: 2.9 mV
Lead Channel Sensing Intrinsic Amplitude: 5.7 mV
Lead Channel Sensing Intrinsic Amplitude: 5.7 mV
Lead Channel Setting Pacing Amplitude: 0.75 V
Lead Channel Setting Pacing Amplitude: 1.75 V
Lead Channel Setting Pacing Pulse Width: 0.5 ms
Lead Channel Setting Sensing Sensitivity: 2.5 mV
Pulse Gen Model: 2272
Pulse Gen Serial Number: 3933434

## 2024-01-01 MED ORDER — NITROGLYCERIN 0.4 MG SL SUBL: 0.4000 mg | SUBLINGUAL_TABLET | SUBLINGUAL | 3 refills | Status: AC | PRN

## 2024-01-01 NOTE — Patient Instructions (Signed)
 Medication Instructions:  Start sublingual nitroglycerin  0.4 mg, one tablet under the tongue as needed for chest pain. *If you need a refill on your cardiac medications before your next appointment, please call your pharmacy*   Lab Work: None ordered If you have labs (blood work) drawn today and your tests are completely normal, you will receive your results only by: MyChart Message (if you have MyChart) OR A paper copy in the mail If you have any lab test that is abnormal or we need to change your treatment, we will call you to review the results.   Testing/Procedures: Your physician has requested that you have an echocardiogram. Echocardiography is a painless test that uses sound waves to create images of your heart. It provides your doctor with information about the size and shape of your heart and how well your heart's chambers and valves are working. This procedure takes approximately one hour. There are no restrictions for this procedure. Please do NOT wear cologne, perfume, aftershave, or lotions (deodorant is allowed). Please arrive 15 minutes prior to your appointment time.  Please note: We ask at that you not bring children with you during ultrasound (echo/ vascular) testing. Due to room size and safety concerns, children are not allowed in the ultrasound rooms during exams. Our front office staff cannot provide observation of children in our lobby area while testing is being conducted. An adult accompanying a patient to their appointment will only be allowed in the ultrasound room at the discretion of the ultrasound technician under special circumstances. We apologize for any inconvenience.   Your physician has requested that you have a lexiscan myoview. For further information please visit [email or web address] Please follow instruction sheet, as given.    Follow-Up: At [facility], you and your health needs are our priority.  As part of our continuing mission to provide  you with exceptional heart care, we have created designated Provider Care Teams.  These Care Teams include your primary Cardiologist (physician) and Advanced Practice Providers (APPs -  Physician Assistants and Nurse Practitioners) who all work together to provide you with the care you need, when you need it.  We recommend signing up for the patient portal called MyChart.  Sign up information is provided on this After Visit Summary.  MyChart is used to connect with patients for Virtual Visits (Telemedicine).  Patients are able to view lab/test results, encounter notes, upcoming appointments, etc.  Non-urgent messages can be sent to your provider as well.   To learn more about what you can do with MyChart, go to [email or web address].    Your next appointment:   6 month(s)  Provider:   Ozell Jodie Passey, PA-C

## 2024-01-02 NOTE — Addendum Note (Signed)
 Addended by: Maxine Glenn A on: 01/02/2024 08:19 AM   Modules accepted: Orders

## 2024-01-13 ENCOUNTER — Telehealth (HOSPITAL_COMMUNITY): Payer: Self-pay | Admitting: *Deleted

## 2024-01-13 NOTE — Progress Notes (Signed)
Remote pacemaker transmission.   

## 2024-01-13 NOTE — Telephone Encounter (Signed)
Left instructions for MPI study with number to nuclear dept if pt has questions.

## 2024-01-21 ENCOUNTER — Telehealth (HOSPITAL_COMMUNITY): Payer: Self-pay | Admitting: Student

## 2024-01-21 NOTE — Telephone Encounter (Signed)
Patients daughter called and states that patient does not want to have echo or Myoview and will not be coming to appointments. Pt does not want! Order will be removed from the echo/nuc WQ. Thank you

## 2024-01-22 ENCOUNTER — Ambulatory Visit (HOSPITAL_COMMUNITY): Payer: Medicare PPO

## 2024-01-22 ENCOUNTER — Other Ambulatory Visit (HOSPITAL_COMMUNITY): Payer: Medicare PPO

## 2024-03-12 ENCOUNTER — Ambulatory Visit: Payer: Medicare PPO

## 2024-03-12 DIAGNOSIS — I441 Atrioventricular block, second degree: Secondary | ICD-10-CM

## 2024-03-12 LAB — CUP PACEART REMOTE DEVICE CHECK
Battery Remaining Longevity: 94 mo
Battery Remaining Percentage: 75 %
Battery Voltage: 3.01 V
Brady Statistic AP VP Percent: 36 %
Brady Statistic AP VS Percent: 1 %
Brady Statistic AS VP Percent: 64 %
Brady Statistic AS VS Percent: 1 %
Brady Statistic RA Percent Paced: 34 %
Brady Statistic RV Percent Paced: 99 %
Date Time Interrogation Session: 20250321020013
Implantable Lead Connection Status: 753985
Implantable Lead Connection Status: 753985
Implantable Lead Implant Date: 20220621
Implantable Lead Implant Date: 20220621
Implantable Lead Location: 753859
Implantable Lead Location: 753860
Implantable Pulse Generator Implant Date: 20220621
Lead Channel Impedance Value: 400 Ohm
Lead Channel Impedance Value: 640 Ohm
Lead Channel Pacing Threshold Amplitude: 0.5 V
Lead Channel Pacing Threshold Amplitude: 0.625 V
Lead Channel Pacing Threshold Pulse Width: 0.5 ms
Lead Channel Pacing Threshold Pulse Width: 0.5 ms
Lead Channel Sensing Intrinsic Amplitude: 3.6 mV
Lead Channel Sensing Intrinsic Amplitude: 7.3 mV
Lead Channel Setting Pacing Amplitude: 0.75 V
Lead Channel Setting Pacing Amplitude: 1.625
Lead Channel Setting Pacing Pulse Width: 0.5 ms
Lead Channel Setting Sensing Sensitivity: 2.5 mV
Pulse Gen Model: 2272
Pulse Gen Serial Number: 3933434

## 2024-04-20 NOTE — Progress Notes (Signed)
 Remote pacemaker transmission.

## 2024-06-11 ENCOUNTER — Ambulatory Visit (INDEPENDENT_AMBULATORY_CARE_PROVIDER_SITE_OTHER): Payer: Medicare PPO

## 2024-06-11 DIAGNOSIS — I441 Atrioventricular block, second degree: Secondary | ICD-10-CM

## 2024-06-11 LAB — CUP PACEART REMOTE DEVICE CHECK
Battery Remaining Longevity: 89 mo
Battery Remaining Percentage: 73 %
Battery Voltage: 3.01 V
Brady Statistic AP VP Percent: 40 %
Brady Statistic AP VS Percent: 1 %
Brady Statistic AS VP Percent: 59 %
Brady Statistic AS VS Percent: 1 %
Brady Statistic RA Percent Paced: 39 %
Brady Statistic RV Percent Paced: 99 %
Date Time Interrogation Session: 20250620020014
Implantable Lead Connection Status: 753985
Implantable Lead Connection Status: 753985
Implantable Lead Implant Date: 20220621
Implantable Lead Implant Date: 20220621
Implantable Lead Location: 753859
Implantable Lead Location: 753860
Implantable Pulse Generator Implant Date: 20220621
Lead Channel Impedance Value: 400 Ohm
Lead Channel Impedance Value: 640 Ohm
Lead Channel Pacing Threshold Amplitude: 0.625 V
Lead Channel Pacing Threshold Amplitude: 0.625 V
Lead Channel Pacing Threshold Pulse Width: 0.5 ms
Lead Channel Pacing Threshold Pulse Width: 0.5 ms
Lead Channel Sensing Intrinsic Amplitude: 3.6 mV
Lead Channel Sensing Intrinsic Amplitude: 6.3 mV
Lead Channel Setting Pacing Amplitude: 0.875
Lead Channel Setting Pacing Amplitude: 1.625
Lead Channel Setting Pacing Pulse Width: 0.5 ms
Lead Channel Setting Sensing Sensitivity: 2.5 mV
Pulse Gen Model: 2272
Pulse Gen Serial Number: 3933434

## 2024-06-16 ENCOUNTER — Ambulatory Visit: Payer: Self-pay | Admitting: Cardiovascular Disease

## 2024-08-09 NOTE — Progress Notes (Signed)
 Remote pacemaker transmission.

## 2024-09-10 ENCOUNTER — Ambulatory Visit (INDEPENDENT_AMBULATORY_CARE_PROVIDER_SITE_OTHER): Payer: Medicare PPO

## 2024-09-10 DIAGNOSIS — I441 Atrioventricular block, second degree: Secondary | ICD-10-CM | POA: Diagnosis not present

## 2024-09-13 LAB — CUP PACEART REMOTE DEVICE CHECK
Battery Remaining Longevity: 86 mo
Battery Remaining Percentage: 70 %
Battery Voltage: 3.01 V
Brady Statistic AP VP Percent: 43 %
Brady Statistic AP VS Percent: 1 %
Brady Statistic AS VP Percent: 57 %
Brady Statistic AS VS Percent: 1 %
Brady Statistic RA Percent Paced: 42 %
Brady Statistic RV Percent Paced: 99 %
Date Time Interrogation Session: 20250919020012
Implantable Lead Connection Status: 753985
Implantable Lead Connection Status: 753985
Implantable Lead Implant Date: 20220621
Implantable Lead Implant Date: 20220621
Implantable Lead Location: 753859
Implantable Lead Location: 753860
Implantable Pulse Generator Implant Date: 20220621
Lead Channel Impedance Value: 400 Ohm
Lead Channel Impedance Value: 650 Ohm
Lead Channel Pacing Threshold Amplitude: 0.625 V
Lead Channel Pacing Threshold Amplitude: 0.625 V
Lead Channel Pacing Threshold Pulse Width: 0.5 ms
Lead Channel Pacing Threshold Pulse Width: 0.5 ms
Lead Channel Sensing Intrinsic Amplitude: 3.6 mV
Lead Channel Sensing Intrinsic Amplitude: 7.9 mV
Lead Channel Setting Pacing Amplitude: 0.875
Lead Channel Setting Pacing Amplitude: 1.625
Lead Channel Setting Pacing Pulse Width: 0.5 ms
Lead Channel Setting Sensing Sensitivity: 2.5 mV
Pulse Gen Model: 2272
Pulse Gen Serial Number: 3933434

## 2024-09-14 NOTE — Progress Notes (Signed)
 Remote PPM Transmission

## 2024-09-15 ENCOUNTER — Ambulatory Visit: Payer: Self-pay | Admitting: Cardiovascular Disease

## 2024-12-10 ENCOUNTER — Ambulatory Visit: Payer: Medicare PPO

## 2024-12-10 DIAGNOSIS — I441 Atrioventricular block, second degree: Secondary | ICD-10-CM

## 2024-12-12 LAB — CUP PACEART REMOTE DEVICE CHECK
Battery Remaining Longevity: 83 mo
Battery Remaining Percentage: 68 %
Battery Voltage: 3.01 V
Brady Statistic AP VP Percent: 42 %
Brady Statistic AP VS Percent: 1 %
Brady Statistic AS VP Percent: 58 %
Brady Statistic AS VS Percent: 1 %
Brady Statistic RA Percent Paced: 42 %
Brady Statistic RV Percent Paced: 99 %
Date Time Interrogation Session: 20251219020014
Implantable Lead Connection Status: 753985
Implantable Lead Connection Status: 753985
Implantable Lead Implant Date: 20220621
Implantable Lead Implant Date: 20220621
Implantable Lead Location: 753859
Implantable Lead Location: 753860
Implantable Pulse Generator Implant Date: 20220621
Lead Channel Impedance Value: 390 Ohm
Lead Channel Impedance Value: 600 Ohm
Lead Channel Pacing Threshold Amplitude: 0.625 V
Lead Channel Pacing Threshold Amplitude: 0.625 V
Lead Channel Pacing Threshold Pulse Width: 0.5 ms
Lead Channel Pacing Threshold Pulse Width: 0.5 ms
Lead Channel Sensing Intrinsic Amplitude: 3.4 mV
Lead Channel Sensing Intrinsic Amplitude: 7.9 mV
Lead Channel Setting Pacing Amplitude: 0.875
Lead Channel Setting Pacing Amplitude: 1.625
Lead Channel Setting Pacing Pulse Width: 0.5 ms
Lead Channel Setting Sensing Sensitivity: 2.5 mV
Pulse Gen Model: 2272
Pulse Gen Serial Number: 3933434

## 2024-12-13 NOTE — Progress Notes (Signed)
 Remote PPM Transmission

## 2024-12-22 ENCOUNTER — Ambulatory Visit: Payer: Self-pay | Admitting: Cardiovascular Disease
